# Patient Record
Sex: Female | Born: 1985 | Race: White | Hispanic: No | Marital: Single | Smoking: Never smoker
Health system: Southern US, Community
[De-identification: ages and names within clinical notes are randomized; demographics above are authoritative.]

## PROBLEM LIST (undated history)

## (undated) ENCOUNTER — Inpatient Hospital Stay (HOSPITAL_COMMUNITY): Payer: Self-pay

## (undated) DIAGNOSIS — I10 Essential (primary) hypertension: Secondary | ICD-10-CM

## (undated) DIAGNOSIS — R413 Other amnesia: Secondary | ICD-10-CM

## (undated) DIAGNOSIS — F419 Anxiety disorder, unspecified: Secondary | ICD-10-CM

## (undated) DIAGNOSIS — Q0701 Arnold-Chiari syndrome with spina bifida: Secondary | ICD-10-CM

## (undated) DIAGNOSIS — G479 Sleep disorder, unspecified: Secondary | ICD-10-CM

## (undated) DIAGNOSIS — F99 Mental disorder, not otherwise specified: Secondary | ICD-10-CM

## (undated) HISTORY — DX: Other amnesia: R41.3

## (undated) HISTORY — DX: Mental disorder, not otherwise specified: F99

## (undated) HISTORY — PX: MOUTH SURGERY: SHX715

## (undated) HISTORY — DX: Sleep disorder, unspecified: G47.9

## (undated) HISTORY — PX: KIDNEY SURGERY: SHX687

---

## 2003-09-22 ENCOUNTER — Emergency Department (HOSPITAL_COMMUNITY): Admission: EM | Admit: 2003-09-22 | Discharge: 2003-09-22 | Payer: Self-pay | Admitting: Emergency Medicine

## 2005-12-10 ENCOUNTER — Inpatient Hospital Stay (HOSPITAL_COMMUNITY): Admission: AD | Admit: 2005-12-10 | Discharge: 2005-12-12 | Payer: Self-pay | Admitting: Obstetrics & Gynecology

## 2005-12-11 ENCOUNTER — Encounter (INDEPENDENT_AMBULATORY_CARE_PROVIDER_SITE_OTHER): Payer: Self-pay | Admitting: Specialist

## 2005-12-25 ENCOUNTER — Emergency Department (HOSPITAL_COMMUNITY): Admission: EM | Admit: 2005-12-25 | Discharge: 2005-12-25 | Payer: Self-pay | Admitting: Emergency Medicine

## 2006-09-21 ENCOUNTER — Other Ambulatory Visit: Admission: RE | Admit: 2006-09-21 | Discharge: 2006-09-21 | Payer: Self-pay | Admitting: Obstetrics and Gynecology

## 2006-12-06 ENCOUNTER — Ambulatory Visit (HOSPITAL_COMMUNITY): Admission: RE | Admit: 2006-12-06 | Discharge: 2006-12-06 | Payer: Self-pay | Admitting: Obstetrics and Gynecology

## 2007-02-15 ENCOUNTER — Ambulatory Visit (HOSPITAL_COMMUNITY): Admission: RE | Admit: 2007-02-15 | Discharge: 2007-02-15 | Payer: Self-pay | Admitting: Obstetrics and Gynecology

## 2007-03-29 ENCOUNTER — Inpatient Hospital Stay (HOSPITAL_COMMUNITY): Admission: AD | Admit: 2007-03-29 | Discharge: 2007-03-29 | Payer: Self-pay | Admitting: Obstetrics and Gynecology

## 2007-04-19 ENCOUNTER — Ambulatory Visit (HOSPITAL_COMMUNITY): Admission: RE | Admit: 2007-04-19 | Discharge: 2007-04-19 | Payer: Self-pay | Admitting: Obstetrics and Gynecology

## 2007-04-21 ENCOUNTER — Inpatient Hospital Stay (HOSPITAL_COMMUNITY): Admission: AD | Admit: 2007-04-21 | Discharge: 2007-04-24 | Payer: Self-pay | Admitting: Obstetrics and Gynecology

## 2008-05-25 ENCOUNTER — Emergency Department (HOSPITAL_COMMUNITY): Admission: EM | Admit: 2008-05-25 | Discharge: 2008-05-25 | Payer: Self-pay | Admitting: Emergency Medicine

## 2008-12-01 ENCOUNTER — Emergency Department (HOSPITAL_COMMUNITY): Admission: EM | Admit: 2008-12-01 | Discharge: 2008-12-02 | Payer: Self-pay | Admitting: Emergency Medicine

## 2010-06-20 ENCOUNTER — Emergency Department (HOSPITAL_COMMUNITY): Payer: No Typology Code available for payment source

## 2010-06-20 ENCOUNTER — Emergency Department (HOSPITAL_COMMUNITY)
Admission: EM | Admit: 2010-06-20 | Discharge: 2010-06-20 | Disposition: A | Discharge status: Home / Self Care | Payer: No Typology Code available for payment source | Attending: Emergency Medicine | Admitting: Emergency Medicine

## 2010-06-20 DIAGNOSIS — S1093XA Contusion of unspecified part of neck, initial encounter: Secondary | ICD-10-CM | POA: Insufficient documentation

## 2010-06-20 DIAGNOSIS — M79609 Pain in unspecified limb: Secondary | ICD-10-CM | POA: Insufficient documentation

## 2010-06-20 DIAGNOSIS — M542 Cervicalgia: Secondary | ICD-10-CM | POA: Insufficient documentation

## 2010-06-20 DIAGNOSIS — M25519 Pain in unspecified shoulder: Secondary | ICD-10-CM | POA: Insufficient documentation

## 2010-06-20 DIAGNOSIS — Y92410 Unspecified street and highway as the place of occurrence of the external cause: Secondary | ICD-10-CM | POA: Insufficient documentation

## 2010-06-20 DIAGNOSIS — S0003XA Contusion of scalp, initial encounter: Secondary | ICD-10-CM | POA: Insufficient documentation

## 2010-07-09 LAB — URINALYSIS, ROUTINE W REFLEX MICROSCOPIC
Glucose, UA: NEGATIVE mg/dL
Hgb urine dipstick: NEGATIVE
Leukocytes, UA: NEGATIVE
pH: 6 (ref 5.0–8.0)

## 2010-07-09 LAB — BASIC METABOLIC PANEL
BUN: 10 mg/dL (ref 6–23)
CO2: 24 mEq/L (ref 19–32)
Calcium: 9.4 mg/dL (ref 8.4–10.5)
Creatinine, Ser: 0.93 mg/dL (ref 0.4–1.2)
GFR calc Af Amer: >60 mL/min (ref >60)
GFR calc non Af Amer: >60 mL/min (ref >60)
Glucose, Bld: 117 mg/dL — ABNORMAL HIGH (ref 70–99)
Potassium: 3.7 mEq/L (ref 3.5–5.1)

## 2010-07-09 LAB — CBC
RBC: 4.38 MIL/uL (ref 3.87–5.11)
WBC: 17.1 10*3/uL — ABNORMAL HIGH (ref 4.0–10.5)

## 2010-07-09 LAB — URINE MICROSCOPIC-ADD ON

## 2010-07-09 LAB — RAPID URINE DRUG SCREEN, HOSP PERFORMED
Amphetamines: NOT DETECTED
Barbiturates: NOT DETECTED
Benzodiazepines: NOT DETECTED
Cocaine: POSITIVE — AB

## 2010-07-09 LAB — DIFFERENTIAL
Monocytes Relative: 4 % (ref 3–12)
Neutro Abs: 15.3 10*3/uL — ABNORMAL HIGH (ref 1.7–7.7)

## 2010-07-09 LAB — POCT PREGNANCY, URINE: Preg Test, Ur: NEGATIVE

## 2010-11-22 ENCOUNTER — Other Ambulatory Visit: Payer: Self-pay | Admitting: Geriatric Medicine

## 2010-11-22 DIAGNOSIS — R102 Pelvic and perineal pain: Secondary | ICD-10-CM

## 2010-11-23 ENCOUNTER — Ambulatory Visit
Admission: RE | Admit: 2010-11-23 | Discharge: 2010-11-23 | Disposition: A | Discharge status: Home / Self Care | Payer: No Typology Code available for payment source | Source: Ambulatory Visit | Attending: Geriatric Medicine | Admitting: Geriatric Medicine

## 2010-11-23 ENCOUNTER — Other Ambulatory Visit: Payer: No Typology Code available for payment source

## 2010-11-23 ENCOUNTER — Ambulatory Visit
Admission: RE | Admit: 2010-11-23 | Discharge: 2010-11-23 | Disposition: A | Discharge status: Home / Self Care | Payer: Medicaid Other | Source: Ambulatory Visit | Attending: Geriatric Medicine | Admitting: Geriatric Medicine

## 2010-11-23 DIAGNOSIS — R102 Pelvic and perineal pain: Secondary | ICD-10-CM

## 2010-11-24 ENCOUNTER — Other Ambulatory Visit: Payer: No Typology Code available for payment source

## 2010-11-25 ENCOUNTER — Other Ambulatory Visit: Payer: No Typology Code available for payment source

## 2010-12-22 LAB — CBC
HCT: 35.2 — ABNORMAL LOW
HCT: 37.6
MCHC: 34.2
MCV: 97.9
Platelets: 220
RDW: 12.9

## 2011-04-09 ENCOUNTER — Emergency Department (HOSPITAL_COMMUNITY)
Admission: EM | Admit: 2011-04-09 | Discharge: 2011-04-09 | Disposition: A | Discharge status: Home / Self Care | Payer: Medicaid Other | Attending: Emergency Medicine | Admitting: Emergency Medicine

## 2011-04-09 DIAGNOSIS — M65849 Other synovitis and tenosynovitis, unspecified hand: Secondary | ICD-10-CM | POA: Insufficient documentation

## 2011-04-09 DIAGNOSIS — R6883 Chills (without fever): Secondary | ICD-10-CM | POA: Insufficient documentation

## 2011-04-09 DIAGNOSIS — M778 Other enthesopathies, not elsewhere classified: Secondary | ICD-10-CM

## 2011-04-09 DIAGNOSIS — F17200 Nicotine dependence, unspecified, uncomplicated: Secondary | ICD-10-CM | POA: Insufficient documentation

## 2011-04-09 DIAGNOSIS — M25539 Pain in unspecified wrist: Secondary | ICD-10-CM | POA: Insufficient documentation

## 2011-04-09 DIAGNOSIS — M65839 Other synovitis and tenosynovitis, unspecified forearm: Secondary | ICD-10-CM | POA: Insufficient documentation

## 2011-04-09 MED ORDER — PROMETHAZINE HCL 25 MG/ML IJ SOLN: 25.0000 mg | Freq: Once | INTRAMUSCULAR | Status: DC

## 2011-04-09 MED ORDER — KETOROLAC TROMETHAMINE 60 MG/2ML IM SOLN: 60.0000 mg | Freq: Once | INTRAMUSCULAR | Status: DC

## 2011-04-09 MED ORDER — OXYMETAZOLINE HCL 0.05 % NA SOLN: 2.0000 | Freq: Once | NASAL | Status: DC

## 2011-04-09 MED ORDER — PSEUDOEPHEDRINE HCL 60 MG PO TABS: 60.0000 mg | ORAL_TABLET | ORAL | Status: DC

## 2011-04-09 MED ORDER — NAPROXEN 500 MG PO TABS: 500.0000 mg | ORAL_TABLET | Freq: Two times a day (BID) | ORAL | Status: AC

## 2011-04-09 NOTE — ED Notes (Signed)
Pt in from home with stated left wrist pain denies recent injury states ongoing for several months no swelling noted

## 2011-04-09 NOTE — ED Notes (Signed)
Pt reports left wrist pain with extension. Pain increasing over a month. Pt reports picking up heavy items at work and had to leave early yesterday.  No memory of hurting wrist. Pt denies numbness. Pt denies taking medication for pain. Pt reports pain to dorsal hand and toward thumb.  No swelling at this time, but pt reports swelling is present on other days.

## 2011-04-09 NOTE — ED Provider Notes (Signed)
History     CSN: [id number]  Arrival date & time 04/09/11  1322   First MD Initiated Contact with Patient 04/09/11 1508      Chief Complaint  Patient presents with  . Wrist Pain    (Consider location/radiation/quality/duration/timing/severity/associated sxs/prior treatment) Patient is a 26 y.o. female presenting with wrist pain. The history is provided by the patient.  Wrist Pain This is a new problem. The current episode started 1 to 4 weeks ago. The problem occurs constantly. The problem has been gradually worsening. Associated symptoms include chills. Pertinent negatives include no abdominal pain, fever, joint swelling, myalgias, numbness or weakness. The symptoms are aggravated by bending and twisting. She has tried nothing for the symptoms.  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 States noted pain in her wrist about 2-3 weeks ago. States pain is worsening since then. Denies any known injury. States pain worsened with flexing wrist, extending wrist. NO swelling, redness noted.   History reviewed. No pertinent past medical history.  History reviewed. No pertinent past surgical history.  No family history on file.  History  Substance Use Topics  . Smoking status: Current Everyday Smoker  . Smokeless tobacco: Not on file  . Alcohol Use: No    OB History    Grav Para Term Preterm Abortions TAB SAB Ect Mult Living                  Review of Systems  Constitutional: Positive for chills. Negative for fever.  HENT: Negative.   Eyes: Negative.   Respiratory: Negative.   Cardiovascular: Negative.   Gastrointestinal: Negative for abdominal pain.  Genitourinary: Negative.   Musculoskeletal: Negative for myalgias and joint swelling.       Wrist pain  Skin: Negative.   Neurological: Negative for weakness and numbness.  Psychiatric/Behavioral: Negative.     Allergies  Review of patient's allergies indicates no known  allergies.  Home Medications  No current outpatient prescriptions on file.  BP 105/56  Pulse 97  Temp(Src) 99 F (37.2 C) (Oral)  Resp 20  SpO2 100%  LMP 04/09/2011  Physical Exam  Nursing note and vitals reviewed. Constitutional: She is oriented to person, place, and time. She appears well-nourished.  HENT:  Head: Normocephalic.  Eyes: Conjunctivae are normal.  Neck: Neck supple.  Cardiovascular: Normal rate, regular rhythm and normal heart sounds.   Pulmonary/Chest: Effort normal and breath sounds normal.  Musculoskeletal: She exhibits no edema.       Normal appearing left wrist. Tenderness with palpation over the dorsal wrist. Pain with flexion, extension of the wrist. Full ROM of the wrist. Normal strength. No pain with supination or pronation.   Neurological: She is alert and oriented to person, place, and time.  Skin: Skin is warm and dry.  Psychiatric: She has a normal mood and affect.    ED Course  Procedures (including critical care time)  Left wrist normal appearing, one area of tenderness over the dorsal wrist. Full ROM, no injury, no signs of infection. Do not think x-ray is helpful at this point. Suspect tendonitis vs gaglion cyst, which at this point i cannot feel. Will splint velcro, limit activity at home, ice, nsaids, follow up with hand ortho.   MDM          Lottie Mussel, PA 04/09/11 1541

## 2011-04-09 NOTE — ED Notes (Signed)
Ortho called for wrist splint 

## 2011-04-10 NOTE — ED Provider Notes (Signed)
Medical screening examination/treatment/procedure(s) were performed by non-physician practitioner and as supervising physician I was immediately available for consultation/collaboration.  Ezeriah Luty, MD 04/10/11 0118 

## 2011-11-26 ENCOUNTER — Encounter (HOSPITAL_COMMUNITY): Payer: Self-pay | Admitting: *Deleted

## 2011-11-26 ENCOUNTER — Emergency Department (HOSPITAL_COMMUNITY)
Admission: EM | Admit: 2011-11-26 | Discharge: 2011-11-27 | Disposition: A | Discharge status: Home / Self Care | Payer: Self-pay | Attending: Emergency Medicine | Admitting: Emergency Medicine

## 2011-11-26 DIAGNOSIS — R0602 Shortness of breath: Secondary | ICD-10-CM | POA: Insufficient documentation

## 2011-11-26 DIAGNOSIS — F17200 Nicotine dependence, unspecified, uncomplicated: Secondary | ICD-10-CM | POA: Insufficient documentation

## 2011-11-26 DIAGNOSIS — F411 Generalized anxiety disorder: Secondary | ICD-10-CM | POA: Insufficient documentation

## 2011-11-26 DIAGNOSIS — R079 Chest pain, unspecified: Secondary | ICD-10-CM | POA: Insufficient documentation

## 2011-11-26 HISTORY — DX: Anxiety disorder, unspecified: F41.9

## 2011-11-26 LAB — BASIC METABOLIC PANEL
Calcium: 9.2 mg/dL (ref 8.4–10.5)
Creatinine, Ser: 0.89 mg/dL (ref 0.50–1.10)
GFR calc Af Amer: >90 mL/min (ref >90)

## 2011-11-26 LAB — CBC
MCH: 32.7 pg (ref 26.0–34.0)
MCHC: 34 g/dL (ref 30.0–36.0)
MCV: 96.3 fL (ref 78.0–100.0)
Platelets: 283 10*3/uL (ref 150–400)
RDW: 11.9 % (ref 11.5–15.5)
WBC: 8.5 10*3/uL (ref 4.0–10.5)

## 2011-11-26 LAB — POCT I-STAT TROPONIN I: Troponin i, poc: 0 ng/mL (ref 0.00–0.08)

## 2011-11-26 NOTE — ED Notes (Signed)
Pt c/o shortness of breath and intermittent CP that resolved w/o intervention. Pt denies hx of lung and cardiac disease. Pt reports recently stopping anxiolytic due to auditory hallucinations.

## 2011-11-27 MED ORDER — ALPRAZOLAM 0.5 MG PO TABS: 0.5000 mg | ORAL_TABLET | Freq: Every evening | ORAL | Status: AC | PRN

## 2011-11-27 NOTE — ED Provider Notes (Signed)
History     CSN: [id number]  Arrival date & time 11/26/11  1946   First MD Initiated Contact with Patient 11/26/11 2355      Chief Complaint  Patient presents with  . Shortness of Breath    (Consider location/radiation/quality/duration/timing/severity/associated sxs/prior treatment) HPI Comments: 50 female with a history of anxiety who presents with a complaint of shortness of breath and chest pain. She states that she has been moving over the last several months and during this time as he multiple therapists and has been on different medications for anxiety most recently Zoloft. She states that she has been on this for 4 days after which time she developed auditory hallucinations prompting her to stop this medication. She has been off medication for several days and has had decreased hallucinations, no suicidal thoughts and no depression. Her anxiety has worsened over the last week with a stress of work and being a single parent. She states that around 6:00 PM she developed shortness of breath, sharp chest pain which has since essentially resolved. She has no travel, no trauma, no oral contraceptives or other hormone therapy, no swelling of the lower extremities, no fevers, no coughing, no back pain. Her symptoms at this time it resolved, she still has anxiety and feels nervous but denies depression or suicidal thoughts. She has no history of pulmonary motion, no family history of venous embolism.  Patient is a 26 y.o. female presenting with shortness of breath. The history is provided by the patient.  Shortness of Breath  Associated symptoms include shortness of breath.    Past Medical History  Diagnosis Date  . Anxiety     History reviewed. No pertinent past surgical history.  History reviewed. No pertinent family history.  History  Substance Use Topics  . Smoking status: Current Everyday Smoker    Types: Cigarettes  . Smokeless tobacco: Not on file  . Alcohol Use: No    OB  History    Grav Para Term Preterm Abortions TAB SAB Ect Mult Living                  Review of Systems  Respiratory: Positive for shortness of breath.   All other systems reviewed and are negative.    Allergies  Review of patient's allergies indicates no known allergies.  Home Medications   Current Outpatient Rx  Name Route Sig Dispense Refill  . ALPRAZOLAM 0.5 MG PO TABS Oral Take 1 tablet (0.5 mg total) by mouth at bedtime as needed for sleep. 5 tablet 0  . NAPROXEN 500 MG PO TABS Oral Take 1 tablet (500 mg total) by mouth 2 (two) times daily. 30 tablet 0    BP 102/71  Pulse 80  Temp 98.3 F (36.8 C) (Oral)  Resp 20  SpO2 100%  LMP 11/12/2011  Physical Exam  Nursing note and vitals reviewed. Constitutional: She appears well-developed and well-nourished. No distress.  HENT:  Head: Normocephalic and atraumatic.  Mouth/Throat: Oropharynx is clear and moist. No oropharyngeal exudate.  Eyes: Conjunctivae and EOM are normal. Pupils are equal, round, and reactive to light. Right eye exhibits no discharge. Left eye exhibits no discharge. No scleral icterus.  Neck: Normal range of motion. Neck supple. No JVD present. No thyromegaly present.  Cardiovascular: Normal rate, regular rhythm, normal heart sounds and intact distal pulses.  Exam reveals no gallop and no friction rub.   No murmur heard. Pulmonary/Chest: Effort normal and breath sounds normal. No respiratory distress. She has no wheezes.  She has no rales.  Abdominal: Soft. Bowel sounds are normal. She exhibits no distension and no mass. There is no tenderness.  Musculoskeletal: Normal range of motion. She exhibits no edema and no tenderness.  Lymphadenopathy:    She has no cervical adenopathy.  Neurological: She is alert. Coordination normal.  Skin: Skin is warm and dry. No rash noted. No erythema.  Psychiatric: Her behavior is normal.       Patient appears anxious, mildly tearful, denies depression or suicidal  thoughts    ED Course  Procedures (including critical care time)  Labs Reviewed  BASIC METABOLIC PANEL - Abnormal; Notable for the following:    Glucose, Bld 61 (*)     GFR calc non Af Amer 87 (*)     All other components within normal limits  CBC  POCT I-STAT TROPONIN I   No results found.   1. Shortness of breath   2. Chest pain       MDM  Strong peripheral pulses, clear heart and lung sounds, soft abdomen and no peripheral edema. Her pulse is 75 on my exam, she has a saturation of 100% an EKG without any abnormal significant findings. There is no old EKG with which to compare. She is extremely low risk for thromboembolism, her symptoms are likely related to her underlying behavioral health problems, she is amenable to referral both to community physicians and therapists and will give small amount of Xanax for home. The patient has verbally contracted with me take is a safe manner. She has no history of substance abuse and is not currently drink alcohol or use any drugs of abuse.   ED ECG REPORT  I personally interpreted this EKG   Date: 11/27/2011   Rate: 81  Rhythm: normal sinus rhythm  QRS Axis: right  Intervals: normal  ST/T Wave abnormalities: normal  Conduction Disutrbances:none  Narrative Interpretation:   Old EKG Reviewed: none available         Vida Roller, MD 11/27/11 0025

## 2011-11-27 NOTE — ED Notes (Signed)
Pt on cardiac monitor with continuous vital signs.

## 2011-11-27 NOTE — ED Notes (Signed)
MD EDP Hyacinth Meeker at bedside.

## 2011-11-27 NOTE — ED Notes (Signed)
Walked pt per Dr Hyacinth Meeker and then rechecked BP. WNL at this time.

## 2013-02-17 ENCOUNTER — Ambulatory Visit: Payer: Self-pay

## 2013-02-17 ENCOUNTER — Emergency Department (HOSPITAL_COMMUNITY)
Admission: EM | Admit: 2013-02-17 | Discharge: 2013-02-17 | Discharge status: Against Medical Advice | Payer: Medicaid Other

## 2013-02-17 NOTE — ED Notes (Signed)
Pt at wrong place, pt going to Dr. Annalee Genta

## 2013-06-03 ENCOUNTER — Encounter (HOSPITAL_COMMUNITY): Payer: Self-pay | Admitting: *Deleted

## 2013-06-03 ENCOUNTER — Inpatient Hospital Stay (HOSPITAL_COMMUNITY): Payer: Medicaid Other

## 2013-06-03 ENCOUNTER — Inpatient Hospital Stay (HOSPITAL_COMMUNITY)
Admission: AD | Admit: 2013-06-03 | Discharge: 2013-06-03 | Disposition: A | Discharge status: Home / Self Care | Payer: Medicaid Other | Source: Ambulatory Visit | Attending: Obstetrics and Gynecology | Admitting: Obstetrics and Gynecology

## 2013-06-03 DIAGNOSIS — B9689 Other specified bacterial agents as the cause of diseases classified elsewhere: Secondary | ICD-10-CM | POA: Insufficient documentation

## 2013-06-03 DIAGNOSIS — N760 Acute vaginitis: Secondary | ICD-10-CM | POA: Insufficient documentation

## 2013-06-03 DIAGNOSIS — O210 Mild hyperemesis gravidarum: Secondary | ICD-10-CM | POA: Insufficient documentation

## 2013-06-03 DIAGNOSIS — O4590 Premature separation of placenta, unspecified, unspecified trimester: Secondary | ICD-10-CM

## 2013-06-03 DIAGNOSIS — A499 Bacterial infection, unspecified: Secondary | ICD-10-CM | POA: Insufficient documentation

## 2013-06-03 DIAGNOSIS — O208 Other hemorrhage in early pregnancy: Secondary | ICD-10-CM | POA: Insufficient documentation

## 2013-06-03 DIAGNOSIS — O99330 Smoking (tobacco) complicating pregnancy, unspecified trimester: Secondary | ICD-10-CM | POA: Insufficient documentation

## 2013-06-03 DIAGNOSIS — O418X10 Other specified disorders of amniotic fluid and membranes, first trimester, not applicable or unspecified: Secondary | ICD-10-CM

## 2013-06-03 DIAGNOSIS — O2390 Unspecified genitourinary tract infection in pregnancy, unspecified trimester: Secondary | ICD-10-CM | POA: Insufficient documentation

## 2013-06-03 DIAGNOSIS — R109 Unspecified abdominal pain: Secondary | ICD-10-CM | POA: Insufficient documentation

## 2013-06-03 DIAGNOSIS — O468X1 Other antepartum hemorrhage, first trimester: Secondary | ICD-10-CM

## 2013-06-03 LAB — CBC
HEMATOCRIT: 37.1 % (ref 36.0–46.0)
HEMOGLOBIN: 13.2 g/dL (ref 12.0–15.0)
MCH: 32.8 pg (ref 26.0–34.0)
MCHC: 35.6 g/dL (ref 30.0–36.0)
MCV: 92.1 fL (ref 78.0–100.0)
Platelets: 250 10*3/uL (ref 150–400)
RBC: 4.03 MIL/uL (ref 3.87–5.11)
RDW: 11.7 % (ref 11.5–15.5)
WBC: 13.6 10*3/uL — ABNORMAL HIGH (ref 4.0–10.5)

## 2013-06-03 LAB — WET PREP, GENITAL
TRICH WET PREP: NONE SEEN
Yeast Wet Prep HPF POC: NONE SEEN

## 2013-06-03 LAB — URINE MICROSCOPIC-ADD ON

## 2013-06-03 LAB — URINALYSIS, ROUTINE W REFLEX MICROSCOPIC
Bilirubin Urine: NEGATIVE
Glucose, UA: NEGATIVE mg/dL
KETONES UR: NEGATIVE mg/dL
LEUKOCYTES UA: NEGATIVE
NITRITE: NEGATIVE
PH: 5.5 (ref 5.0–8.0)
Protein, ur: NEGATIVE mg/dL
Urobilinogen, UA: 0.2 mg/dL (ref 0.0–1.0)

## 2013-06-03 LAB — POCT PREGNANCY, URINE: PREG TEST UR: POSITIVE — AB

## 2013-06-03 LAB — HCG, QUANTITATIVE, PREGNANCY: HCG, BETA CHAIN, QUANT, S: 55815 m[IU]/mL — AB (ref <5)

## 2013-06-03 MED ORDER — METRONIDAZOLE 500 MG PO TABS: 500.0000 mg | ORAL_TABLET | Freq: Two times a day (BID) | ORAL | Status: DC

## 2013-06-03 NOTE — MAU Note (Signed)
Bleeding for the last month, intermittent light & heavy.  +HPT over a month ago.  Lower abd cramping.

## 2013-06-03 NOTE — Discharge Instructions (Signed)
Bacterial Vaginosis Bacterial vaginosis is a vaginal infection that occurs when the normal balance of bacteria in the vagina is disrupted. It results from an overgrowth of certain bacteria. This is the most common vaginal infection in women of childbearing age. Treatment is important to prevent complications, especially in pregnant women, as it can cause a premature delivery. CAUSES  Bacterial vaginosis is caused by an increase in harmful bacteria that are normally present in smaller amounts in the vagina. Several different kinds of bacteria can cause bacterial vaginosis. However, the reason that the condition develops is not fully understood. RISK FACTORS Certain activities or behaviors can put you at an increased risk of developing bacterial vaginosis, including:  Having a new sex partner or multiple sex partners.  Douching.  Using an intrauterine device (IUD) for contraception. Women do not get bacterial vaginosis from toilet seats, bedding, swimming pools, or contact with objects around them. SIGNS AND SYMPTOMS  Some women with bacterial vaginosis have no signs or symptoms. Common symptoms include:  Grey vaginal discharge.  A fishlike odor with discharge, especially after sexual intercourse.  Itching or burning of the vagina and vulva.  Burning or pain with urination. DIAGNOSIS  Your health care provider will take a medical history and examine the vagina for signs of bacterial vaginosis. A sample of vaginal fluid may be taken. Your health care provider will look at this sample under a microscope to check for bacteria and abnormal cells. A vaginal pH test may also be done.  TREATMENT  Bacterial vaginosis may be treated with antibiotic medicines. These may be given in the form of a pill or a vaginal cream. A second round of antibiotics may be prescribed if the condition comes back after treatment.  HOME CARE INSTRUCTIONS   Only take over-the-counter or prescription medicines as  directed by your health care provider.  If antibiotic medicine was prescribed, take it as directed. Make sure you finish it even if you start to feel better.  Do not have sex until treatment is completed.  Tell all sexual partners that you have a vaginal infection. They should see their health care provider and be treated if they have problems, such as a mild rash or itching.  Practice safe sex by using condoms and only having one sex partner. SEEK MEDICAL CARE IF:   Your symptoms are not improving after 3 days of treatment.  You have increased discharge or pain.  You have a fever. MAKE SURE YOU:   Understand these instructions.  Will watch your condition.  Will get help right away if you are not doing well or get worse. FOR MORE INFORMATION  Centers for Disease Control and Prevention, Division of STD Prevention: [employer]Apps.co.za American Sexual Health Association (ASHA): www.ashastd.org  Document Released: 03/20/2005 Document Revised: 01/08/2013 Document Reviewed: 10/30/2012 [facility] Patient Information 2014 [place], [place]. Subchorionic Hematoma A subchorionic hematoma is a gathering of blood between the outer wall of the placenta and the inner wall of the womb (uterus). The placenta is the organ that connects the fetus to the wall of the uterus. The placenta performs the feeding, breathing (oxygen to the fetus), and waste removal (excretory work) of the fetus.  Subchorionic hematoma is the most common abnormality found on a result from ultrasonography done during the first trimester or early second trimester of pregnancy. If there has been little or no vaginal bleeding, early small hematomas usually shrink on their own and do not affect your baby or pregnancy. The blood is gradually absorbed over  1 2 weeks. When bleeding starts later in pregnancy or the hematoma is larger or occurs in an older pregnant woman, the outcome may not be as good. Larger hematomas may get bigger, which  increases the chances for miscarriage. Subchorionic hematoma also increases the risk of premature detachment of the placenta from the uterus, preterm (premature) labor, and stillbirth. HOME CARE INSTRUCTIONS   Stay on bed rest if your health care provider recommends this. Although bed rest will not prevent more bleeding or prevent a miscarriage, your health care provider may recommend bed rest until you are advised otherwise.  Avoid heavy lifting (more than 10 lb [4.5 kg]), exercise, sexual intercourse, or douching as directed by your health care provider.  Keep track of the number of pads you use each day and how soaked (saturated) they are. Write down this information.  Do not use tampons.  Keep all follow-up appointments as directed by your health care provider. Your health care provider may ask you to have follow-up blood tests or ultrasound tests or both. SEEK IMMEDIATE MEDICAL CARE IF:   You have severe cramps in your stomach, back, abdomen, or pelvis.  You have a fever.  You pass large clots or tissue. Save any tissue for your health care provider to look at.  Your bleeding increases or you become lightheaded, feel weak, or have fainting episodes. Document Released: 07/05/2006 Document Revised: 01/08/2013 Document Reviewed: 10/17/2012 [facility] Patient Information 2014 [place].

## 2013-06-03 NOTE — MAU Provider Note (Signed)
History     CSN: [id number]  Arrival date and time: 06/03/13 1806   First Provider Initiated Contact with Patient 06/03/13 1850      Chief Complaint  Patient presents with  . Vaginal Bleeding  . Abdominal Pain   HPI [name] is a 28 y.o. G3P1011 at [redacted]w[redacted]d who presents to MAU today with complaint of +HPT and vaginal bleeding x 1 month. The patient states that her last normal period was in December. She states that she has had bleeding off and on since late January. She states that bleeding is scant now. She has had N/V occasionally. She denies abdominal pain, fever, diarrhea, constipation or UTI symptoms. She has not started care for this pregnancy yet.   OB History   Grav Para Term Preterm Abortions TAB SAB Ect Mult Living   3 1 1  1  1   1       Past Medical History  Diagnosis Date  . Anxiety     History reviewed. No pertinent past surgical history.  History reviewed. No pertinent family history.  History  Substance Use Topics  . Smoking status: Current Every Day Smoker    Types: Cigarettes  . Smokeless tobacco: Not on file  . Alcohol Use: No    Allergies: No Known Allergies  No prescriptions prior to admission    Review of Systems  Constitutional: Negative for fever and malaise/fatigue.  Gastrointestinal: Positive for nausea and vomiting. Negative for abdominal pain, diarrhea and constipation.  Genitourinary: Negative for dysuria and urgency.       + vaginal bleeding  Neurological: Negative for dizziness, loss of consciousness and weakness.   Physical Exam   Blood pressure 127/75, pulse 106, temperature 98.1 F (36.7 C), resp. rate 16.  Physical Exam  Constitutional: She is oriented to person, place, and time. She appears well-developed and well-nourished. No distress.  HENT:  Head: Normocephalic and atraumatic.  Cardiovascular: Normal rate, regular rhythm and normal heart sounds.   Respiratory: Effort normal and breath sounds normal. No  respiratory distress.  GI: Soft. Bowel sounds are normal. She exhibits no distension and no mass. There is no tenderness. There is no rebound and no guarding.  Genitourinary: Uterus is not enlarged and not tender. Cervix exhibits no motion tenderness, no discharge and no friability. Right adnexum displays no mass and no tenderness. Left adnexum displays no mass and no tenderness. There is bleeding (scant) around the vagina. Vaginal discharge (scant thin, light brown discharge noted) found.  Neurological: She is alert and oriented to person, place, and time.  Skin: Skin is warm and dry. No erythema.  Psychiatric: She has a normal mood and affect.   Results for orders placed during the hospital encounter of 06/03/13 (from the past 24 hour(s))  URINALYSIS, ROUTINE W REFLEX MICROSCOPIC     Status: Abnormal   Collection Time    06/03/13  6:25 PM      Result Value Ref Range   Color, Urine YELLOW  YELLOW   APPearance CLEAR  CLEAR   Specific Gravity, Urine >1.030 (*) 1.005 - 1.030   pH 5.5  5.0 - 8.0   Glucose, UA NEGATIVE  NEGATIVE mg/dL   Hgb urine dipstick SMALL (*) NEGATIVE   Bilirubin Urine NEGATIVE  NEGATIVE   Ketones, ur NEGATIVE  NEGATIVE mg/dL   Protein, ur NEGATIVE  NEGATIVE mg/dL   Urobilinogen, UA 0.2  0.0 - 1.0 mg/dL   Nitrite NEGATIVE  NEGATIVE   Leukocytes, UA NEGATIVE  NEGATIVE  URINE MICROSCOPIC-ADD ON     Status: Abnormal   Collection Time    06/03/13  6:25 PM      Result Value Ref Range   Squamous Epithelial / LPF FEW (*) RARE   WBC, UA 0-2  <3 WBC/hpf   RBC / HPF 0-2  <3 RBC/hpf   Bacteria, UA FEW (*) RARE   Urine-Other MUCOUS PRESENT    POCT PREGNANCY, URINE     Status: Abnormal   Collection Time    06/03/13  6:42 PM      Result Value Ref Range   Preg Test, Ur POSITIVE (*) NEGATIVE  WET PREP, GENITAL     Status: Abnormal   Collection Time    06/03/13  7:11 PM      Result Value Ref Range   Yeast Wet Prep HPF POC NONE SEEN  NONE SEEN   Trich, Wet Prep NONE SEEN   NONE SEEN   Clue Cells Wet Prep HPF POC MODERATE (*) NONE SEEN   WBC, Wet Prep HPF POC MODERATE (*) NONE SEEN  ABO/RH     Status: None   Collection Time    06/03/13  7:31 PM      Result Value Ref Range   ABO/RH(D) O POS    CBC     Status: Abnormal   Collection Time    06/03/13  7:32 PM      Result Value Ref Range   WBC 13.6 (*) 4.0 - 10.5 K/uL   RBC 4.03  3.87 - 5.11 MIL/uL   Hemoglobin 13.2  12.0 - 15.0 g/dL   HCT 46.937.1  62.936.0 - 52.846.0 %   MCV 92.1  78.0 - 100.0 fL   MCH 32.8  26.0 - 34.0 pg   MCHC 35.6  30.0 - 36.0 g/dL   RDW 41.311.7  24.411.5 - 01.015.5 %   Platelets 250  150 - 400 K/uL  HCG, QUANTITATIVE, PREGNANCY     Status: Abnormal   Collection Time    06/03/13  7:32 PM      Result Value Ref Range   hCG, Beta Chain, Quant, S [id number] (*) <5 mIU/mL    [place] Ob Comp Less 14 Wks  06/03/2013   CLINICAL DATA:  Vaginal bleeding.  EXAM: OBSTETRIC <14 WK ULTRASOUND  TECHNIQUE: Transabdominal ultrasound was performed for evaluation of the gestation as well as the maternal uterus and adnexal regions.  COMPARISON:  None.  FINDINGS: Intrauterine gestational sac: Visualized/normal in shape.  Yolk sac:  Present  Embryo:  Present  Cardiac Activity: Present  Heart Rate: 169 bpm  MSD:   mm    w     d  CRL:   37.1  mm   10 w 4 d                  US EDC: 12/26/2013  Maternal uterus/adnexae:  Small subcortical hemorrhage measuring 1.6 x 0.4 x 1.3 cm  Normal ovaries.  Right-sided corpus luteum cyst noted.  No free pelvic fluid collections.  IMPRESSION: Single living intrauterine fetus estimated at 10 weeks and 4 days gestation.  Small subcortical hemorrhage.   Electronically Signed   By: Loralie ChampagneMark  Gallerani M.D.   On: 06/03/2013 19:53    MAU Course  Procedures None  MDM + UPT UA, wet prep, GC/Chlamydia, CBC, ABO/Rh, quant hCG and US today  Assessment and Plan  A: Small subchorionic hemorrhage  Bacterial vaginosis  P: Discharge home Rx for Flagyl sent to patient's pharmacy  Bleeding precautions Pelvic rest  advised Patient advised to make an appointment to start prenatal care with [facility] ASAP Patient may return to MAU as needed or if her condition were to change or worsen  Freddi Starr, PA-C  06/03/2013, 8:36 PM

## 2013-06-04 LAB — GC/CHLAMYDIA PROBE AMP
CT Probe RNA: NEGATIVE
GC PROBE AMP APTIMA: NEGATIVE

## 2013-06-04 LAB — ABO/RH: ABO/RH(D): O POS

## 2013-06-09 NOTE — MAU Provider Note (Signed)
Attestation of Attending Supervision of Advanced Practitioner (CNM/NP): Evaluation and management procedures were performed by the Advanced Practitioner under my supervision and collaboration.  I have reviewed the Advanced Practitioner's note and chart, and I agree with the management and plan.  Messiyah Waterson 06/09/2013 8:46 AM

## 2013-08-12 ENCOUNTER — Ambulatory Visit (INDEPENDENT_AMBULATORY_CARE_PROVIDER_SITE_OTHER): Payer: Medicaid Other | Admitting: Diagnostic Neuroimaging

## 2013-08-12 ENCOUNTER — Encounter: Payer: Self-pay | Admitting: Diagnostic Neuroimaging

## 2013-08-12 VITALS — BP 110/77 | HR 74 | Ht 62.0 in | Wt 112.5 lb

## 2013-08-12 DIAGNOSIS — R413 Other amnesia: Secondary | ICD-10-CM

## 2013-08-12 DIAGNOSIS — F3289 Other specified depressive episodes: Secondary | ICD-10-CM

## 2013-08-12 DIAGNOSIS — F32A Depression, unspecified: Secondary | ICD-10-CM

## 2013-08-12 DIAGNOSIS — G4700 Insomnia, unspecified: Secondary | ICD-10-CM

## 2013-08-12 DIAGNOSIS — F329 Major depressive disorder, single episode, unspecified: Secondary | ICD-10-CM

## 2013-08-12 DIAGNOSIS — F411 Generalized anxiety disorder: Secondary | ICD-10-CM
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
## 2013-08-12 NOTE — Patient Instructions (Signed)
I will check MRI scan.

## 2013-08-20 ENCOUNTER — Other Ambulatory Visit: Payer: Medicaid Other

## 2013-08-21 ENCOUNTER — Other Ambulatory Visit: Payer: Medicaid Other

## 2013-08-26 ENCOUNTER — Other Ambulatory Visit: Payer: Medicaid Other

## 2013-08-28 ENCOUNTER — Ambulatory Visit
Admission: RE | Admit: 2013-08-28 | Discharge: 2013-08-28 | Disposition: A | Discharge status: Home / Self Care | Payer: Medicaid Other | Source: Ambulatory Visit | Attending: Diagnostic Neuroimaging | Admitting: Diagnostic Neuroimaging

## 2013-08-28 DIAGNOSIS — R413 Other amnesia: Secondary | ICD-10-CM

## 2013-08-28 DIAGNOSIS — F329 Major depressive disorder, single episode, unspecified: Secondary | ICD-10-CM

## 2013-08-28 DIAGNOSIS — F411 Generalized anxiety disorder: Secondary | ICD-10-CM

## 2013-08-28 DIAGNOSIS — F32A Depression, unspecified: Secondary | ICD-10-CM

## 2013-08-28 DIAGNOSIS — G4700 Insomnia, unspecified: Secondary | ICD-10-CM

## 2013-08-29 ENCOUNTER — Other Ambulatory Visit: Payer: Medicaid Other

## 2013-09-01 ENCOUNTER — Telehealth: Payer: Self-pay | Admitting: Diagnostic Neuroimaging

## 2013-09-01 NOTE — Telephone Encounter (Signed)
Called Deborah Park and left message informing her that her MRI results were not in yet and as soon as the Dr. Marjory Lies gets the results, someone would be giving her a call back. I advised the Deborah Park that if she has any other problems, questions or concerns to call the office.

## 2013-09-01 NOTE — Telephone Encounter (Signed)
Patient requesting results of recent imaging testing. Please call to advise.

## 2013-09-04 NOTE — Telephone Encounter (Signed)
Pt calling back for MRI results please call

## 2013-09-04 NOTE — Telephone Encounter (Signed)
Pt calling requesting MRI results. Dr. Marjory Lies is out of the office, sending to [facility], Dr. Pearlean Brownie. Please advise

## 2013-09-05 NOTE — Telephone Encounter (Signed)
Called her and left message on answering machine that brain MRI is normal but has mild incidental abnormality. She can discuss this with Dr Marjory Lies next week.

## 2013-09-08 ENCOUNTER — Telehealth: Payer: Self-pay | Admitting: Diagnostic Neuroimaging

## 2013-09-08 DIAGNOSIS — R413 Other amnesia: Secondary | ICD-10-CM

## 2013-09-08 DIAGNOSIS — R5383 Other fatigue: Secondary | ICD-10-CM

## 2013-09-12 ENCOUNTER — Telehealth: Payer: Self-pay | Admitting: Neurology

## 2013-09-12 NOTE — Telephone Encounter (Signed)
Please arrange for sleep consultation, as I need more information. thx Huston FoleySaima Kimberly Nieland, MD, PhD [facility])

## 2013-09-12 NOTE — Telephone Encounter (Signed)
I called patient. MRI unremarkable. Will proceed with sleep study. -VRP
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
## 2013-09-17 ENCOUNTER — Encounter: Payer: Self-pay | Admitting: Neurology

## 2013-09-17 ENCOUNTER — Ambulatory Visit (INDEPENDENT_AMBULATORY_CARE_PROVIDER_SITE_OTHER): Payer: Medicaid Other | Admitting: Neurology

## 2013-09-17 VITALS — BP 105/62 | HR 76 | Temp 99.6°F | Ht 62.0 in | Wt 114.0 lb

## 2013-09-17 DIAGNOSIS — F411 Generalized anxiety disorder: Secondary | ICD-10-CM

## 2013-09-17 DIAGNOSIS — F3289 Other specified depressive episodes: Secondary | ICD-10-CM

## 2013-09-17 DIAGNOSIS — G479 Sleep disorder, unspecified: Secondary | ICD-10-CM

## 2013-09-17 DIAGNOSIS — R413 Other amnesia: Secondary | ICD-10-CM | POA: Insufficient documentation

## 2013-09-17 DIAGNOSIS — G4750 Parasomnia, unspecified: Secondary | ICD-10-CM

## 2013-09-17 DIAGNOSIS — F32A Depression, unspecified: Secondary | ICD-10-CM

## 2013-09-17 DIAGNOSIS — IMO0002 Reserved for concepts with insufficient information to code with codable children: Secondary | ICD-10-CM

## 2013-09-17 DIAGNOSIS — G4710 Hypersomnia, unspecified: Secondary | ICD-10-CM

## 2013-09-17 DIAGNOSIS — F329 Major depressive disorder, single episode, unspecified: Secondary | ICD-10-CM

## 2013-09-17 HISTORY — DX: Sleep disorder, unspecified: G47.9

## 2013-09-17 HISTORY — DX: Other amnesia: R41.3

## 2013-09-17 NOTE — Patient Instructions (Addendum)
Please talk to Dr. Deatra RobinsonKaren Jones about tapering off the lexapro in preparation of your sleep studies. You should be off of lexapro for at least 2 weeks prior to testing. You should try to avoid trazodone as well.   I believe you have a condition that manifests with excessive sleep and excessive sleepiness at night mainly. We will do a night time sleep test and daytime sleep testing. After the sleep study, we may have to try different medications that may help you stay awake during the day. Not everything works with everybody the same way. Wake promoting agents include stimulants and non-stimulant type medications. The most common side effects with stimulants are weight loss, insomnia, nervousness, headaches, palpitations, rise in blood pressure, anxiety. Stimulants can be addictive and subject to abuse. Non-stimulant type wake promoting medications include Provigil and Nuvigil, most common side effects include headaches, nervousness, insomnia, hypertension.    I will see you back after the sleep studies are done and I have had a chance to read your studies.   Please stop smoking!

## 2013-09-17 NOTE — Progress Notes (Signed)
Subjective:    Patient ID: NAYLAH CORK is a 28 y.o. female.  HPI    Huston Foley, MD, PhD [facility] [address] [address] [place], [place] [address]  Dear Satira Sark,   I saw patient, Lyvia Mondesir, upon your kind request in my clinic today for initial consultation of her sleep problems. The patient is accompanied by her six-year-old son today. As you know, Ms. Ruffner is a 28 year old right-handed woman with a medical history of anxiety, and depression, who recently saw you for memory loss in the context of to prior remote episodes of head trauma. She reports significant daytime somnolence and prolonged sleep with inability to wake up easily. She may sleep up to 12 and even 24 hours at a time. She does not feel refreshed after waking up. She has had some trouble going to sleep and has tried Ambien in November of last year without benefit. She has been on trazodone and Lexapro which have helped her sleep initiation problems. Her sleep issues have been ongoing for 5-6 years, perhaps since she had her son, who is now 48. She works as a Garment/textile technologist. She has been late to work often, but does not fall asleep at work. She has had a hard time getting up in the morning to help get her son ready and he has been late for school and even absent. She has tried many alarms and has had family and friends call to help her wake up, but nothing has been successful. She has vivid dreams and has frequent recollection of her dreams; she does dream a lot, but not necessarily in her naps. She has been told that she talks often in her sleep, especially in her naps. Her sleepiness pattern is difficult to describe for her. She can sleep for more than 12 hours 4 days a week, but sometimes up to 24 hours. She has woken up very hungry and thirsty after sleeping for prolonged period of time. She has also been known to walk in her sleep. She has held full conversations during the day and  sometimes has no recollection of those. Thankfully her work environment is quite forgiving. She has been late to work many a time. She has not fallen asleep driving. She is in obvious distress about her sleep disturbance but has not pursued help before because she did not know how to go about it and she also attributed some of her symptoms 2 underlying depression. She feels she's not very difference however. She has been on low dose Lexapro. She has never been suicidal. She can come off of Lexapro she feels. She has only started the trazodone after she noted that she could not fall asleep after she started the Lexapro. She is taking 25 mg of trazodone. Vistaril has been very helpful for her anxiety. She smokes and is not currently actively trying to quit smoking. She does not drink any alcohol. She had a miscarriage a couple of months ago. She is currently not pregnant and is not planning to get pregnant. She had a brain MRI without contrast on 08/28/2013 which I reviewed. It did not show any abnormality other than borderline low lying cerebellar tonsils at 3-4 mm not enough to be called Chiari malformation. Otherwise normal scan. She does not drink caffeine on a daily basis. She has a TV in her bedroom but does not rely on watching at night. She grew up in a bedroom that had TV on all night.  She reports that this was a hard habit to break. She denies any restless leg symptoms and is not known to kick in her sleep. She denies sleep paralysis, cataplexy, hypnagogic or hypnopompic hallucinations. There is no family history of sleepiness disorder such as narcolepsy or idiopathic hypersomnolence or significant sleep apnea. There is some family history of insomnia. Her ESS is 8/24 today.   Her Past Medical History Is Significant For: Past Medical History  Diagnosis Date  . Anxiety   . Memory loss 09/17/2013  . Sleep disturbance 09/17/2013    Her Past Surgical History Is Significant For: History reviewed. No  pertinent past surgical history.  Her Family History Is Significant For: Family History  Problem Relation Age of Onset  . Healthy Mother   . Healthy Father   . Bipolar disorder Maternal Grandmother   . Seizures Neg Hx   . Parkinsonism Neg Hx   . Multiple sclerosis Neg Hx   . Dementia Neg Hx     Her Social History Is Significant For: History   Social History  . Marital Status: Single    Spouse Name: N/A    Number of Children: 1  . Years of Education: College   Occupational History  .      [employer]   Social History Main Topics  . Smoking status: Current Every Day Smoker -- 1.00 packs/day for 15 years    Types: Cigarettes  . Smokeless tobacco: Never Used  . Alcohol Use: No  . Drug Use: No  . Sexual Activity: Yes    Birth Control/ Protection: None   Other Topics Concern  . None   Social History Narrative   Patient lives at home with son.   Caffeine use; 1-3 cups daily    Her Allergies Are:  No Known Allergies:   Her Current Medications Are:  Outpatient Encounter Prescriptions as of 09/17/2013  Medication Sig  . escitalopram (LEXAPRO) 10 MG tablet Take 10 mg by mouth daily.  . hydrOXYzine (VISTARIL) 25 MG capsule Take 25 mg by mouth every 6 (six) hours as needed.  . traZODone (DESYREL) 50 MG tablet Take 25 mg by mouth at bedtime.  :  Review of Systems:  Out of a complete 14 point review of systems, all are reviewed and negative with the exception of these symptoms as listed below:  Review of Systems  Constitutional: Negative.   HENT: Negative.   Eyes: Negative.   Respiratory: Negative.   Cardiovascular: Negative.   Gastrointestinal: Negative.   Endocrine: Negative.   Genitourinary: Negative.   Musculoskeletal: Negative.   Skin: Negative.   Allergic/Immunologic: Negative.   Neurological:       Memory loss  Hematological: Negative.   Psychiatric/Behavioral: Positive for sleep disturbance (insomnia, e.d.s., sleep talking, sleep walking,  acting-out dreams).    Objective:  Neurologic Exam  Physical Exam Physical Examination:   Filed Vitals:   09/17/13 0958  BP: 105/62  Pulse: 76  Temp: 99.6 F (37.6 C)    General Examination: The patient is a very pleasant 28 y.o. female in no acute distress. She appears well-developed and well-nourished and very well groomed. She is mildly anxious appearing and very slender.  HEENT: Normocephalic, atraumatic, pupils are equal, round and reactive to light and accommodation. Funduscopic exam is normal with sharp disc margins noted. Extraocular tracking is good without limitation to gaze excursion or nystagmus noted. Normal smooth pursuit is noted. Hearing is grossly intact. Tympanic membranes are clear bilaterally. Face is symmetric with normal facial  animation and normal facial sensation. Speech is clear with no dysarthria noted. There is no hypophonia. There is no lip, neck/head, jaw or voice tremor. Neck is supple with full range of passive and active motion. There are no carotid bruits on auscultation. Oropharynx exam reveals: mild mouth dryness, good dental hygiene and no significant airway crowding. Mallampati is class I. Tongue protrudes centrally and palate elevates symmetrically. Tonsils are small. Neck size is slender.   Chest: Clear to auscultation without wheezing, rhonchi or crackles noted.  Heart: S1+S2+0, regular and normal without murmurs, rubs or gallops noted.   Abdomen: Soft, non-tender and non-distended with normal bowel sounds appreciated on auscultation.  Extremities: There is no pitting edema in the distal lower extremities bilaterally. Pedal pulses are intact.  Skin: Warm and dry without trophic changes noted. There are no varicose veins.  Musculoskeletal: exam reveals no obvious joint deformities, tenderness or joint swelling or erythema.   Neurologically:  Mental status: The patient is awake, alert and oriented in all 4 spheres. Her immediate and remote  memory, attention, language skills and fund of knowledge are appropriate. There is no evidence of aphasia, agnosia, apraxia or anomia. Speech is clear with normal prosody and enunciation. Thought process is linear. Mood is normal and affect is normal.  Cranial nerves II - XII are as described above under HEENT exam. In addition: shoulder shrug is normal with equal shoulder height noted. Motor exam: Normal bulk, strength and tone is noted. There is no drift, tremor or rebound. Romberg is negative. Reflexes are 2+ throughout. Babinski: Toes are flexor bilaterally. Fine motor skills and coordination: intact with normal finger taps, normal hand movements, normal rapid alternating patting, normal foot taps and normal foot agility.  Cerebellar testing: No dysmetria or intention tremor on finger to nose testing. Heel to shin is unremarkable bilaterally. There is no truncal or gait ataxia.  Sensory exam: intact to light touch, pinprick, vibration, temperature sense in the upper and lower extremities.  Gait, station and balance: She stands easily. No veering to one side is noted. No leaning to one side is noted. Posture is age-appropriate and stance is narrow based. Gait shows normal stride length and normal pace. No problems turning are noted. She turns en bloc. Tandem walk is unremarkable. Intact toe and heel stance is noted.               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 While she does not have a telltale history for narcolepsy, there is concern for a sleep disorder with significant hypersomnolence. Differential diagnosis includes idiopathic hypersomnolence, and periodic hypersomnolence. She does not have a physical exam are history concerning for sleep disordered breathing. She denies RLS symptoms. She has noted insomnia primarily in the context of starting  Lexapro. Not being able to go to sleep as not her biggest problem. Her biggest issue is not being able to wake up and sleeping too long and too much. I had a long chat with the patient about her symptoms and potential differential diagnosis. We talked about medical treatments and non-pharmacological approaches for hypersomnolence disorders. I would like to proceed with an overnight sleep test as well as a daytime nap test. She is on Lexapro 10 mg strength and is willing to taper off of it. She is going to see her psychiatrist later this week. She denies any severe  depression and has never had any suicidal ideations. She is only on low-dose trazodone while she has been on Lexapro and should be able to come off of it as well. I don't think that narcolepsy can be completely excluded at this moment, even though she does not endorse cataplexy, sleep paralysis or hallucinations. At this juncture, I will order sleep testing and see her back afterwards. We talked about symptomatic treatment for hypersomnolence disorders in general. Options include stimulant and non-stimulant type medications. Because of her low weight I would be reluctant to consider a stimulant type medication. She may be a candidate for Provigil or Nuvigil. We will pick up our discussion after we have completed her sleep tests. Her physical exam is nonfocal. Her brain MRI was reassuring. We talked about the test result.  Thank you very much for allowing me to participate in the care of this nice patient. If I can be of any further assistance to you please do not hesitate to contact me.  Sincerely,   Huston Foley, MD, PhD

## 2013-10-07 ENCOUNTER — Ambulatory Visit (INDEPENDENT_AMBULATORY_CARE_PROVIDER_SITE_OTHER): Payer: Medicaid Other | Admitting: Neurology

## 2013-10-07 DIAGNOSIS — R0609 Other forms of dyspnea: Secondary | ICD-10-CM

## 2013-10-07 DIAGNOSIS — G4761 Periodic limb movement disorder: Secondary | ICD-10-CM

## 2013-10-07 DIAGNOSIS — G479 Sleep disorder, unspecified: Secondary | ICD-10-CM

## 2013-10-07 DIAGNOSIS — R0989 Other specified symptoms and signs involving the circulatory and respiratory systems: Secondary | ICD-10-CM

## 2013-10-07 DIAGNOSIS — R413 Other amnesia: Secondary | ICD-10-CM

## 2013-10-07 DIAGNOSIS — G47419 Narcolepsy without cataplexy: Secondary | ICD-10-CM

## 2013-10-07 DIAGNOSIS — G4710 Hypersomnia, unspecified: Secondary | ICD-10-CM

## 2013-10-08 ENCOUNTER — Encounter: Payer: Self-pay | Admitting: Neurology

## 2013-10-08 DIAGNOSIS — R0989 Other specified symptoms and signs involving the circulatory and respiratory systems: Secondary | ICD-10-CM

## 2013-10-08 DIAGNOSIS — G47419 Narcolepsy without cataplexy: Secondary | ICD-10-CM

## 2013-10-08 DIAGNOSIS — R0609 Other forms of dyspnea: Secondary | ICD-10-CM

## 2013-10-08 DIAGNOSIS — G4761 Periodic limb movement disorder: Secondary | ICD-10-CM

## 2013-10-08 NOTE — Sleep Study (Signed)
MS[facility] Nap Questionnaire  PRE NAP SLEEPINESS SCALE 1-7  1 - Feeling Active and vital, alert, wide awake  2 - Functioning at a high level, but not at peak, able to concentrate  3 - Relaxed and awake, not at full alertness, responsive  4- A little foggy, let down  5 - Fogginess, beginning to lose interest in remaining awake  6 - Sleepiness, prefer to be lying down, fighting sleep, woozy  7 - Almost in reverie, sleep onset soon, losing struggle to remain awake  =============================================================== Nap #1 Pre nap level of sleepiness:  6/7 Lights out time:  8:16  Comments:  Post Nap Questions for nap #1:  Did you sleep?  I don't know  Did you dream? Maybe  ================================================================ Nap #2 Pre nap level of sleepiness:  5/7 Lights out time:  10:15  Comments:  Post Nap Questions for nap #2:  Did you sleep?  I don't know  Did you dream? No ================================================================ Nap #3 Pre nap level of sleepiness:  4/7 Lights out time: 12:15  Comments:  Post Nap Questions for Nap #3:  Did you sleep?  I think I did  Did you dream?  Yes  =============================================================== Nap #4 Pre nap level of sleepiness:  3/7 Lights out time:  Delayed 15 minutes due to smoking break taken because she was so sleepy.  Fell asleep between naps  Comments:  Post Nap Questions for Nap #4:  Did you sleep?  no  Did you dream? no  =============================================================== Nap #5 Pre nap level of sleepiness: 7/7 - patient is struggling   Lights out time: 4:25 PM  Comments:  Fell asleep between naps, started nap 5 minutes early because of sleepiness.  Patient's nap time was delayed due to smoking prior to nap #4, needed 30 minutes after cigarette for lights off.  Post Nap Questions for Nap #5:  Did you sleep?  Yes   Did you dream? Yes    ===============================================================

## 2013-10-22 ENCOUNTER — Telehealth: Payer: Self-pay | Admitting: Neurology

## 2013-10-22 NOTE — Telephone Encounter (Signed)
Please call patient and advise her that her sleep study and nap study did show significant degree of sleepiness but not enough to clearly fulfil criteria for narcolepsy. 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 I would like to talk to her about her test results in detail during a followup appointment. Please arrange appointment.

## 2013-10-25 NOTE — Telephone Encounter (Signed)
Called the patient and explained results from sleep study.  Pt understood borderline dx of narcolepsy and a follow up appointment has been scheduled with Dr. Frances FurbishAthar to discuss in detail.  A copy of this report was forwarded to Dr. Joycelyn SchmidVikram Penumalli and will be mailed to the pt's home address.

## 2013-11-12 ENCOUNTER — Ambulatory Visit: Payer: Medicaid Other | Admitting: Diagnostic Neuroimaging

## 2013-11-19 ENCOUNTER — Encounter: Payer: Self-pay | Admitting: Neurology

## 2013-11-19 ENCOUNTER — Ambulatory Visit (INDEPENDENT_AMBULATORY_CARE_PROVIDER_SITE_OTHER): Payer: Medicaid Other | Admitting: Neurology

## 2013-11-19 VITALS — BP 107/78 | HR 69 | Temp 98.0°F | Ht 63.0 in | Wt 111.0 lb

## 2013-11-19 DIAGNOSIS — G47419 Narcolepsy without cataplexy: Secondary | ICD-10-CM

## 2013-11-19 MED ORDER — ARMODAFINIL 150 MG PO TABS: 150.0000 mg | ORAL_TABLET | Freq: Every day | ORAL | Status: DC

## 2013-11-19 NOTE — Patient Instructions (Addendum)
You most likely have a condition called narcolepsy: This means, that you may have a sleep disorder that manifests with at times severe excessive sleepiness during the day and often with problems with sleep at night. We may have to try different medications that may help you stay awake during the day. Not everything works with everybody the same way. Wake promoting agents include stimulants and non-stimulant type medications. The most common side effects with stimulants are weight loss, insomnia, nervousness, headaches, palpitations, rise in blood pressure, anxiety. Stimulants can be addictive and subject to abuse. Non-stimulant type wake promoting medications include Provigil and Nuvigil, most common side effects include headaches, nervousness, insomnia, hypertension. In addition there is a medication called Xyrem which has been proven to be very effective in patients with narcolepsy with or without cataplexy. Some patients with narcolepsy report episodes of weakness, such as jaw or facial weakness, legs giving out, feeling wobbly or like "Jell-o", etc. in situations of anxiety, stress, laughter, sudden sadness, surprise, etc., which is called cataplexy.   We will start with Nuvigil 150 mg once daily. Use the 30 day copay card for now.   Call in 1 month for prescription refill.   Follow up in 2 months with Larita FifeLynn, NP and 4 months with me.

## 2013-11-19 NOTE — Progress Notes (Signed)
Subjective:    Patient ID: Deborah Park is a 28 y.o. female.  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 She is accompanied by her mother today. I prescribed her on 09/17/2013 at the request of Dr. Marjory LiesPenumalli for her history of daytime somnolence and episodes of prolonged sleep with inability to wake up easily. She reported sleeping up to 12 and even 24-hour at a time. I suggested she return for further sleep testing. She had a baseline sleep study followed by a nap study in July. I went over her test results with her in detail today. Her baseline sleep study from 10/07/2013 showed a sleep efficiency of 75.6% with a prolonged latency to sleep of 94.5 minutes and wake after sleep onset of 32.5 minutes with mild to moderate sleep fragmentation noted. She had an increased percentage of slow-wave sleep at 38.1% and a mildly decreased percentage of REM sleep at 16.6% with a mildly prolonged REM latency of 141.5 minutes. She had severe periodic leg movements at 52.1 per hour resulting in minimal arousals. She had mild intermittent snoring and no significant sleep disorder breathing with an AHI of 1.1 per hour. Oxyhemoglobin desaturation nadir was 89%, baseline was 94%. We proceeded with a nap study the next day. She had 5 naps and fell asleep in all 5 naps with a mean sleep latency of 8.5 minutes and 4 REM onset naps. I felt that there was concern for narcolepsy even though her polysomnogram an MS LT results were somewhat difficult or challenging to interpret. The patient was notably anxious and had a prolonged sleep latency during the nighttime sleep study and her sleep efficiency was certainly reduced. She had a mild delay in REM onset and achieved only 3 shorter REM cycles during the later part of the night. However  the fact that she achieved sleep in all 5 naps and had REM sleep and 4/5 naps is quite abnormal.  Today, she reports no change in her sleepiness. Her mom adds that she has been sleepy since the 11th grade. She has lost some jobs because she fell asleep at work. Unfortunately, she lost her Medicaid insurance recently but is hoping to get it back. The patient admits that she was quite anxious at the time of her sleep studies. She had come off of her medications gradually but feels that because she was off of her anxiety medication she had a flareup in her anxiety. She also had flare up and her insomnia because of being off of her medications but has restarted everything. She sees a psychiatrist.  She has had some trouble going to sleep and has tried Ambien in November of last year without benefit. She has been on trazodone and Lexapro which have helped her sleep initiation problems. Her sleep issues have been ongoing for 5-6 years, perhaps since she had her son, who is now 516. She works as a Garment/textile technologistmedia specialist. She has been late to work often, but does not fall asleep at work. She has had a hard time getting up in the morning to help get her son ready and he has been late for school and even absent. She has tried many alarms and has had family and friends call to help her wake up, but nothing has been successful. She has vivid dreams and  has frequent recollection of her dreams; she does dream a lot, but not necessarily in her naps. She has been told that she talks often in her sleep, especially in her naps. Her sleepiness pattern is difficult to describe for her. She can sleep for more than 12 hours 4 days a week, but sometimes up to 24 hours. She has woken up very hungry and thirsty after sleeping for prolonged period of time. She has also been known to walk in her sleep. She has held full conversations during the day and sometimes has no recollection of those. Thankfully her work environment is quite forgiving. She  has been late to work many a time. She has not fallen asleep driving. She is in obvious distress about her sleep disturbance but has not pursued help before because she did not know how to go about it and she also attributed some of her symptoms 2 underlying depression. She feels she's not very difference however. She has been on low dose Lexapro. She has never been suicidal. She can come off of Lexapro she feels. She has only started the trazodone after she noted that she could not fall asleep after she started the Lexapro. She is taking 25 mg of trazodone. Vistaril has been very helpful for her anxiety. She smokes and is not currently actively trying to quit smoking. She does not drink any alcohol. She had a miscarriage a couple of months ago. She is currently not pregnant and is not planning to get pregnant.  She had a brain MRI without contrast on 08/28/2013 which I reviewed. It did not show any abnormality other than borderline low lying cerebellar tonsils at 3-4 mm not enough to be called Chiari malformation. Otherwise normal scan.  She does not drink caffeine on a daily basis. She has a TV in her bedroom but does not rely on watching at night. She grew up in a bedroom that had TV on all night. She reports that this was a hard habit to break. She denies any restless leg symptoms and is not known to kick in her sleep. She denies sleep paralysis, cataplexy, hypnagogic or hypnopompic hallucinations. There is no family history of sleepiness disorder such as narcolepsy or idiopathic hypersomnolence or significant sleep apnea. There is some family history of insomnia.   Her Past Medical History Is Significant For: Past Medical History  Diagnosis Date  . Anxiety   . Memory loss 09/17/2013  . Sleep disturbance 09/17/2013    Her Past Surgical History Is Significant For: No past surgical history on file.  Her Family History Is Significant For: Family History  Problem Relation Age of Onset  . Healthy  Mother   . Healthy Father   . Bipolar disorder Maternal Grandmother   . Seizures Neg Hx   . Parkinsonism Neg Hx   . Multiple sclerosis Neg Hx   . Dementia Neg Hx     Her Social History Is Significant For: History   Social History  . Marital Status: Single    Spouse Name: N/A    Number of Children: 1  . Years of Education: College   Occupational History  .      not employed   Social History Main Topics  . Smoking status: Current Every Day Smoker -- 1.00 packs/day for 15 years    Types: Cigarettes  . Smokeless tobacco: Never Used  . Alcohol Use: No  . Drug Use: No  . Sexual Activity: Yes    Birth Control/ Protection: None  Other Topics Concern  . None   Social History Narrative   Patient lives at home with son.   Caffeine use; 1-3 cups daily,is right handed    Her Allergies Are:  No Known Allergies:   Her Current Medications Are:  Outpatient Encounter Prescriptions as of 11/19/2013  Medication Sig  . Calcium Carbonate-Vitamin D 600-400 MG-UNIT per tablet Take 1 tablet by mouth.  . escitalopram (LEXAPRO) 10 MG tablet Take 10 mg by mouth daily.  . hydrOXYzine (VISTARIL) 25 MG capsule Take 25 mg by mouth every 6 (six) hours as needed.  . norelgestromin-ethinyl estradiol (ORTHO EVRA) 150-35 MCG/24HR transdermal patch Apply one patch weekly x 3 weeks, off one week  . traZODone (DESYREL) 50 MG tablet Take 25 mg by mouth at bedtime.  :  Review of Systems:  Out of a complete 14 point review of systems, all are reviewed and negative with the exception of these symptoms as listed below:   Review of Systems  HENT: Positive for ear pain and hearing loss.        Hearing loss  Neurological:       Insomnia,daytime sleepiness    Objective:  Neurologic Exam  Physical Exam Physical Examination:   Filed Vitals:   11/19/13 1132  BP: 107/78  Pulse: 69  Temp: 98 F (36.7 C)    General Examination: The patient is a very pleasant 28 y.o. female in no acute distress.  She appears well-developed and well-nourished and very well groomed. She is mildly anxious appearing and very slender.  HEENT: Normocephalic, atraumatic, pupils are equal, round and reactive to light and accommodation. Funduscopic exam is normal with sharp disc margins noted. Extraocular tracking is good without limitation to gaze excursion or nystagmus noted. Normal smooth pursuit is noted. Hearing is grossly intact. Face is symmetric with normal facial animation and normal facial sensation. Speech is clear with no dysarthria noted. There is no hypophonia. There is no lip, neck/head, jaw or voice tremor. Neck is supple with full range of passive and active motion. There are no carotid bruits on auscultation. Oropharynx exam reveals: mild mouth dryness, good dental hygiene and no significant airway crowding. Mallampati is class I. Tongue protrudes centrally and palate elevates symmetrically. Tonsils are small. Neck size is slender.   Chest: Clear to auscultation without wheezing, rhonchi or crackles noted.  Heart: S1+S2+0, regular and normal without murmurs, rubs or gallops noted.   Abdomen: Soft, non-tender and non-distended with normal bowel sounds appreciated on auscultation.  Extremities: There is no pitting edema in the distal lower extremities bilaterally. Pedal pulses are intact.  Skin: Warm and dry without trophic changes noted. There are no varicose veins.  Musculoskeletal: exam reveals no obvious joint deformities, tenderness or joint swelling or erythema.   Neurologically:  Mental status: The patient is awake, alert and oriented in all 4 spheres. Her immediate and remote memory, attention, language skills and fund of knowledge are appropriate. There is no evidence of aphasia, agnosia, apraxia or anomia. Speech is clear with normal prosody and enunciation. Thought process is linear. Mood is normal and affect is normal.  Cranial nerves II - XII are as described above under HEENT exam. In  addition: shoulder shrug is normal with equal shoulder height noted. Motor exam: Normal bulk, strength and tone is noted. There is no drift, tremor or rebound. Romberg is negative. Reflexes are 2+ throughout. Babinski: Toes are flexor bilaterally. Fine motor skills and coordination are intact.  Cerebellar testing: No dysmetria or intention tremor  on finger to nose testing. There is no truncal or gait ataxia.  Sensory exam: intact to light touch.  Gait, station and balance: She stands easily. No veering to one side is noted. No leaning to one side is noted. Posture is age-appropriate and stance is narrow based. Gait shows normal stride length and normal pace. No problems turning are noted. She turns en bloc. Tandem walk is unremarkable. Intact toe and heel stance is noted.               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 She recently had a set of sleep studies and I went over her test results with her and her mother in detail today. 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 While she did not have a very quick mean sleep latency with a mean sleep latency of 8.5, she achieved REM sleep in 4 naps and fell asleep in all 5 naps. She had a mild reduction in REM sleep with a mild prolongation of REM latency during the nighttime sleep study but also had difficulty achieving and maintaining sleep in the context of being off of her antidepressant antianxiety medication. 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 She is advised to continue followup with her primary care physician as well as her psychiatrist and she will see Dr. Marjory Lies routinely  for followup as well. From a sleep standpoint, for symptomatic treatment I suggested individual. I talked to her and her mother at length about possible treatment options symptomatically including stimulants and non-stimulant wake promoting medications and I also provided her with information on Xyrem for potential consideration down the Road. Unfortunately she currently does not have insurance but should be able to use a co-pay card for a free one-month trial with Nuvigil. I talked her about potential side effects with this medication and we will start at 150 mg strength once daily. My biggest concern with using a stimulant medication would be weight loss with her. I told her that Nuvigil may potentially interact with her birth control patch. She is advised to consider using a second agent for birth control such as barrier protection. I suggested a two-month checkup with Heide Guile, nurse practitioner and I will see her back in about 4 months. I answered all her questions and she and her mother were in agreement.

## 2013-12-18 ENCOUNTER — Ambulatory Visit: Payer: Medicaid Other | Admitting: Diagnostic Neuroimaging

## 2013-12-26 ENCOUNTER — Other Ambulatory Visit: Payer: Self-pay | Admitting: Neurology

## 2013-12-30 ENCOUNTER — Other Ambulatory Visit: Payer: Self-pay

## 2013-12-30 DIAGNOSIS — G47419 Narcolepsy without cataplexy: Secondary | ICD-10-CM

## 2013-12-30 MED ORDER — ARMODAFINIL 150 MG PO TABS: 150.0000 mg | ORAL_TABLET | Freq: Every day | ORAL | Status: DC

## 2013-12-30 NOTE — Telephone Encounter (Signed)
Rx signed and faxed.

## 2013-12-31 ENCOUNTER — Ambulatory Visit: Payer: Medicaid Other | Admitting: Diagnostic Neuroimaging

## 2014-01-13 ENCOUNTER — Telehealth: Payer: Self-pay

## 2014-01-13 MED ORDER — MODAFINIL 100 MG PO TABS: 100.0000 mg | ORAL_TABLET | Freq: Two times a day (BID) | ORAL | Status: DC

## 2014-01-13 NOTE — Telephone Encounter (Signed)
Rx has been updated.  I called the patient, got no answer.  Left message.

## 2014-01-13 NOTE — Telephone Encounter (Signed)
Please change Nuvigil to Provigil 100 mg strength one pill twice daily, first dose in the morning and second dose no later than 3 PM to avoid insomnia. #60 with 5 refills. pls also call pt to advise. Thanks!

## 2014-01-13 NOTE — Telephone Encounter (Signed)
Patient now has ins coverage through Medicaid.  They will not cover Nuvigil, however, they will cover Provigil.  Okay to change Rx to formulary alternative?  Please advise.  Thank you.

## 2014-01-16 ENCOUNTER — Other Ambulatory Visit: Payer: Self-pay

## 2014-01-19 ENCOUNTER — Encounter: Payer: Self-pay | Admitting: Nurse Practitioner

## 2014-01-19 ENCOUNTER — Ambulatory Visit: Payer: Self-pay | Admitting: Nurse Practitioner

## 2014-01-19 ENCOUNTER — Ambulatory Visit (INDEPENDENT_AMBULATORY_CARE_PROVIDER_SITE_OTHER): Payer: Medicaid Other | Admitting: Nurse Practitioner

## 2014-01-19 VITALS — BP 97/60 | HR 82 | Temp 97.9°F | Ht 62.0 in | Wt 115.0 lb

## 2014-01-19 DIAGNOSIS — G47419 Narcolepsy without cataplexy: Secondary | ICD-10-CM

## 2014-01-19 NOTE — Patient Instructions (Addendum)
We recommend that you try the Provigil, start with 50 mg (1/2 tablet) in the morning and at lunch.  You may increase the dose to a whole tablet once or twice daily as tolerated.    Please use a backup method of birth control.  Follow up with Dr. Frances FurbishAthar at next scheduled visit. On 05/04/13.

## 2014-01-19 NOTE — Progress Notes (Addendum)
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   Since last visit, she tried Nuvigil for 1 month with only fair results. She reports that she felt more awake, but had more trouble getting to sleep at night. She had an increase in headaches with it as well. Her Medicaid would not pay for Nuvigil and recently the prior authorization went through for Provigil but she has not picked it up yet. She is frustrated at not having much change when she took the medication. She does not want to have an accidental pregnancy and would like an option that does not interact with her birth control patch.   Prior visit,  November 19, 2013 with Dr. Frances Furbish: She is accompanied by her mother today. I prescribed her on 09/17/2013 at the request of Dr. Marjory Lies for her history of daytime somnolence and episodes of prolonged sleep with inability to wake up easily. She reported sleeping up to 12 and even 24-hour at a time. I suggested she return for further sleep testing. She had a baseline sleep study followed by a nap study in July. I went over her test results with her in detail today. Her baseline sleep study from 10/07/2013 showed a sleep efficiency of 75.6% with a prolonged latency to sleep of 94.5 minutes and wake after sleep onset of 32.5 minutes with mild to moderate sleep fragmentation noted. She had an increased percentage of slow-wave sleep at 38.1% and a mildly decreased percentage of REM sleep at 16.6% with a mildly prolonged REM latency of 141.5 minutes. She had severe periodic leg movements at 52.1 per hour resulting in minimal arousals. She had mild intermittent snoring and no significant sleep disorder  breathing with an AHI of 1.1 per hour. Oxyhemoglobin desaturation nadir was 89%, baseline was 94%. We proceeded with a nap study the next day. She had 5 naps and fell asleep in all 5 naps with a mean sleep latency of 8.5 minutes and 4 REM onset naps. I felt that there was concern for narcolepsy even though her polysomnogram an MS LT results were somewhat difficult or challenging to interpret. The patient was notably anxious and had a prolonged sleep latency during the nighttime sleep study and her sleep efficiency was certainly reduced. She had a mild delay in REM onset and achieved only 3 shorter REM cycles during the later part of the night. However the fact that she achieved sleep in all 5 naps and had REM sleep and 4/5 naps is quite abnormal.  Today, she reports no change in her sleepiness. Her mom adds that she has been sleepy since the 11th grade. She has lost some jobs because she fell asleep at work. Unfortunately, she lost her Medicaid insurance recently but is hoping to get it back. The patient admits that she was quite anxious at the time of her sleep studies. She had come off of her medications gradually but feels that because she was off of her anxiety medication she had a flareup in her anxiety. She also had flare up and her insomnia because of being off of her medications but has restarted everything. She sees a psychiatrist.  She has had some trouble going to sleep and has tried Ambien in  November of last year without benefit. She has been on trazodone and Lexapro which have helped her sleep initiation problems. Her sleep issues have been ongoing for 5-6 years, perhaps since she had her son, who is now 226. She works as a Garment/textile technologistmedia specialist. She has been late to work often, but does not fall asleep at work. She has had a hard time getting up in the morning to help get her son ready and he has been late for school and even absent. She has tried many alarms and has had family and friends call to help her  wake up, but nothing has been successful. She has vivid dreams and has frequent recollection of her dreams; she does dream a lot, but not necessarily in her naps. She has been told that she talks often in her sleep, especially in her naps. Her sleepiness pattern is difficult to describe for her. She can sleep for more than 12 hours 4 days a week, but sometimes up to 24 hours. She has woken up very hungry and thirsty after sleeping for prolonged period of time. She has also been known to walk in her sleep. She has held full conversations during the day and sometimes has no recollection of those. Thankfully her work environment is quite forgiving. She has been late to work many a time. She has not fallen asleep driving. She is in obvious distress about her sleep disturbance but has not pursued help before because she did not know how to go about it and she also attributed some of her symptoms 2 underlying depression. She feels she's not very difference however. She has been on low dose Lexapro. She has never been suicidal. She can come off of Lexapro she feels. She has only started the trazodone after she noted that she could not fall asleep after she started the Lexapro. She is taking 25 mg of trazodone. Vistaril has been very helpful for her anxiety. She smokes and is not currently actively trying to quit smoking. She does not drink any alcohol. She had a miscarriage a couple of months ago. She is currently not pregnant and is not planning to get pregnant.  She had a brain MRI without contrast on 08/28/2013 which I reviewed. It did not show any abnormality other than borderline low lying cerebellar tonsils at 3-4 mm not enough to be called Chiari malformation. Otherwise normal scan.  She does not drink caffeine on a daily basis. She has a TV in her bedroom but does not rely on watching at night. She grew up in a bedroom that had TV on all night. She reports that this was a hard habit to break. She denies any  restless leg symptoms and is not known to kick in her sleep. She denies sleep paralysis, cataplexy, hypnagogic or hypnopompic hallucinations. There is no family history of sleepiness disorder such as narcolepsy or idiopathic hypersomnolence or significant sleep apnea. There is some family history of insomnia.   REVIEW OF SYSTEMS: Full 14 system review of systems performed and notable only for: Insomnia, frequent waking   ALLERGIES: No Known Allergies  HOME MEDICATIONS: Outpatient Prescriptions Prior to Visit  Medication Sig Dispense Refill  . Calcium Carbonate-Vitamin D 600-400 MG-UNIT per tablet Take 1 tablet by mouth.      . escitalopram (LEXAPRO) 10 MG tablet Take 10 mg by mouth daily.      . hydrOXYzine (VISTARIL) 25 MG capsule Take 25 mg by mouth every 6 (six) hours as needed.      .Marland Kitchen  modafinil (PROVIGIL) 100 MG tablet Take 1 tablet (100 mg total) by mouth 2 (two) times daily. First dose in AM and second dose no later than 3PM  60 tablet  5  . norelgestromin-ethinyl estradiol (ORTHO EVRA) 150-35 MCG/24HR transdermal patch Apply one patch weekly x 3 weeks, off one week      . traZODone (DESYREL) 50 MG tablet Take 25 mg by mouth at bedtime.       No facility-administered medications prior to visit.    PHYSICAL EXAM Filed Vitals:   01/19/14 1522  BP: 97/60  Pulse: 82  Temp: 97.9 F (36.6 C)  TempSrc: Oral  Height: 5\' 2"  (1.575 m)  Weight: 115 lb (52.164 kg)   Body mass index is 21.03 kg/(m^2).  General Examination: The patient is a very pleasant 28 y.o. female in no acute distress. She appears well-developed and well-nourished and very well groomed. She is mildly anxious appearing and very slender.  HEENT: Normocephalic, atraumatic, pupils are equal, round and reactive to light and accommodation. Funduscopic exam is normal with sharp disc margins noted. Extraocular tracking is good without limitation to gaze excursion or nystagmus noted. Normal smooth pursuit is noted. Hearing is  grossly intact. Face is symmetric with normal facial animation and normal facial sensation. Speech is clear with no dysarthria noted. There is no hypophonia. There is no lip, neck/head, jaw or voice tremor. Neck is supple with full range of passive and active motion. There are no carotid bruits on auscultation. Oropharynx exam reveals: mild mouth dryness, good dental hygiene and no significant airway crowding. Mallampati is class I. Tongue protrudes centrally and palate elevates symmetrically. Tonsils are small. Neck size is slender.  Chest: Clear to auscultation without wheezing, rhonchi or crackles noted.  Heart: S1+S2+0, regular and normal without murmurs, rubs or gallops noted.  Abdomen: Soft, non-tender and non-distended with normal bowel sounds appreciated on auscultation.  Extremities: There is no pitting edema in the distal lower extremities bilaterally. Pedal pulses are intact.  Skin: Warm and dry without trophic changes noted. There are no varicose veins.  Musculoskeletal: exam reveals no obvious joint deformities, tenderness or joint swelling or erythema.  Neurologically:  Mental status: The patient is awake, alert and oriented in all 4 spheres. Her immediate and remote memory, attention, language skills and fund of knowledge are appropriate. There is no evidence of aphasia, agnosia, apraxia or anomia. Speech is clear with normal prosody and enunciation. Thought process is linear. Mood is normal and affect is normal.  Cranial nerves II - XII are as described above under HEENT exam. In addition: shoulder shrug is normal with equal shoulder height noted.  Motor exam: Normal bulk, strength and tone is noted. There is no drift, tremor or rebound. Romberg is negative. Reflexes are 2+ throughout. Babinski: Toes are flexor bilaterally. Fine motor skills and coordination are intact.  Cerebellar testing: No dysmetria or intention tremor on finger to nose testing. There is no truncal or gait ataxia.    Sensory exam: intact to light touch.  Gait, station and balance: She stands easily. No veering to one side is noted. No leaning to one side is noted. Posture is age-appropriate and stance is narrow based. Gait shows normal stride length and normal pace. No problems turning are noted. She turns en bloc. Tandem walk is unremarkable. Intact toe and heel stance is noted.    ASSESSMENT: In summary, Lelon MastSamantha A Sherrye PayorLawing is a very pleasant [age] year old female with a medical history of anxiety, memory loss  in the context of head trauma, and depression, who has had a long-standing history of excessive daytime somnolence and difficulty with awakening. She recently had a set of sleep studies and I went over her test results with her and her mother in detail today. 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 While she did not have a very quick mean sleep latency with a mean sleep latency of 8.5, she achieved REM sleep in 4 naps and fell asleep in all 5 naps. She had a mild reduction in REM sleep with a mild prolongation of REM latency during the nighttime sleep study but also had difficulty achieving and maintaining sleep in the context of being off of her antidepressant antianxiety medication. 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   PLAN: From a sleep standpoint, for symptomatic narcolepsy Dr. Frances Furbish suggested Nuvigil. She tried this for a month but had more difficulty sleeping at night and had headaches.  Nevertheless, her insurance would not pay for Nuvigil but did approve Provigil. We talked again about Xyrem, but this is not a good option for her as she is a single parent and must be available for her son if need be at night. We talked some about stimulants like Adderall or Ritalin, but she is fearful of taking those as well.  She has not yet picked up her Rx for Provigil. I would like  her to give it a trial. I recommended that she start 50 mg in the morning and work up to 50 mg in the morning and at lunch.  If this is not enough, she may titrate up to the 100 mg dose in the am and noon. Provigil may potentially interact with her birth control patch. She is advised to consider using a second agent for birth control such as barrier protection.  She is very fearful of an accidental pregnancy and does not like the idea of being on a medication that may cause birth defects.   Dr Frances Furbish will see her back in about 4 months.  This appointment is already scheduled. She may call for sooner appointment if need be.  I answered all her questions and she was in agreement.  Tawny Asal Jomes Giraldo, MSN, FNP-BC, A/GNP-C 01/19/2014, 3:55 PM [facility] [address], [address] [place], [place] [address] [phone]  Note: This document was prepared with digital dictation and possible smart phrase technology. Any transcriptional errors that result from this process are unintentional.  I reviewed the above note and documentation by the Nurse Practitioner and agree with the history, physical exam, assessment and plan as outlined above. I was immediately available for face-to-face consultation. Huston Foley, MD, PhD [facility])

## 2014-01-27 ENCOUNTER — Ambulatory Visit: Payer: Medicaid Other | Admitting: Diagnostic Neuroimaging

## 2014-02-02 ENCOUNTER — Encounter: Payer: Self-pay | Admitting: Nurse Practitioner

## 2014-03-23 ENCOUNTER — Encounter: Payer: Self-pay | Admitting: Neurology

## 2014-03-23 NOTE — Telephone Encounter (Signed)
Sleep walking is not mentioned anywhere in the PI from manufacturer.  I called the patient.  Got no answer.  Left message.

## 2014-04-02 ENCOUNTER — Telehealth: Payer: Self-pay | Admitting: Neurology

## 2014-04-02 ENCOUNTER — Encounter: Payer: Self-pay | Admitting: *Deleted

## 2014-04-02 NOTE — Telephone Encounter (Signed)
Called patient to inform of template change thru VM message , appointment has been changed to 05/04/14 at 1:30pm,letter mailed.

## 2014-04-17 ENCOUNTER — Encounter: Payer: Self-pay | Admitting: *Deleted

## 2014-05-04 ENCOUNTER — Telehealth: Payer: Self-pay | Admitting: Neurology

## 2014-05-04 ENCOUNTER — Ambulatory Visit: Payer: Self-pay | Admitting: Neurology

## 2014-05-04 NOTE — Telephone Encounter (Signed)
Patient is a no show for today's appointment at 1:30(05/04/14)

## 2014-05-07 ENCOUNTER — Encounter: Payer: Self-pay | Admitting: Neurology

## 2014-05-25 ENCOUNTER — Encounter: Payer: Medicaid Other | Admitting: Obstetrics & Gynecology

## 2015-11-07 ENCOUNTER — Emergency Department (HOSPITAL_COMMUNITY)
Admission: EM | Admit: 2015-11-07 | Discharge: 2015-11-07 | Disposition: A | Discharge status: Home / Self Care | Payer: Medicaid Other | Attending: Physician Assistant | Admitting: Physician Assistant

## 2015-11-07 ENCOUNTER — Encounter (HOSPITAL_COMMUNITY): Payer: Self-pay | Admitting: Emergency Medicine

## 2015-11-07 ENCOUNTER — Emergency Department (HOSPITAL_COMMUNITY): Payer: Medicaid Other

## 2015-11-07 DIAGNOSIS — O4690 Antepartum hemorrhage, unspecified, unspecified trimester: Secondary | ICD-10-CM

## 2015-11-07 DIAGNOSIS — O9A211 Injury, poisoning and certain other consequences of external causes complicating pregnancy, first trimester: Secondary | ICD-10-CM | POA: Diagnosis present

## 2015-11-07 DIAGNOSIS — Y999 Unspecified external cause status: Secondary | ICD-10-CM | POA: Insufficient documentation

## 2015-11-07 DIAGNOSIS — O99331 Smoking (tobacco) complicating pregnancy, first trimester: Secondary | ICD-10-CM | POA: Insufficient documentation

## 2015-11-07 DIAGNOSIS — S0990XA Unspecified injury of head, initial encounter: Secondary | ICD-10-CM

## 2015-11-07 DIAGNOSIS — O209 Hemorrhage in early pregnancy, unspecified: Secondary | ICD-10-CM | POA: Insufficient documentation

## 2015-11-07 DIAGNOSIS — F17210 Nicotine dependence, cigarettes, uncomplicated: Secondary | ICD-10-CM | POA: Diagnosis not present

## 2015-11-07 DIAGNOSIS — S5001XA Contusion of right elbow, initial encounter: Secondary | ICD-10-CM | POA: Diagnosis not present

## 2015-11-07 DIAGNOSIS — Z3A01 Less than 8 weeks gestation of pregnancy: Secondary | ICD-10-CM | POA: Diagnosis not present

## 2015-11-07 DIAGNOSIS — Z79899 Other long term (current) drug therapy: Secondary | ICD-10-CM | POA: Diagnosis not present

## 2015-11-07 DIAGNOSIS — Y92009 Unspecified place in unspecified non-institutional (private) residence as the place of occurrence of the external cause: Secondary | ICD-10-CM | POA: Diagnosis not present

## 2015-11-07 DIAGNOSIS — Y939 Activity, unspecified: Secondary | ICD-10-CM | POA: Diagnosis not present

## 2015-11-07 DIAGNOSIS — S0101XA Laceration without foreign body of scalp, initial encounter: Secondary | ICD-10-CM | POA: Insufficient documentation

## 2015-11-07 DIAGNOSIS — M7918 Myalgia, other site: Secondary | ICD-10-CM

## 2015-11-07 DIAGNOSIS — S0191XA Laceration without foreign body of unspecified part of head, initial encounter: Secondary | ICD-10-CM

## 2015-11-07 LAB — I-STAT CHEM 8, ED
BUN: 15 mg/dL (ref 6–20)
CHLORIDE: 100 mmol/L — AB (ref 101–111)
CREATININE: 0.5 mg/dL (ref 0.44–1.00)
Calcium, Ion: 1.13 mmol/L (ref 1.13–1.30)
GLUCOSE: 88 mg/dL (ref 65–99)
HEMATOCRIT: 43 % (ref 36.0–46.0)
HEMOGLOBIN: 14.6 g/dL (ref 12.0–15.0)
POTASSIUM: 4.6 mmol/L (ref 3.5–5.1)
Sodium: 136 mmol/L (ref 135–145)
TCO2: 28 mmol/L (ref 0–100)

## 2015-11-07 LAB — I-STAT BETA HCG BLOOD, ED (MC, WL, AP ONLY)

## 2015-11-07 LAB — HCG, QUANTITATIVE, PREGNANCY: HCG, BETA CHAIN, QUANT, S: 79827 m[IU]/mL — AB (ref <5)

## 2015-11-07 LAB — ABO/RH: ABO/RH(D): O POS

## 2015-11-07 MED ORDER — CYCLOBENZAPRINE HCL 10 MG PO TABS: 10.0000 mg | ORAL_TABLET | Freq: Three times a day (TID) | ORAL | 0 refills | Status: DC | PRN

## 2015-11-07 MED ORDER — IOPAMIDOL (ISOVUE-300) INJECTION 61%
80.0000 mL | Freq: Once | INTRAVENOUS | Status: AC | PRN
  Administered 2015-11-07: 80 mL via INTRAVENOUS

## 2015-11-07 MED ORDER — DIAZEPAM 5 MG/ML IJ SOLN
5.0000 mg | Freq: Once | INTRAMUSCULAR | Status: AC
  Administered 2015-11-07: 5 mg via INTRAVENOUS
  Filled 2015-11-07: qty 2

## 2015-11-07 MED ORDER — MORPHINE SULFATE (PF) 4 MG/ML IV SOLN
4.0000 mg | Freq: Once | INTRAVENOUS | Status: DC
Start: 2015-11-07 — End: 2015-11-07

## 2015-11-07 MED ORDER — LIDOCAINE HCL (PF) 1 % IJ SOLN
INTRAMUSCULAR | Status: AC
  Administered 2015-11-07: 15:00:00
  Filled 2015-11-07: qty 30

## 2015-11-07 MED ORDER — MORPHINE SULFATE (PF) 2 MG/ML IV SOLN
2.0000 mg | Freq: Once | INTRAVENOUS | Status: DC
Start: 2015-11-07 — End: 2015-11-07
  Filled 2015-11-07: qty 1

## 2015-11-07 MED ORDER — HYDROCODONE-ACETAMINOPHEN 5-325 MG PO TABS: 1.0000 | ORAL_TABLET | ORAL | 0 refills | Status: DC | PRN

## 2015-11-07 MED ORDER — LIDOCAINE HCL (PF) 1 % IJ SOLN
5.0000 mL | Freq: Once | INTRAMUSCULAR | Status: AC
  Administered 2015-11-07: 5 mL
  Filled 2015-11-07: qty 5

## 2015-11-07 MED ORDER — SODIUM CHLORIDE 0.9 % IV BOLUS (SEPSIS)
500.0000 mL | Freq: Once | INTRAVENOUS | Status: AC
  Administered 2015-11-07: 500 mL via INTRAVENOUS
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
## 2015-11-07 NOTE — ED Notes (Signed)
Bed: WA28 Expected date:  Expected time:  Means of arrival:  Comments: 

## 2015-11-07 NOTE — Discharge Instructions (Signed)
Read the information below.  Use the prescribed medication as directed.  Please discuss all new medications with your pharmacist.  Do not take additional tylenol while taking the prescribed pain medication to avoid overdose.  You may return to the Emergency Department at any time for worsening condition or any new symptoms that concern you.    If you develop redness, swelling, pus draining from the wound, or fevers greater than 100.4, return to the ER immediately for a recheck.   °

## 2015-11-07 NOTE — ED Notes (Signed)
Bed: WA29 Expected date:  Expected time:  Means of arrival:  Comments: 
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
## 2017-01-05 ENCOUNTER — Telehealth: Payer: Self-pay | Admitting: Diagnostic Neuroimaging

## 2017-01-05 NOTE — Telephone Encounter (Signed)
I called patient to duscuss upcoming appt. No answer. -VRP

## 2017-01-07 DIAGNOSIS — Z79899 Other long term (current) drug therapy: Secondary | ICD-10-CM | POA: Diagnosis not present

## 2017-01-07 DIAGNOSIS — X58XXXA Exposure to other specified factors, initial encounter: Secondary | ICD-10-CM | POA: Diagnosis not present

## 2017-01-07 DIAGNOSIS — S0083XA Contusion of other part of head, initial encounter: Secondary | ICD-10-CM | POA: Diagnosis not present

## 2017-01-07 DIAGNOSIS — S2020XA Contusion of thorax, unspecified, initial encounter: Secondary | ICD-10-CM | POA: Insufficient documentation

## 2017-01-07 DIAGNOSIS — F17210 Nicotine dependence, cigarettes, uncomplicated: Secondary | ICD-10-CM | POA: Insufficient documentation

## 2017-01-07 DIAGNOSIS — Y929 Unspecified place or not applicable: Secondary | ICD-10-CM | POA: Diagnosis not present

## 2017-01-07 DIAGNOSIS — Y939 Activity, unspecified: Secondary | ICD-10-CM | POA: Diagnosis not present

## 2017-01-07 DIAGNOSIS — S0990XA Unspecified injury of head, initial encounter: Secondary | ICD-10-CM | POA: Diagnosis present

## 2017-01-07 DIAGNOSIS — Y999 Unspecified external cause status: Secondary | ICD-10-CM | POA: Insufficient documentation

## 2017-01-08 ENCOUNTER — Emergency Department (HOSPITAL_COMMUNITY): Payer: Medicaid Other

## 2017-01-08 ENCOUNTER — Encounter (HOSPITAL_COMMUNITY): Payer: Self-pay | Admitting: Emergency Medicine

## 2017-01-08 ENCOUNTER — Emergency Department (HOSPITAL_COMMUNITY)
Admission: EM | Admit: 2017-01-08 | Discharge: 2017-01-08 | Disposition: A | Discharge status: Home / Self Care | Payer: Medicaid Other | Attending: Emergency Medicine | Admitting: Emergency Medicine

## 2017-01-08 DIAGNOSIS — S2020XA Contusion of thorax, unspecified, initial encounter: Secondary | ICD-10-CM

## 2017-01-08 DIAGNOSIS — S0083XA Contusion of other part of head, initial encounter: Secondary | ICD-10-CM

## 2017-01-08 LAB — RAPID URINE DRUG SCREEN, HOSP PERFORMED
AMPHETAMINES: NOT DETECTED
BARBITURATES: NOT DETECTED
BENZODIAZEPINES: NOT DETECTED
COCAINE: POSITIVE — AB
Opiates: NOT DETECTED
TETRAHYDROCANNABINOL: NOT DETECTED

## 2017-01-08 LAB — POC URINE PREG, ED: PREG TEST UR: NEGATIVE

## 2017-01-08 MED ORDER — CEFTRIAXONE SODIUM 250 MG IJ SOLR
250.0000 mg | Freq: Once | INTRAMUSCULAR | Status: DC
  Filled 2017-01-08: qty 250

## 2017-01-08 MED ORDER — ULIPRISTAL ACETATE 30 MG PO TABS
30.0000 mg | ORAL_TABLET | Freq: Once | ORAL | Status: DC
  Filled 2017-01-08: qty 1

## 2017-01-08 MED ORDER — LIDOCAINE HCL 1 % IJ SOLN
INTRAMUSCULAR | Status: AC
  Filled 2017-01-08: qty 20

## 2017-01-08 MED ORDER — TETANUS-DIPHTH-ACELL PERTUSSIS 5-2.5-18.5 LF-MCG/0.5 IM SUSP
0.5000 mL | Freq: Once | INTRAMUSCULAR | Status: DC
  Filled 2017-01-08: qty 0.5

## 2017-01-08 MED ORDER — AZITHROMYCIN 250 MG PO TABS
1000.0000 mg | ORAL_TABLET | Freq: Once | ORAL | Status: DC
  Filled 2017-01-08: qty 4

## 2017-01-08 NOTE — ED Notes (Signed)
Bed: WA21 Expected date:  Expected time:  Means of arrival:  Comments: [place]
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
## 2017-01-08 NOTE — ED Notes (Signed)
Pt left without receiving medications or discharge papers.
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
## 2017-01-16 NOTE — Telephone Encounter (Signed)
Spoke with patient and informed her that Dr Marjory Lies had tried to reach her a week and a half ago. Advised her that Dr Marjory Lies stated he does not think he has anything to offer her regarding the chiari malformation. Dr Marjory Lies stated that she does not have this based on her recent scans, imaging. Patient verbalized understanding, appreciation of call.

## 2017-02-28 ENCOUNTER — Institutional Professional Consult (permissible substitution): Payer: Medicaid Other | Admitting: Diagnostic Neuroimaging

## 2017-03-18 ENCOUNTER — Encounter (HOSPITAL_COMMUNITY): Payer: Self-pay | Admitting: Emergency Medicine

## 2017-03-18 ENCOUNTER — Other Ambulatory Visit (HOSPITAL_COMMUNITY): Payer: Self-pay | Admitting: Radiology

## 2017-03-18 ENCOUNTER — Emergency Department (HOSPITAL_COMMUNITY): Payer: Medicaid Other

## 2017-03-18 ENCOUNTER — Emergency Department (HOSPITAL_COMMUNITY)
Admission: EM | Admit: 2017-03-18 | Discharge: 2017-03-18 | Disposition: A | Discharge status: Home / Self Care | Payer: Medicaid Other | Attending: Emergency Medicine | Admitting: Emergency Medicine

## 2017-03-18 DIAGNOSIS — R109 Unspecified abdominal pain: Secondary | ICD-10-CM | POA: Insufficient documentation

## 2017-03-18 DIAGNOSIS — R110 Nausea: Secondary | ICD-10-CM | POA: Insufficient documentation

## 2017-03-18 DIAGNOSIS — F17210 Nicotine dependence, cigarettes, uncomplicated: Secondary | ICD-10-CM | POA: Insufficient documentation

## 2017-03-18 DIAGNOSIS — K625 Hemorrhage of anus and rectum: Secondary | ICD-10-CM | POA: Insufficient documentation

## 2017-03-18 DIAGNOSIS — R1032 Left lower quadrant pain: Secondary | ICD-10-CM | POA: Insufficient documentation

## 2017-03-18 LAB — I-STAT CHEM 8, ED
BUN: 18 mg/dL (ref 6–20)
CALCIUM ION: 1.18 mmol/L (ref 1.15–1.40)
Chloride: 100 mmol/L — ABNORMAL LOW (ref 101–111)
Creatinine, Ser: 0.8 mg/dL (ref 0.44–1.00)
GLUCOSE: 97 mg/dL (ref 65–99)
HCT: 51 % — ABNORMAL HIGH (ref 36.0–46.0)
HEMOGLOBIN: 17.3 g/dL — AB (ref 12.0–15.0)
POTASSIUM: 3.3 mmol/L — AB (ref 3.5–5.1)
SODIUM: 140 mmol/L (ref 135–145)
TCO2: 31 mmol/L (ref 22–32)

## 2017-03-18 LAB — URINALYSIS, ROUTINE W REFLEX MICROSCOPIC
BILIRUBIN URINE: NEGATIVE
Bacteria, UA: NONE SEEN
GLUCOSE, UA: NEGATIVE mg/dL
HGB URINE DIPSTICK: NEGATIVE
KETONES UR: NEGATIVE mg/dL
Nitrite: NEGATIVE
PH: 6 (ref 5.0–8.0)
Protein, ur: NEGATIVE mg/dL
Specific Gravity, Urine: 1.011 (ref 1.005–1.030)

## 2017-03-18 LAB — COMPREHENSIVE METABOLIC PANEL
ALBUMIN: 4.4 g/dL (ref 3.5–5.0)
ALK PHOS: 79 U/L (ref 38–126)
ALT: 13 U/L — AB (ref 14–54)
AST: 22 U/L (ref 15–41)
Anion gap: 8 (ref 5–15)
BUN: 17 mg/dL (ref 6–20)
CALCIUM: 9.6 mg/dL (ref 8.9–10.3)
CHLORIDE: 102 mmol/L (ref 101–111)
CO2: 29 mmol/L (ref 22–32)
CREATININE: 0.76 mg/dL (ref 0.44–1.00)
GFR calc non Af Amer: >60 mL/min (ref >60)
GLUCOSE: 98 mg/dL (ref 65–99)
Potassium: 3.2 mmol/L — ABNORMAL LOW (ref 3.5–5.1)
SODIUM: 139 mmol/L (ref 135–145)
Total Bilirubin: 1.1 mg/dL (ref 0.3–1.2)
Total Protein: 8.2 g/dL — ABNORMAL HIGH (ref 6.5–8.1)

## 2017-03-18 LAB — CBC WITH DIFFERENTIAL/PLATELET
BASOS ABS: 0.1 10*3/uL (ref 0.0–0.1)
Basophils Relative: 0 %
EOS ABS: 0.3 10*3/uL (ref 0.0–0.7)
EOS PCT: 2 %
HCT: 47.2 % — ABNORMAL HIGH (ref 36.0–46.0)
HEMOGLOBIN: 15.8 g/dL — AB (ref 12.0–15.0)
LYMPHS PCT: 26 %
Lymphs Abs: 3.3 10*3/uL (ref 0.7–4.0)
MCH: 33.5 pg (ref 26.0–34.0)
MCHC: 33.5 g/dL (ref 30.0–36.0)
MCV: 100 fL (ref 78.0–100.0)
Monocytes Absolute: 0.9 10*3/uL (ref 0.1–1.0)
Monocytes Relative: 7 %
NEUTROS PCT: 65 %
Neutro Abs: 8.5 10*3/uL — ABNORMAL HIGH (ref 1.7–7.7)
PLATELETS: 413 10*3/uL — AB (ref 150–400)
RBC: 4.72 MIL/uL (ref 3.87–5.11)
RDW: 12.6 % (ref 11.5–15.5)
WBC: 13 10*3/uL — AB (ref 4.0–10.5)

## 2017-03-18 LAB — TYPE AND SCREEN
ABO/RH(D): O POS
ANTIBODY SCREEN: NEGATIVE

## 2017-03-18 LAB — I-STAT BETA HCG BLOOD, ED (MC, WL, AP ONLY)

## 2017-03-18 LAB — POC OCCULT BLOOD, ED: Fecal Occult Bld: NEGATIVE

## 2017-03-18 MED ORDER — IOPAMIDOL (ISOVUE-300) INJECTION 61%
INTRAVENOUS | Status: AC
  Filled 2017-03-18: qty 100

## 2017-03-18 MED ORDER — DICYCLOMINE HCL 20 MG PO TABS: 20.0000 mg | ORAL_TABLET | Freq: Two times a day (BID) | ORAL | 0 refills | Status: DC

## 2017-03-18 MED ORDER — DICYCLOMINE HCL 10 MG PO CAPS
10.0000 mg | ORAL_CAPSULE | Freq: Once | ORAL | Status: AC
  Administered 2017-03-18: 10 mg via ORAL
  Filled 2017-03-18: qty 1

## 2017-03-18 MED ORDER — FENTANYL CITRATE (PF) 100 MCG/2ML IJ SOLN
100.0000 ug | Freq: Once | INTRAMUSCULAR | Status: AC
  Administered 2017-03-18: 100 ug via INTRAVENOUS
  Filled 2017-03-18: qty 2

## 2017-03-18 MED ORDER — IOPAMIDOL (ISOVUE-300) INJECTION 61%: 15.0000 mL | Freq: Once | INTRAVENOUS | Status: AC | PRN

## 2017-03-18 MED ORDER — SODIUM CHLORIDE 0.9 % IV BOLUS (SEPSIS)
2000.0000 mL | Freq: Once | INTRAVENOUS | Status: AC
  Administered 2017-03-18: 2000 mL via INTRAVENOUS

## 2017-03-18 MED ORDER — IOPAMIDOL (ISOVUE-300) INJECTION 61%
100.0000 mL | Freq: Once | INTRAVENOUS | Status: AC | PRN
  Administered 2017-03-18: 30 mL via ORAL
  Administered 2017-03-18: 100 mL via INTRAVENOUS

## 2017-03-18 MED ORDER — IOPAMIDOL (ISOVUE-300) INJECTION 61%
INTRAVENOUS | Status: AC
  Administered 2017-03-18: 30 mL via ORAL
  Filled 2017-03-18: qty 30

## 2017-03-18 MED ORDER — HYDROCODONE-ACETAMINOPHEN 5-325 MG PO TABS
1.0000 | ORAL_TABLET | Freq: Once | ORAL | Status: AC
  Administered 2017-03-18: 1 via ORAL
  Filled 2017-03-18: qty 1

## 2017-03-18 NOTE — ED Triage Notes (Signed)
Patient c/o abd pain with rectal bleeding. Patient reports that she hasnt had BM in couple days which is unusual for her, but having just blood coming from rectum.

## 2017-03-18 NOTE — ED Provider Notes (Signed)
[facility]-EMERGENCY DEPT Provider Note   CSN: [id number] Arrival date & time: 03/18/17  1503     History   Chief Complaint Chief Complaint  Patient presents with  . Rectal Bleeding    HPI Deborah Park is a 31 y.o. female who presents for evaluation  rectal bleeding that began today.  Patient reports that over the last few days she has had some vague lower abdominal discomfort, most notably in the left lower quadrant.  Patient reports that she has not had a bowel movement last days.  Patient states that earlier today, she felt like she was going to have a bowel movement when she went, she noted bright red blood from her rectum.  Patient reports that since then, she has had 7 episodes of bright red blood per rectum.  Patient denies any gross bleeding in between.  Patient reports that she has had some intermittent nausea but no vomiting or.  Patient has not taken any medications for the symptoms.  Patient denies any history of colitis, ulcers.  Patient denies any chronic NSAID use.  Patient does smoke approximately  pack/day.  Patient denies any fever, chest pain, shortness of breath, dysuria, hematuria, vaginal bleeding.  Her LMP was 1 week ago.  The history is provided by the patient.    Past Medical History:  Diagnosis Date  . Anxiety   . Memory loss 09/17/2013  . Sleep disturbance 09/17/2013    Patient Active Problem List   Diagnosis Date Noted  . Memory loss 09/17/2013  . Sleep disturbance 09/17/2013    History reviewed. No pertinent surgical history.  OB History    Gravida Para Term Preterm AB Living   3 1 1   1 1    SAB TAB Ectopic Multiple Live Births   1       1       Home Medications    Prior to Admission medications   Medication Sig Start Date End Date Taking? Authorizing Provider  cyclobenzaprine (FLEXERIL) 10 MG tablet Take 1 tablet (10 mg total) by mouth 3 (three) times daily as needed for muscle spasms (and pain). Patient not  taking: Reported on 03/18/2017 11/07/15   Trixie Dredge, PA-C  dicyclomine (BENTYL) 20 MG tablet Take 1 tablet (20 mg total) by mouth 2 (two) times daily. 03/18/17   Maxwell Caul, PA-C  HYDROcodone-acetaminophen (NORCO/VICODIN) 5-325 MG tablet Take 1-2 tablets by mouth every 4 (four) hours as needed for moderate pain or severe pain. Patient not taking: Reported on 03/18/2017 11/07/15   Trixie Dredge, PA-C  modafinil (PROVIGIL) 100 MG tablet Take 1 tablet (100 mg total) by mouth 2 (two) times daily. First dose in AM and second dose no later than 3PM Patient not taking: Reported on 03/18/2017 01/13/14   Huston Foley, MD    Family History Family History  Problem Relation Age of Onset  . Healthy Mother   . Healthy Father   . Bipolar disorder Maternal Grandmother   . Seizures Neg Hx   . Parkinsonism Neg Hx   . Multiple sclerosis Neg Hx   . Dementia Neg Hx     Social History Social History   Tobacco Use  . Smoking status: Current Every Day Smoker    Packs/day: 1.00    Years: 15.00    Pack years: 15.00    Types: Cigarettes  . Smokeless tobacco: Never Used  Substance Use Topics  . Alcohol use: Yes  . Drug use: No  Allergies   Patient has no known allergies.   Review of Systems Review of Systems  Constitutional: Negative for chills and fever.  HENT: Negative for congestion and trouble swallowing.   Eyes: Negative for visual disturbance.  Respiratory: Negative for cough and shortness of breath.   Cardiovascular: Negative for chest pain.  Gastrointestinal: Positive for abdominal pain and blood in stool. Negative for diarrhea, nausea and vomiting.  Genitourinary: Negative for dysuria and hematuria.  Musculoskeletal: Negative for back pain and neck pain.  Skin: Negative for rash.  Neurological: Negative for dizziness, weakness, numbness and headaches.  Psychiatric/Behavioral: Negative for confusion.     Physical Exam Updated Vital Signs BP 115/80 (BP Location: Left Arm)    Pulse 88   Temp 98 F (36.7 C) (Oral)   Resp 16   Ht 5\' 4"  (1.626 m)   Wt 45.4 kg (100 lb)   SpO2 97%   BMI 17.16 kg/m   Physical Exam  Constitutional: She is oriented to person, place, and time. She appears well-developed and well-nourished.  Appears uncomfortable but no acute distress   HENT:  Head: Normocephalic and atraumatic.  Mouth/Throat: Oropharynx is clear and moist and mucous membranes are normal.  Eyes: Conjunctivae, EOM and lids are normal. Pupils are equal, round, and reactive to light.  Neck: Full passive range of motion without pain.  Cardiovascular: Normal rate, regular rhythm, normal heart sounds and normal pulses. Exam reveals no gallop and no friction rub.  No murmur heard. Pulmonary/Chest: Effort normal and breath sounds normal.  Abdominal: Soft. Normal appearance. There is tenderness in the left upper quadrant and left lower quadrant. There is no rigidity and no guarding.  Genitourinary: Rectal exam shows external hemorrhoid. Rectal exam shows guaiac negative stool.  Genitourinary Comments: The exam was performed with a chaperone present. No evidence of anal fissure. Small nonthrombosed external hemorrhoid.  No evidence of blood.  No gross blood on DRE.  No mass, fluctuance.  Musculoskeletal: Normal range of motion.  Neurological: She is alert and oriented to person, place, and time.  Skin: Skin is warm and dry. Capillary refill takes less than 2 seconds.  Psychiatric: She has a normal mood and affect. Her speech is normal.  Nursing note and vitals reviewed.    ED Treatments / Results  Labs (all labs ordered are listed, but only abnormal results are displayed) Labs Reviewed  COMPREHENSIVE METABOLIC PANEL - Abnormal; Notable for the following components:      Result Value   Potassium 3.2 (*)    Total Protein 8.2 (*)    ALT 13 (*)    All other components within normal limits  CBC WITH DIFFERENTIAL/PLATELET - Abnormal; Notable for the following  components:   WBC 13.0 (*)    Hemoglobin 15.8 (*)    HCT 47.2 (*)    Platelets 413 (*)    Neutro Abs 8.5 (*)    All other components within normal limits  I-STAT CHEM 8, ED - Abnormal; Notable for the following components:   Potassium 3.3 (*)    Chloride 100 (*)    Hemoglobin 17.3 (*)    HCT 51.0 (*)    All other components within normal limits  URINALYSIS, ROUTINE W REFLEX MICROSCOPIC  POC OCCULT BLOOD, ED  I-STAT BETA HCG BLOOD, ED ([facility], [facility], [facility] ONLY)  TYPE AND SCREEN    EKG  EKG Interpretation None       Radiology Ct Abdomen Pelvis W Contrast  Result Date: 03/18/2017 CLINICAL DATA:  Left  lower quadrant pain and rectal bleeding. No bowel movement for a couple of days. EXAM: CT ABDOMEN AND PELVIS WITH CONTRAST TECHNIQUE: Multidetector CT imaging of the abdomen and pelvis was performed using the standard protocol following bolus administration of intravenous contrast. CONTRAST:  ISOVUE-300 IOPAMIDOL (ISOVUE-300) INJECTION 61%, 30mL ISOVUE-300 IOPAMIDOL (ISOVUE-300) INJECTION 61% COMPARISON:  None. FINDINGS: Lower chest: No acute abnormality. Hepatobiliary: No focal liver abnormality is seen. No gallstones, gallbladder wall thickening, or biliary dilatation. Pancreas: Unremarkable. No pancreatic ductal dilatation or surrounding inflammatory changes. Spleen: Normal in size without focal abnormality. Adrenals/Urinary Tract: The adrenal glands, kidneys, and ureters are normal. A small amount of air seen anteriorly in the bladder with no wall thickening or filling defect. Stomach/Bowel: The stomach is mildly distended with debris, possibly from recent medial. The small bowel is normal. There is moderate fecal loading in the colon extending from the ascending colon through the descending colon with no sigmoid colon or rectal involvement identified. The colon is otherwise normal. The appendix is unremarkable. Vascular/Lymphatic: No significant vascular findings are present. No enlarged  abdominal or pelvic lymph nodes. Reproductive: Uterus and bilateral adnexa are unremarkable. Other: No abdominal wall hernia or abnormality. No abdominopelvic ascites. Musculoskeletal: No acute or significant osseous findings. IMPRESSION: 1. Moderate fecal loading in the colon. 2. A small amount of air anteriorly in the bladder could be from a recent catheterization. Recommend clinical correlation. No bladder wall thickening or filling defect. The kidneys are normal. 3. No other abnormalities identified. Electronically Signed   By: Gerome Sam III M.D   On: 03/18/2017 19:36    Procedures Procedures (including critical care time)  Medications Ordered in ED Medications  iopamidol (ISOVUE-300) 61 % injection 15 mL (not administered)  iopamidol (ISOVUE-300) 61 % injection (not administered)  sodium chloride 0.9 % bolus 2,000 mL (0 mLs Intravenous Stopped 03/18/17 1803)  fentaNYL (SUBLIMAZE) injection 100 mcg (100 mcg Intravenous Given 03/18/17 1811)  iopamidol (ISOVUE-300) 61 % injection 100 mL (30 mLs Oral Contrast Given 03/18/17 1910)  dicyclomine (BENTYL) capsule 10 mg (10 mg Oral Given 03/18/17 2104)  HYDROcodone-acetaminophen (NORCO/VICODIN) 5-325 MG per tablet 1 tablet (1 tablet Oral Given 03/18/17 2129)     Initial Impression / Assessment and Plan / ED Course  I have reviewed the triage vital signs and the nursing notes.  Pertinent labs & imaging results that were available during my care of the patient were reviewed by me and considered in my medical decision making (see chart for details).     32 year old female who presents for evaluation of rectal bleeding that began today.  Patient states that she felt like she was going to have a bowel movement and when she went to the bathroom, she noted bright red blood per rectum.  Patient reports probably 7 episodes before coming to the emergency department today.  Patient also endorses having some left lower quadrant abdominal pain.   Patient denies any fever, vomiting, decreased appetite.  Patient does not have any history of colitis, ulcers.  Patient denies any chronic use of NSAIDs.  Patient does smoke. Patient is afebrile, non-toxic appearing, sitting comfortably on examination table. Vital signs reviewed and stable.  On initial triage evaluation, patient was slightly hypotensive. IVF given for fluid resuscitation.  Consider GI bleed versus ulcer versus diverticulitis versus hemorrhoids.  Plan for basic labs. Given abdominal tenderness, will also likely need CT abd/pelvis.   I-STAT beta negative.  Fecal occult blood negative.  CMP unremarkable.  CBC shows leukocytosis of 13.0,.  Hemoglobin is 15.8 and hematocrit 47.2.  Platelets are 413.  Repeat vitals show improvement in blood pressure after fluids.  Reevaluation.  Patient still having some abdominal discomfort.  No episodes of rectal bleeding here in the department.  We will plan to get a CT abdomen pelvis for evaluation of diverticular disease.  Reevaluation after pain medication.  She reports that pain medication is significantly improved abdominal pain.  Improved abdominal exam.  CT pending.  CT abdomen pelvis reviewed.  No acute abnormalities to correlate with patient's symptoms.  There is a small amount of air anterior in the bladder but otherwise no acute findings.  Discussed results with patient.  She still having some slight abdominal discomfort though she has an improved abdominal exam.  She has been able to eat in the department without any difficulty.  She is ambulating in the department several times in the bathroom without any difficulty.  She states that she has not had any more episodes of rectal bleeding since being here in the department.  Vital signs are stable.  Given reassuring workup, negative CT, will need outpatient GI follow-up for further evaluation of what was causing her symptoms today.  Discussed plan with patient.  She is agreeable to plan.  RN was  attempting to discharge patient when she told me that she had requesting something for pain.  Patient wanted something stronger for pain.  I told her I could give her some pain medication in the department but not send her home with any prescriptions.  Additionally, patient told the RN that she had had some repeat episodes of rectal bleeding since being in the department..  Patient had previously told me that she had not had any more bleeding.  I personally discussed with patient.  She states that she went to go to the bathroom and had another episode of rectal bleeding.  She states that it was smaller than what it first happened.  She denies any vaginal bleeding and states that when she wipes, she is not having any evidence of vaginal bleeding.  UA sent.  Patient did pull out her own IV and there was some blood on the checks but patient reported that it came from her arm.  Discussed results with patient.  I told her that since she was having repeated bleeding I could call GI doctor and ask for their opinion regarding full plan.  Additionally I told patient that we could stick to the original plan of having patient follow-up with GI and return for any worsening or concerning symptoms.  Patient wished to leave at this time and follow-up with GI. Patient had ample opportunity for questions and discussion. All patient's questions were answered with full understanding. Strict return precautions discussed. Patient expresses understanding and agreement to plan.    Final Clinical Impressions(s) / ED Diagnoses   Final diagnoses:  Rectal bleeding  Abdominal pain, unspecified abdominal location    ED Discharge Orders        Ordered    dicyclomine (BENTYL) 20 MG tablet  2 times daily     03/18/17 2120       Rosana HoesLayden, Lindsey A, PA-C 03/19/17 2136    Bethann BerkshireZammit, Joseph, MD 03/20/17 1901

## 2017-03-18 NOTE — Discharge Instructions (Signed)
As we discussed, your workup in the emergency department did not show any abnormalities.  Your symptoms need to be followed up by gastroenterologist doctor.  I have provided you a referral to 1.  Call and arrange for an appointment.  Tell them that you were seen in the department.  Take the Bentyl as directed for pain.  Return to the emergency department for repeat episodes of blood in her stool, rectal bleeding, abdominal pain, fever, vomiting or any other worsening or concerning symptoms.

## 2017-03-19 NOTE — ED Notes (Addendum)
Staff noticed large amount of blood on pts bed. When questioned about the blood pt reports that she removed one of her IVs. Staff questioned why she felt this was an appropriate action. Pt states " I took it out because it was hurting, but I made a mess all over the bed." Pt informed by staff that it is never appropriate for a pt to remove an IV due to possibility of excessive bleeding. Pt also informed that if she was having pain or any other issue with the IV, she should have let a staff member know so that the issue could be addressed properly.

## 2017-04-03 NOTE — L&D Delivery Note (Signed)
Obstetrical Delivery Note   Date of Delivery:   02/14/2018 Primary OB:   None Gestational Age/EDD: 6995w0d Reason for Admission: Preterm labor in setting of known PPROM at 31/1 weeks. Patient left AMA on 02/13/18 Antepartum complications: PPROM at 31/1, insufficient prenatal care, polysubstance abuse, tobacco abuse  Delivered By:   Deborah Park, Montez HagemanJr MD  Delivery Type:   spontaneous vaginal delivery  Delivery Details:   CTSP for fetal tachycardia and decelerations with preterm contractions. Bedside ultrasound showed cephalic and very low and when checked was +3 to +4, direct OA. Given history and situation, [facility] NICU called and patient had uncomplicated vaginal birth. Cord clamped and cut immediately and handed to NICU team. Placenta out via active third stage with IM pitocin. Anesthesia:    none Intrapartum complications: category II tracing GBS:    Negative Laceration:    Right periuretheral. Patient declines repair. Area is hemostatic Episiotomy:    none Rectal exam:   deferred Placenta:    Delivered and expressed via active management. Intact: yes. To pathology: yes.  Delayed Cord Clamping: no Estimated Blood Loss:  150mL  Baby:    Liveborn female, APGARs 7/8, weight 1570gm Cord gases:  Results for Sharyl NimrodLAWING, BOY Aaliyha (MRN [id number]) as of 02/18/2018 09:23  Ref. Range 02/14/2018 21:25 02/14/2018 21:25  pH cord blood (arterial) Latest Ref Range: 7.210 - 7.380   7.318  pCO2 cord blood (arterial) Latest Ref Range: 42.0 - 56.0 mmHg  45.8  Bicarbonate Latest Ref Range: 13.0 - 22.0 mmol/L 23.8 (H) 22.8 (H)  Ph Cord Blood (Venous) Latest Ref Range: 7.240 - 7.380  7.332   pCO2 Cord Blood (Venous) Latest Ref Range: 42.0 - 56.0  46.3     Cornelia Copaharlie Taiylor Virden, Jr. MD Attending Center [facility](Faculty Practice)

## 2017-12-13 ENCOUNTER — Encounter (HOSPITAL_COMMUNITY): Payer: Self-pay | Admitting: *Deleted

## 2017-12-13 ENCOUNTER — Inpatient Hospital Stay (HOSPITAL_BASED_OUTPATIENT_CLINIC_OR_DEPARTMENT_OTHER): Payer: Medicaid Other

## 2017-12-13 ENCOUNTER — Other Ambulatory Visit: Payer: Self-pay

## 2017-12-13 ENCOUNTER — Inpatient Hospital Stay (HOSPITAL_COMMUNITY)
Admission: AD | Admit: 2017-12-13 | Discharge: 2017-12-15 | DRG: 833 | Disposition: A | Discharge status: Home / Self Care | Payer: Medicaid Other | Attending: Obstetrics and Gynecology | Admitting: Obstetrics and Gynecology

## 2017-12-13 DIAGNOSIS — Z3A23 23 weeks gestation of pregnancy: Secondary | ICD-10-CM | POA: Diagnosis not present

## 2017-12-13 DIAGNOSIS — O4692 Antepartum hemorrhage, unspecified, second trimester: Secondary | ICD-10-CM | POA: Diagnosis present

## 2017-12-13 DIAGNOSIS — O99332 Smoking (tobacco) complicating pregnancy, second trimester: Secondary | ICD-10-CM | POA: Diagnosis present

## 2017-12-13 DIAGNOSIS — F17210 Nicotine dependence, cigarettes, uncomplicated: Secondary | ICD-10-CM | POA: Diagnosis present

## 2017-12-13 DIAGNOSIS — O26872 Cervical shortening, second trimester: Secondary | ICD-10-CM | POA: Diagnosis present

## 2017-12-13 DIAGNOSIS — O09212 Supervision of pregnancy with history of pre-term labor, second trimester: Secondary | ICD-10-CM | POA: Diagnosis not present

## 2017-12-13 DIAGNOSIS — N883 Incompetence of cervix uteri: Secondary | ICD-10-CM | POA: Diagnosis present

## 2017-12-13 LAB — CBC WITH DIFFERENTIAL/PLATELET
Basophils Absolute: 0 10*3/uL (ref 0.0–0.1)
Basophils Relative: 0 %
EOS ABS: 0.2 10*3/uL (ref 0.0–0.7)
Eosinophils Relative: 2 %
HCT: 33.8 % — ABNORMAL LOW (ref 36.0–46.0)
HEMOGLOBIN: 11.4 g/dL — AB (ref 12.0–15.0)
Lymphocytes Relative: 28 %
Lymphs Abs: 3 10*3/uL (ref 0.7–4.0)
MCH: 34.3 pg — ABNORMAL HIGH (ref 26.0–34.0)
MCHC: 33.7 g/dL (ref 30.0–36.0)
MCV: 101.8 fL — ABNORMAL HIGH (ref 78.0–100.0)
Monocytes Absolute: 0.4 10*3/uL (ref 0.1–1.0)
Monocytes Relative: 3 %
NEUTROS PCT: 67 %
Neutro Abs: 7.1 10*3/uL (ref 1.7–7.7)
Platelets: 209 10*3/uL (ref 150–400)
RBC: 3.32 MIL/uL — ABNORMAL LOW (ref 3.87–5.11)
RDW: 13.2 % (ref 11.5–15.5)
WBC: 10.7 10*3/uL — ABNORMAL HIGH (ref 4.0–10.5)

## 2017-12-13 LAB — URINALYSIS, ROUTINE W REFLEX MICROSCOPIC
Bilirubin Urine: NEGATIVE
Glucose, UA: NEGATIVE mg/dL
Hgb urine dipstick: NEGATIVE
Ketones, ur: 5 mg/dL — AB
LEUKOCYTES UA: NEGATIVE
NITRITE: NEGATIVE
PROTEIN: NEGATIVE mg/dL
SPECIFIC GRAVITY, URINE: 1.024 (ref 1.005–1.030)
pH: 6 (ref 5.0–8.0)

## 2017-12-13 LAB — TYPE AND SCREEN
ABO/RH(D): O POS
Antibody Screen: NEGATIVE

## 2017-12-13 MED ORDER — CALCIUM CARBONATE ANTACID 500 MG PO CHEW: 2.0000 | CHEWABLE_TABLET | ORAL | Status: DC | PRN

## 2017-12-13 MED ORDER — PRENATAL MULTIVITAMIN CH
1.0000 | ORAL_TABLET | Freq: Every day | ORAL | Status: DC
  Administered 2017-12-14: 1 via ORAL
  Filled 2017-12-13: qty 1

## 2017-12-13 MED ORDER — NICOTINE 14 MG/24HR TD PT24
14.0000 mg | MEDICATED_PATCH | Freq: Every day | TRANSDERMAL | Status: DC
  Administered 2017-12-13 – 2017-12-14 (×2): 14 mg via TRANSDERMAL
  Filled 2017-12-13 (×4): qty 1

## 2017-12-13 MED ORDER — ACETAMINOPHEN 325 MG PO TABS: 650.0000 mg | ORAL_TABLET | ORAL | Status: DC | PRN

## 2017-12-13 MED ORDER — BETAMETHASONE SOD PHOS & ACET 6 (3-3) MG/ML IJ SUSP
12.0000 mg | INTRAMUSCULAR | Status: AC
  Administered 2017-12-14 (×2): 12 mg via INTRAMUSCULAR
  Filled 2017-12-13 (×2): qty 2

## 2017-12-13 MED ORDER — DOCUSATE SODIUM 100 MG PO CAPS
100.0000 mg | ORAL_CAPSULE | Freq: Every day | ORAL | Status: DC
  Administered 2017-12-14: 100 mg via ORAL
  Filled 2017-12-13: qty 1

## 2017-12-13 NOTE — MAU Note (Signed)
Started bleeding, spotting this morning about 1000.  Has had a slight sharp pain, like a needle on the right side. Is 23wks preg.  No hx of bleeding.

## 2017-12-13 NOTE — Progress Notes (Signed)
WBC return slightly over upper limit of normal (10.7) with no left shift.  Thre was minimal abdominal tenderness on my exam, normal wet prep without any wbcs, no CMT and lack of mucopurulent cervical discharge.  She is currently afebrile with normal pulse.  My suspicion for chorioamnionitis is low at this time.  I feel the benefit of bmz in this patient with a history of a prior delivery < 24 weeks who is 1 cm dilated outweighs the potential risks.  Will order.

## 2017-12-13 NOTE — H&P (Addendum)
32 y.o. [name] @ [redacted]w[redacted]d presents to MAU today with vaginal bleeding.  She reports that she has had intermittent spotting since yesterday evening.  She reports mild low back pain and pelvic pain.  Denies abnormal vaginal discharge/fevers/chills. She denies previous episodes of bleeding in this pregnancy.   She had an ultrasound in MAU that showed a shortened cervix at 1.59 cm.  The sonographer reported that the patient had significant abdominal / pelvic tenderness during exam.    Pregnancy is c/b:  1. History of PTB:  G1 was a PTB around 21-23 weeks due to labor.  G2 was a 36 week delivery.  She has been on 17-P this pregnancy, but has missed multiple injections    Past Medical History:  Diagnosis Date  . Anxiety   . Memory loss 09/17/2013  . Sleep disturbance 09/17/2013   History reviewed. No pertinent surgical history.  OB History  Gravida Para Term Preterm AB Living  4 2 1 1 1 1   SAB TAB Ectopic Multiple Live Births  1       1    # Outcome Date GA Lbr Len/2nd Weight Sex Delivery Anes PTL Lv  4 Current           3 Term 04/22/07    M Vag-Spont   LIV  2 Preterm  [redacted]w[redacted]d       FD  1 SAB             Social History   Socioeconomic History  . Marital status: Single    Spouse name: Not on file  . Number of children: 1  . Years of education: College  . Highest education level: Not on file  Occupational History    Employer: NOT EMPLOYED    Comment: not employed  Social Needs  . Financial resource strain: Not on file  . Food insecurity:    Worry: Not on file    Inability: Not on file  . Transportation needs:    Medical: Not on file    Non-medical: Not on file  Tobacco Use  . Smoking status: Current Every Day Smoker    Packs/day: 1.00    Years: 15.00    Pack years: 15.00    Types: Cigarettes  . Smokeless tobacco: Never Used  Substance and Sexual Activity  . Alcohol use: Not Currently  . Drug use: No  . Sexual activity: Yes    Birth control/protection: None  Lifestyle  .  Physical activity:    Days per week: Not on file    Minutes per session: Not on file  . Stress: Not on file  Social History Narrative   Patient lives at home with son.   Caffeine use; 1-3 cups daily,is right handed   Patient has no known allergies.    Prenatal Transfer Tool  Maternal Diabetes: Not yet completed Genetic Screening: Normal Maternal Ultrasounds/Referrals: Normal Fetal Ultrasounds or other Referrals:  None Maternal Substance Abuse:  Yes:  Type: Smoker Significant Maternal Medications:  None Significant Maternal Lab Results: None    Vitals:   12/13/17 1600 12/13/17 1941  BP: (!) 91/56 102/77  Pulse: 100 75  Resp: 18 15  Temp: 98.6 F (37 C) 98.1 F (36.7 C)  SpO2: 99% 99%     General:  NAD Abdomen:  soft, gravid, mild suprapubic tenderness.  No fundal tenderness SSE:  Vagina pink, no erythema.  Cervix pink without erythema or abnormal discharge.  No blood in vagina.  Cervix not friable SVE: 1/50/high Wet  prep:  Normal epithelial cells, neg for yeast/bv/trich/  No wbcs Ex:  no edema  FHTs:  150s, moderate variability, appropriate for gestational age Toco:  quiet   A/P   32 y.o. [phone] 1977w0d presents with short cervix and vaginal bleeding Per MFM, will admit for observation to rule out chorioamnionitis Will check cbc with diff, gc/ct, ucx.  Wet prep normal with no wbcs.  Of note, patient reports that her wbc is always elevated at approximately 15 for the last 5 years.  Denies work up.  Serial abdominal exams. There is mild suprapubic tenderness, but no fundal tenderness or cervical motion tenderness.  If cbc is reassuring, will start bmz series given sve 1cm.  Continuous toco.  TID NST or more frequent monitoring if bleeding returns.   Per MFM, if stable at 48  (with no further bleeding and if pain resolves), may be a candidate for a cerclage.   Smoker: nicotine patch  Of note, US also showed right sided pylectasis at 5 mm.  Will need f/u at 32 weeks.   [facility] Rehabilitation CenterDYANNA Park The Tim[facility] CompanyCLARK

## 2017-12-13 NOTE — MAU Provider Note (Addendum)
History     CSN: [id number]  Arrival date and time: 12/13/17 1548   First Provider Initiated Contact with Patient 12/13/17 1623      Chief Complaint  Patient presents with  . Vaginal Bleeding  . Abdominal Pain   HPI  [name] is a 32 y.o. G4P1011 at [redacted]w[redacted]d who presents to MAU today with complaint of spotting since this afternoon. She denies previous episodes of bleeding in this pregnancy. She states US in the office has been normal. She reports intermittent sharp pains with occasional tightening. She is unsure about contractions. She states pain was worse yesterday. She has not taken anything for pain. She denies recent intercourse, UTI symptoms, N/V/D or constipation. She reports normal fetal movement.   OB History    Gravida  4   Para  2   Term  1   Preterm  1   AB  1   Living  1     SAB  1   TAB      Ectopic      Multiple      Live Births  1           Past Medical History:  Diagnosis Date  . Anxiety   . Memory loss 09/17/2013  . Sleep disturbance 09/17/2013    History reviewed. No pertinent surgical history.  Family History  Problem Relation Age of Onset  . Healthy Mother   . Healthy Father   . Bipolar disorder Maternal Grandmother   . Seizures Neg Hx   . Parkinsonism Neg Hx   . Multiple sclerosis Neg Hx   . Dementia Neg Hx     Social History   Tobacco Use  . Smoking status: Current Every Day Smoker    Packs/day: 1.00    Years: 15.00    Pack years: 15.00    Types: Cigarettes  . Smokeless tobacco: Never Used  Substance Use Topics  . Alcohol use: Not Currently  . Drug use: No    Allergies: No Known Allergies  Medications Prior to Admission  Medication Sig Dispense Refill Last Dose  . cyclobenzaprine (FLEXERIL) 10 MG tablet Take 1 tablet (10 mg total) by mouth 3 (three) times daily as needed for muscle spasms (and pain). (Patient not taking: Reported on 03/18/2017) 20 tablet 0 Not Taking at Unknown time  . dicyclomine  (BENTYL) 20 MG tablet Take 1 tablet (20 mg total) by mouth 2 (two) times daily. 20 tablet 0   . HYDROcodone-acetaminophen (NORCO/VICODIN) 5-325 MG tablet Take 1-2 tablets by mouth every 4 (four) hours as needed for moderate pain or severe pain. (Patient not taking: Reported on 03/18/2017) 15 tablet 0 Not Taking at Unknown time  . modafinil (PROVIGIL) 100 MG tablet Take 1 tablet (100 mg total) by mouth 2 (two) times daily. First dose in AM and second dose no later than 3PM (Patient not taking: Reported on 03/18/2017) 60 tablet 5 Not Taking at Unknown time    Review of Systems  Constitutional: Negative for fever.  Gastrointestinal: Positive for abdominal pain. Negative for constipation, diarrhea, nausea and vomiting.  Genitourinary: Positive for vaginal bleeding. Negative for dysuria, frequency, urgency and vaginal discharge.   Physical Exam   Blood pressure (!) 91/56, pulse 100, temperature 98.6 F (37 C), temperature source Oral, resp. rate 18, weight 52.8 kg, last menstrual period 01/01/2017, SpO2 99 %.  Physical Exam  Nursing note and vitals reviewed. Constitutional: She is oriented to person, place, and time. She appears well-developed  and well-nourished. No distress.  HENT:  Head: Normocephalic and atraumatic.  Cardiovascular: Normal rate.  Respiratory: Effort normal.  GI: Soft. She exhibits no distension. There is no tenderness. There is no rebound and no guarding.  Neurological: She is alert and oriented to person, place, and time.  Skin: Skin is warm and dry. No erythema.  Psychiatric: She has a normal mood and affect.    No results found for this or any previous visit (from the past 24 hour(s)).  Fetal Monitoring: Baseline: 140 bpm Variability: moderate Accelerations: 10 x 10 Decelerations: none Contractions: none  MAU Course  Procedures  OBSTETRICS REPORT                       (Signed Final 12/13/2017 05:46  pm) ---------------------------------------------------------------------- Patient Info  ID #:       [id number]                          D.O.B.:  December 25, 1985 (32 yrs)  Name:       Deborah FreibergSAMANTHA A Park               Visit Date: 12/13/2017 04:52 pm ---------------------------------------------------------------------- Performed By  Performed By:     Fayne Norriearolyn Isley BS,      Referred By:      MAU Nursing-                    RDMS, RVT                                MAU/Triage  Attending:        Patsi Searsobert L Jacobson      [facility]                    MD ---------------------------------------------------------------------- Orders   #  Description                          Code         Ordered By   1  US MFM OB LIMITED                    [id number]     Vonzella NippleJULIE WENZEL  ----------------------------------------------------------------------   #  Order #                    Accession #                 Episode #   1  [id number]                  [id number]                  [id number]  ---------------------------------------------------------------------- Indications   [redacted] weeks gestation of pregnancy                Z3A.23   Vaginal bleeding in pregnancy, second          O46.92   trimester   Poor obstetric history: Previous preterm       O09.219   delivery, antepartum  ---------------------------------------------------------------------- Fetal Evaluation  Num Of Fetuses:         1  Fetal Heart Rate(bpm):  148  Cardiac Activity:       Observed  Presentation:           Cephalic  Placenta:               Anterior;  right lateral  P. Cord Insertion:      Not well visualized  Amniotic Fluid  AFI FV:      Within normal limits                              Largest Pocket(cm)                              6.35 ---------------------------------------------------------------------- Biometry  LV:          4  mm ---------------------------------------------------------------------- OB History   Gravidity:    4         Term:   1         SAB:   1  TOP:          1        Living:  1 ---------------------------------------------------------------------- Gestational Age  LMP:           23w 0d        Date:  07/05/17                 EDD:   04/11/18  Best:          23w 0d     Det. By:  LMP  (07/05/17)          EDD:   04/11/18 ---------------------------------------------------------------------- Anatomy  Ventricles:            Appears normal         Abdominal Wall:         Appears nml (cord                                                                        insert, abd wall)  Heart:                 Appears normal         Kidneys:                Right sided                         (4CH, axis, and                         situs)                                                                        pyelectasis, 5  mm  Stomach:               Appears normal, left  sided ---------------------------------------------------------------------- Cervix Uterus Adnexa  Cervix  Length:           1.59  cm.  Funneling of internal os. ---------------------------------------------------------------------- Comments  U/S images reviewed.   No evidence of abruption or placenta  previa is found on today's U/S.  Debris is not seen in the  lower uterine segment   Sonographer reports patient is very  tender abdominally and cervically.  Cervix is shortened  measuring 1.59 cm in length.  Findings discussed with Dr. Alla Feeling.  Recommendations: 1) Evaluate for chorioamnionitis.  If pain  and bleeding subside, consider cerclage placement in 48  hours. ---------------------------------------------------------------------- Recommendations  1) Evaluate for chorioamnionitis.  If pain and bleeding  subside, consider cerclage placement in 48 hours. ----------------------------------------------------------------------   MDM [place] ordered prior to exam  1650 - care turned over to Nolene Bernheim, NP [phone]  - Returned from ultrasound - in to see client.  Reviewed ultrasound with her.  Client aware that cervix was shortened.  Dr. Chestine Spore to come to see client. Currently no contractions.  Having some low back pain stating bed is uncomfortable - changed to side lying.  FHT monitor will be adjusted.   Vonzella Nipple, PA-C 12/13/2017, 4:50 PM  Assessment and Plan  Shortened Cervix  Plan care assumed by Dr. Chestine Spore at 1813  Nolene Bernheim, RN, MSN, NP-BC Nurse Practitioner, [facility] for [facility], [facility] 12/13/2017 6:13 PM

## 2017-12-14 DIAGNOSIS — N883 Incompetence of cervix uteri: Secondary | ICD-10-CM | POA: Diagnosis present

## 2017-12-14 LAB — GC/CHLAMYDIA PROBE AMP (~~LOC~~) NOT AT ARMC
Chlamydia: NEGATIVE
Neisseria Gonorrhea: NEGATIVE

## 2017-12-14 NOTE — Progress Notes (Addendum)
32 y.o. [id number] 8634w1d HD#1 admitted for short cervix, tender abdomen and irritability.  Pt currently stable with no c/o this am.  Fair FM today.  Not feeling any contractions.  Vitals:   12/13/17 1600 12/13/17 1941 12/14/17 0523  BP: (!) 91/56 102/77 (!) 96/59  Pulse: 100 75 72  Resp: 18 15 16   Temp: 98.6 F (37 C) 98.1 F (36.7 C) 98 F (36.7 C)  TempSrc: Oral Oral Oral  SpO2: 99% 99% 97%  Weight: 52.8 kg    Height: 5\' 2"  (1.575 m)      Lungs CTA Cor RRR Abd  Soft, gravid, nontender Ex SCDs FHTs present Toco  Sometimes contractions but irritability present most of the time.  Results for orders placed or performed during the hospital encounter of 12/13/17 (from the past 24 hour(s))  Urinalysis, Routine w reflex microscopic     Status: Abnormal   Collection Time: 12/13/17  4:05 PM  Result Value Ref Range   Color, Urine YELLOW YELLOW   APPearance HAZY (A) CLEAR   Specific Gravity, Urine 1.024 1.005 - 1.030   pH 6.0 5.0 - 8.0   Glucose, UA NEGATIVE NEGATIVE mg/dL   Hgb urine dipstick NEGATIVE NEGATIVE   Bilirubin Urine NEGATIVE NEGATIVE   Ketones, ur 5 (A) NEGATIVE mg/dL   Protein, ur NEGATIVE NEGATIVE mg/dL   Nitrite NEGATIVE NEGATIVE   Leukocytes, UA NEGATIVE NEGATIVE  Type and screen [facility]     Status: None   Collection Time: 12/13/17  8:19 PM  Result Value Ref Range   ABO/RH(D) O POS    Antibody Screen NEG    Sample Expiration      12/16/2017 Performed at [facility], [address] [place], [place] [address]   CBC with Differential     Status: Abnormal   Collection Time: 12/13/17  8:19 PM  Result Value Ref Range   WBC 10.7 (H) 4.0 - 10.5 K/uL   RBC 3.32 (L) 3.87 - 5.11 MIL/uL   Hemoglobin 11.4 (L) 12.0 - 15.0 g/dL   HCT 81.133.8 (L) 91.436.0 - 78.246.0 %   MCV 101.8 (H) 78.0 - 100.0 fL   MCH 34.3 (H) 26.0 - 34.0 pg   MCHC 33.7 30.0 - 36.0 g/dL   RDW 95.613.2 21.311.5 - 08.615.5 %   Platelets 209 150 - 400 K/uL   Neutrophils Relative % 67 %   Neutro Abs 7.1 1.7 - 7.7 K/uL   Lymphocytes Relative 28 %   Lymphs Abs 3.0 0.7 - 4.0 K/uL   Monocytes Relative 3 %   Monocytes Absolute 0.4 0.1 - 1.0 K/uL   Eosinophils Relative 2 %   Eosinophils Absolute 0.2 0.0 - 0.7 K/uL   Basophils Relative 0 %   Basophils Absolute 0.0 0.0 - 0.1 K/uL    A:  HD#1  6534w1d with short cervix.  Pt has no clear signs of infection.  She does have irritability which occasionally is contractions.  P: Observation for now.  BMZ course started.  At 23 weeks with irritability, I do not believe this pt is a candidate for cerclage.  Would consider instead magnesium sulfate if pt has signs of preterm labor closer to viability.  Could consider procardia now if pt feels contractions.    Perl Folmar A

## 2017-12-15 LAB — URINE CULTURE: Culture: NO GROWTH

## 2017-12-15 MED ORDER — NICOTINE 14 MG/24HR TD PT24: 14.0000 mg | MEDICATED_PATCH | Freq: Every day | TRANSDERMAL | 0 refills | Status: DC

## 2017-12-15 NOTE — Progress Notes (Signed)
32 y.o. [id number] 7015w2d HD#2 admitted for short cervix.  Pt currently stable with no c/o.  Good FM.  Vitals:   12/14/17 1520 12/14/17 2017 12/14/17 2339 12/15/17 0416  BP: 117/77 (!) 96/43 107/72 (!) 82/47  Pulse: 78 77 83 89  Resp: 18 16 18 16   Temp: 98 F (36.7 C) 98.2 F (36.8 C) 98.8 F (37.1 C) 98.2 F (36.8 C)  TempSrc: Oral Oral Oral Oral  SpO2:  98% 99% 99%  Weight:      Height:        Lungs CTA Cor RRR Abd  Soft, gravid, nontender Ex SCDs FHTs  140s, Toco  irritability  No results found for this or any previous visit (from the past 24 hour(s)).  A:  HD#2  6515w2d with short cervix and irritability.  P: I d/w MFM yesterday that a cerclage would be reasonable but that it was not necessary.  Pt has been offered cerclage but declines.  Pt stayed to get second betamethasone shot late last night.  Although irritability pattern is still present, she does not feel contractions.  Will d/c this am with instructions for [facility] as that will be the best help to prevent preterm labor.  F/w in office in one week.  Labor precautions given.    Blayke Pinera A

## 2017-12-15 NOTE — Progress Notes (Signed)
Pt not in room or on unit for assessment. Toco is laying on bed.  Pt has previously been instructed not to leave the unit.

## 2017-12-15 NOTE — Discharge Summary (Signed)
Physician Discharge Summary  Patient ID: Deborah Park MRN: [id number] DOB/AGE: Dec 21, 1985 32 y.o.  Admit date: 12/13/2017 Discharge date: 12/15/2017  Admission Diagnoses:shortened cervix, tender abdomen and cervix  Discharge Diagnoses:  Active Problems:   Short cervix during pregnancy in second trimester   Short cervix ruled out chorioamnionitis  Discharged Condition: good  Hospital Course: Pt admitted for cervix 1.59 cm and tender cervix and abdomen.  Chorio was ruled out.  GC/CT were negative.  Pt was given two doses of betamethasone.  She was offered a cerclage but declined.  Pt had irritability only on toco and did not feel any contractions.  Pt d/ced on HD 3.  Consults: MFM  Significant Diagnostic Studies: labs:   Results for orders placed or performed during the hospital encounter of 12/13/17 (from the past 72 hour(s))  GC/Chlamydia probe amp ()not at [facility]     Status: None   Collection Time: 12/13/17 12:00 AM  Result Value Ref Range   Chlamydia Negative     Comment: Normal Reference Range - Negative   Neisseria gonorrhea Negative     Comment: Normal Reference Range - Negative  Urinalysis, Routine w reflex microscopic     Status: Abnormal   Collection Time: 12/13/17  4:05 PM  Result Value Ref Range   Color, Urine YELLOW YELLOW   APPearance HAZY (A) CLEAR   Specific Gravity, Urine 1.024 1.005 - 1.030   pH 6.0 5.0 - 8.0   Glucose, UA NEGATIVE NEGATIVE mg/dL   Hgb urine dipstick NEGATIVE NEGATIVE   Bilirubin Urine NEGATIVE NEGATIVE   Ketones, ur 5 (A) NEGATIVE mg/dL   Protein, ur NEGATIVE NEGATIVE mg/dL   Nitrite NEGATIVE NEGATIVE   Leukocytes, UA NEGATIVE NEGATIVE    Comment: Performed at [facility], [address], [place], [place] [address]  Urine culture     Status: None   Collection Time: 12/13/17  6:48 PM  Result Value Ref Range   Specimen Description      URINE, CLEAN CATCH Performed at [facility], [address], [place],  [place] [address]    Special Requests      NONE Performed at [facility], [address], [place], [place] [address]    Culture      NO GROWTH Performed at [facility], [address], [place], [place] [address]    Report Status 12/15/2017 FINAL   Type and screen [facility]     Status: None   Collection Time: 12/13/17  8:19 PM  Result Value Ref Range   ABO/RH(D) O POS    Antibody Screen NEG    Sample Expiration      12/16/2017 Performed at [facility], [address], [place], [place] [address]   CBC with Differential     Status: Abnormal   Collection Time: 12/13/17  8:19 PM  Result Value Ref Range   WBC 10.7 (H) 4.0 - 10.5 K/uL   RBC 3.32 (L) 3.87 - 5.11 MIL/uL   Hemoglobin 11.4 (L) 12.0 - 15.0 g/dL   HCT 69.6 (L) 29.5 - 28.4 %   MCV 101.8 (H) 78.0 - 100.0 fL   MCH 34.3 (H) 26.0 - 34.0 pg   MCHC 33.7 30.0 - 36.0 g/dL   RDW 13.2 44.0 - 10.2 %   Platelets 209 150 - 400 K/uL   Neutrophils Relative % 67 %   Neutro Abs 7.1 1.7 - 7.7 K/uL   Lymphocytes Relative 28 %   Lymphs Abs 3.0 0.7 -  4.0 K/uL   Monocytes Relative 3 %   Monocytes Absolute 0.4 0.1 - 1.0 K/uL   Eosinophils Relative 2 %   Eosinophils Absolute 0.2 0.0 - 0.7 K/uL   Basophils Relative 0 %   Basophils Absolute 0.0 0.0 - 0.1 K/uL    Comment: Performed at [facility], [address], [place], [place] [address]    Treatments: betamethasone  Discharge Exam: Blood pressure (!) 93/57, pulse 74, temperature 98.4 F (36.9 C), temperature source Oral, resp. rate 16, height 5\' 2"  (1.575 m), weight 52.8 kg, last menstrual period 01/01/2017, SpO2 99 %.   Disposition: Discharge disposition: 01-Home or Self Care       Discharge Instructions    Discharge activity:  Up to eat   Complete by:  As directed    Discharge diet:  No restrictions   Complete by:  As directed    Discharge instructions   Complete by:  As directed    Modified bedrest.  Refrain from intercourse.  Count  baby's movements in 1 hour per day- if you don't get 6 in that hour, call.   Do not have sex or do anything that might make you have an orgasm   Complete by:  As directed    Fetal Kick Count:  Lie on our left side for one hour after a meal, and count the number of times your baby kicks.  If it is less than 5 times, get up, move around and drink some juice.  Repeat the test 30 minutes later.  If it is still less than 5 kicks in an hour, notify your doctor.   Complete by:  As directed    LABOR:  When conractions begin, you should start to time them from the beginning of one contraction to the beginning  of the next.  When contractions are 5 - 10 minutes apart or less and have been regular for at least an hour, you should call your health care provider.   Complete by:  As directed    Notify physician for bleeding from the vagina   Complete by:  As directed    Notify physician for blurring of vision or spots before the eyes   Complete by:  As directed    Notify physician for chills or fever   Complete by:  As directed    Notify physician for fainting spells, "black outs" or loss of consciousness   Complete by:  As directed    Notify physician for increase in vaginal discharge   Complete by:  As directed    Notify physician for leaking of fluid   Complete by:  As directed    Notify physician for pain or burning when urinating   Complete by:  As directed    Notify physician for pelvic pressure (sudden increase)   Complete by:  As directed    Notify physician for severe or continued nausea or vomiting   Complete by:  As directed    Notify physician for sudden gushing of fluid from the vagina (with or without continued leaking)   Complete by:  As directed    Notify physician for sudden, constant, or occasional abdominal pain   Complete by:  As directed    Notify physician if baby moving less than usual   Complete by:  As directed      Allergies as of 12/15/2017   No Known Allergies      Medication List    TAKE these medications   multivitamin-prenatal  27-0.8 MG Tabs tablet Take 1 tablet by mouth daily at 12 noon.   nicotine 14 mg/24hr patch Commonly known as:  NICODERM CQ - dosed in mg/24 hours Place 1 patch (14 mg total) onto the skin daily.      Follow-up Information    [facility](s).   Contact information: [address] [place] [address] [phone]           Signed: Loney LaurenceHORVATH,Aiden Helzer A 12/15/2017, 9:14 AM

## 2018-01-30 ENCOUNTER — Encounter: Payer: Medicaid Other | Admitting: Obstetrics & Gynecology

## 2018-02-08 ENCOUNTER — Encounter (HOSPITAL_COMMUNITY): Payer: Self-pay

## 2018-02-08 ENCOUNTER — Inpatient Hospital Stay (HOSPITAL_BASED_OUTPATIENT_CLINIC_OR_DEPARTMENT_OTHER): Payer: Medicaid Other

## 2018-02-08 ENCOUNTER — Inpatient Hospital Stay (HOSPITAL_COMMUNITY)
Admission: AD | Admit: 2018-02-08 | Discharge: 2018-02-13 | DRG: 832 | Discharge status: Against Medical Advice | Payer: Medicaid Other | Attending: Obstetrics and Gynecology | Admitting: Obstetrics and Gynecology

## 2018-02-08 ENCOUNTER — Other Ambulatory Visit: Payer: Self-pay

## 2018-02-08 DIAGNOSIS — O4290 Premature rupture of membranes, unspecified as to length of time between rupture and onset of labor, unspecified weeks of gestation: Secondary | ICD-10-CM | POA: Diagnosis present

## 2018-02-08 DIAGNOSIS — Z363 Encounter for antenatal screening for malformations: Secondary | ICD-10-CM

## 2018-02-08 DIAGNOSIS — O42919 Preterm premature rupture of membranes, unspecified as to length of time between rupture and onset of labor, unspecified trimester: Secondary | ICD-10-CM | POA: Diagnosis present

## 2018-02-08 DIAGNOSIS — O99333 Smoking (tobacco) complicating pregnancy, third trimester: Secondary | ICD-10-CM | POA: Diagnosis present

## 2018-02-08 DIAGNOSIS — O26873 Cervical shortening, third trimester: Secondary | ICD-10-CM | POA: Diagnosis present

## 2018-02-08 DIAGNOSIS — O42913 Preterm premature rupture of membranes, unspecified as to length of time between rupture and onset of labor, third trimester: Secondary | ICD-10-CM | POA: Diagnosis present

## 2018-02-08 DIAGNOSIS — O99343 Other mental disorders complicating pregnancy, third trimester: Secondary | ICD-10-CM | POA: Diagnosis present

## 2018-02-08 DIAGNOSIS — F419 Anxiety disorder, unspecified: Secondary | ICD-10-CM | POA: Diagnosis present

## 2018-02-08 DIAGNOSIS — Z3A32 32 weeks gestation of pregnancy: Secondary | ICD-10-CM | POA: Diagnosis not present

## 2018-02-08 DIAGNOSIS — Z3A31 31 weeks gestation of pregnancy: Secondary | ICD-10-CM | POA: Diagnosis not present

## 2018-02-08 DIAGNOSIS — F17210 Nicotine dependence, cigarettes, uncomplicated: Secondary | ICD-10-CM | POA: Diagnosis present

## 2018-02-08 LAB — HEMOGLOBIN A1C
Hgb A1c MFr Bld: 4.6 % — ABNORMAL LOW (ref 4.8–5.6)
Mean Plasma Glucose: 85.32 mg/dL

## 2018-02-08 LAB — URINALYSIS, ROUTINE W REFLEX MICROSCOPIC
BILIRUBIN URINE: NEGATIVE
GLUCOSE, UA: NEGATIVE mg/dL
HGB URINE DIPSTICK: NEGATIVE
Ketones, ur: 80 mg/dL — AB
Nitrite: NEGATIVE
PROTEIN: 100 mg/dL — AB
Specific Gravity, Urine: 1.02 (ref 1.005–1.030)
pH: 6 (ref 5.0–8.0)

## 2018-02-08 LAB — TYPE AND SCREEN
ABO/RH(D): O POS
Antibody Screen: NEGATIVE

## 2018-02-08 LAB — CBC
HEMATOCRIT: 38.5 % (ref 36.0–46.0)
Hemoglobin: 12.9 g/dL (ref 12.0–15.0)
MCH: 33.8 pg (ref 26.0–34.0)
MCHC: 33.5 g/dL (ref 30.0–36.0)
MCV: 100.8 fL — AB (ref 80.0–100.0)
Platelets: 269 10*3/uL (ref 150–400)
RBC: 3.82 MIL/uL — ABNORMAL LOW (ref 3.87–5.11)
RDW: 12.9 % (ref 11.5–15.5)
WBC: 12.7 10*3/uL — AB (ref 4.0–10.5)
nRBC: 0 % (ref 0.0–0.2)

## 2018-02-08 LAB — AMNISURE RUPTURE OF MEMBRANE (ROM) NOT AT ARMC: AMNISURE: POSITIVE

## 2018-02-08 MED ORDER — ACETAMINOPHEN 325 MG PO TABS
650.0000 mg | ORAL_TABLET | ORAL | Status: DC | PRN
  Administered 2018-02-11 – 2018-02-12 (×2): 650 mg via ORAL
  Filled 2018-02-08 (×2): qty 2

## 2018-02-08 MED ORDER — ZOLPIDEM TARTRATE 5 MG PO TABS: 5.0000 mg | ORAL_TABLET | Freq: Every evening | ORAL | Status: DC | PRN

## 2018-02-08 MED ORDER — MAGNESIUM SULFATE BOLUS VIA INFUSION
4.0000 g | Freq: Once | INTRAVENOUS | Status: AC
  Administered 2018-02-08 (×2): 4 g via INTRAVENOUS
  Filled 2018-02-08: qty 500

## 2018-02-08 MED ORDER — DOCUSATE SODIUM 100 MG PO CAPS
100.0000 mg | ORAL_CAPSULE | Freq: Every day | ORAL | Status: DC
  Administered 2018-02-09 – 2018-02-13 (×5): 100 mg via ORAL
  Filled 2018-02-08 (×5): qty 1

## 2018-02-08 MED ORDER — PRENATAL MULTIVITAMIN CH
1.0000 | ORAL_TABLET | Freq: Every day | ORAL | Status: DC
  Administered 2018-02-09 – 2018-02-13 (×6): 1 via ORAL
  Filled 2018-02-08 (×6): qty 1

## 2018-02-08 MED ORDER — AMOXICILLIN 500 MG PO CAPS
500.0000 mg | ORAL_CAPSULE | Freq: Three times a day (TID) | ORAL | Status: DC
  Administered 2018-02-10 – 2018-02-13 (×10): 500 mg via ORAL
  Filled 2018-02-08 (×10): qty 1

## 2018-02-08 MED ORDER — BETAMETHASONE SOD PHOS & ACET 6 (3-3) MG/ML IJ SUSP
12.0000 mg | INTRAMUSCULAR | Status: AC
  Administered 2018-02-08 – 2018-02-09 (×2): 12 mg via INTRAMUSCULAR
  Filled 2018-02-08 (×2): qty 2

## 2018-02-08 MED ORDER — CALCIUM CARBONATE ANTACID 500 MG PO CHEW
2.0000 | CHEWABLE_TABLET | ORAL | Status: DC | PRN
  Administered 2018-02-10: 400 mg via ORAL
  Filled 2018-02-08: qty 2

## 2018-02-08 MED ORDER — SODIUM CHLORIDE 0.9 % IV SOLN
250.0000 mg | Freq: Four times a day (QID) | INTRAVENOUS | Status: AC
  Administered 2018-02-08 – 2018-02-10 (×7): 250 mg via INTRAVENOUS
  Filled 2018-02-08 (×9): qty 5

## 2018-02-08 MED ORDER — CALCIUM CARBONATE ANTACID 500 MG PO CHEW: 2.0000 | CHEWABLE_TABLET | ORAL | Status: DC | PRN

## 2018-02-08 MED ORDER — ACETAMINOPHEN 325 MG PO TABS: 650.0000 mg | ORAL_TABLET | ORAL | Status: DC | PRN

## 2018-02-08 MED ORDER — DOCUSATE SODIUM 100 MG PO CAPS
100.0000 mg | ORAL_CAPSULE | Freq: Every day | ORAL | Status: DC
  Filled 2018-02-08: qty 1

## 2018-02-08 MED ORDER — HYDROXYZINE HCL 10 MG PO TABS
10.0000 mg | ORAL_TABLET | Freq: Four times a day (QID) | ORAL | Status: DC | PRN
  Filled 2018-02-08: qty 1

## 2018-02-08 MED ORDER — SODIUM CHLORIDE 0.9 % IV SOLN
2.0000 g | Freq: Four times a day (QID) | INTRAVENOUS | Status: DC
  Filled 2018-02-08: qty 2000

## 2018-02-08 MED ORDER — ERYTHROMYCIN BASE 250 MG PO TABS
250.0000 mg | ORAL_TABLET | Freq: Four times a day (QID) | ORAL | Status: DC
  Administered 2018-02-10 – 2018-02-13 (×14): 250 mg via ORAL
  Filled 2018-02-08 (×20): qty 1

## 2018-02-08 MED ORDER — MAGNESIUM SULFATE 40 G IN LACTATED RINGERS - SIMPLE
2.0000 g/h | INTRAVENOUS | Status: AC
  Filled 2018-02-08 (×2): qty 500

## 2018-02-08 MED ORDER — SODIUM CHLORIDE 0.9 % IV SOLN
2.0000 g | Freq: Four times a day (QID) | INTRAVENOUS | Status: AC
  Administered 2018-02-08 – 2018-02-10 (×7): 2 g via INTRAVENOUS
  Filled 2018-02-08: qty 2
  Filled 2018-02-08: qty 2000
  Filled 2018-02-08 (×6): qty 2

## 2018-02-08 MED ORDER — LACTATED RINGERS IV SOLN
INTRAVENOUS | Status: DC
  Administered 2018-02-08 – 2018-02-09 (×3): via INTRAVENOUS

## 2018-02-08 MED ORDER — PRENATAL MULTIVITAMIN CH: 1.0000 | ORAL_TABLET | Freq: Every day | ORAL | Status: DC

## 2018-02-08 NOTE — Progress Notes (Signed)
Pt told the importance of staying on monitor  Pt moving a lot in bed and displacing monitor   The volume is adjusted to allow pt  To hear heart beat

## 2018-02-08 NOTE — MAU Provider Note (Signed)
History     CSN: [id number]  Arrival date and time: 02/08/18 1038   First Provider Initiated Contact with Patient 02/08/18 1150      Chief Complaint  Patient presents with  . Rupture of Membranes   HPI Deborah Park is a 32 y.o. G3P1101 at [redacted]w[redacted]d who presents with possible rupture of membranes. She states she had a big gush of fluid at 0900 and has continued to leak clear fluid since then. Denies any bleeding. Denies any pain or contractions. Reports normal fetal movement. She was admitted to [facility] in September for vaginal bleeding and a short cervix at 24 weeks and was given BMZ. She denies any other problems in the pregnancy.   OB History    Gravida  3   Para  2   Term  1   Preterm  1   AB  0   Living  1     SAB  0   TAB      Ectopic      Multiple      Live Births  1           Past Medical History:  Diagnosis Date  . Anxiety   . Memory loss 09/17/2013  . Sleep disturbance 09/17/2013    History reviewed. No pertinent surgical history.  Family History  Problem Relation Age of Onset  . Healthy Mother   . Healthy Father   . Bipolar disorder Maternal Grandmother   . Seizures Neg Hx   . Parkinsonism Neg Hx   . Multiple sclerosis Neg Hx   . Dementia Neg Hx     Social History   Tobacco Use  . Smoking status: Current Every Day Smoker    Packs/day: 0.50    Years: 15.00    Pack years: 7.50    Types: Cigarettes  . Smokeless tobacco: Never Used  Substance Use Topics  . Alcohol use: Not Currently  . Drug use: No    Allergies: No Known Allergies  Medications Prior to Admission  Medication Sig Dispense Refill Last Dose  . nicotine (NICODERM CQ - DOSED IN MG/24 HOURS) 14 mg/24hr patch Place 1 patch (14 mg total) onto the skin daily. 28 patch 0   . Prenatal Vit-Fe Fumarate-FA (MULTIVITAMIN-PRENATAL) 27-0.8 MG TABS tablet Take 1 tablet by mouth daily at 12 noon.   12/13/2017    Review of Systems  Constitutional: Negative.  Negative for fatigue  and fever.  HENT: Negative.   Respiratory: Negative.  Negative for shortness of breath.   Cardiovascular: Negative.  Negative for chest pain.  Gastrointestinal: Negative.  Negative for abdominal pain, constipation, diarrhea, nausea and vomiting.  Genitourinary: Positive for vaginal discharge. Negative for dysuria and vaginal bleeding.  Neurological: Negative.  Negative for dizziness and headaches.   Physical Exam   Blood pressure 109/70, pulse 93, temperature 98 F (36.7 C), temperature source Oral, resp. rate 17, weight 53 kg, last menstrual period 01/01/2017, SpO2 100 %.  Physical Exam  Nursing note and vitals reviewed. Constitutional: She is oriented to person, place, and time. She appears well-developed and well-nourished. No distress.  HENT:  Head: Normocephalic.  Eyes: Pupils are equal, round, and reactive to light.  Cardiovascular: Normal rate, regular rhythm and normal heart sounds.  Respiratory: Effort normal and breath sounds normal. No respiratory distress.  GI: Soft. Bowel sounds are normal. She exhibits no distension. There is no tenderness.  Genitourinary:  Genitourinary Comments: Copious clear fluid in vaginal vault  Neurological: She is alert  and oriented to person, place, and time.  Skin: Skin is warm and dry.  Psychiatric: She has a normal mood and affect. Her behavior is normal. Judgment and thought content normal.   MAU Course  Procedures  MDM Prenatal records from private office not on file. Pregnancy complicated by previous 23w loss and shortened cervix at 24 weeks. Labs ordered and reviewed.  UA SSE- copious clear fluid pouring from speculum. VE deferred at this time Amnisure  Consulted with Dr. Earlene Plater regarding exam- recommends inpatient admission and recuse course of BMZ. Will call private MD  Dr. Claiborne Billings called- will admit patient to [facility], start IV antibiotics and rescue course of BMZ. Vertex presentation confirmed with bedside ultrasound.    Assessment and Plan   1. Preterm premature rupture of membranes, unspecified duration to onset of labor   2. [redacted] weeks gestation of pregnancy    -Admit to [facility] -Care turned over to MD  Rolm Bookbinder CNM 02/08/2018, 11:50 AM

## 2018-02-08 NOTE — Consult Note (Signed)
Neonatology Consult to Antenatal Patient:  I was asked by Dr. Earlene Plater to see this patient in order to provide antenatal counseling due to prematurity.  Ms. Haws was admitted 11/8 at 31 1/[redacted] weeks GA due to PROM. She is currently not having labor. She is getting BMZ and IV Ampicillin.  Good fetal movement. PMH complicated by tobacco use, anxiety.  MFM [place] today shows EFW 1893g; report pending.   I spoke with the patient and FOB. We discussed the worst case of delivery in the next 1-2 days, including usual DR management, possible respiratory complications and need for support, IV access, feedings (mother desires breast feeding, which was encouraged and use of donor breast milk discussed), LOS, Mortality and Morbidity, and long term outcomes.  In answered questions they had at this time.  They may tour NICU if desired and I would be glad to come back if they have more questions later.  Thank you for asking me to see this patient.  Dineen Kid Leary Roca, MD Neonatologist  The total length of face-to-face or floor/unit time for this encounter was 25 minutes. Counseling and/or coordination of care was 40 minutes of the above.

## 2018-02-08 NOTE — MAU Note (Signed)
Been here before for preterm  Issues.  Is losing fluid today (0900). Clear fluid still coming.  States this is what happened before, but  Was not ruptured. No pain or bleeding.

## 2018-02-08 NOTE — H&P (Signed)
Obstetric History and Physical  Deborah Park is a 32 y.o. G3P1101 with IUP at 102w1d presenting for leaking of fluid since 9 am. Denies bleeding, cramping, contractions. Reports normal fetal movement.  Prenatal Course Source of Care: [facility] however has not seen them since 12/2017, states she is trying to establish care with [facility] Pregnancy complications or risks: Patient Active Problem List   Diagnosis Date Noted  . Preterm premature rupture of membranes (PPROM) with unknown onset of labor 02/08/2018  . Premature rupture of membranes 02/08/2018  . Short cervix 12/14/2017  . Short cervix during pregnancy in second trimester 12/13/2017  . Memory loss 09/17/2013  . Sleep disturbance 09/17/2013    Prenatal labs and studies: ABO, Rh: --/--/O POS (11/08 1217) Antibody: NEG (11/08 1217) Rubella:   RPR:    HBsAg:    HIV:    GBS:  2 hr GTT:   Genetic screening unknown Anatomy US normal per patient  Medical History:  Past Medical History:  Diagnosis Date  . Anxiety   . Memory loss 09/17/2013  . Sleep disturbance 09/17/2013    History reviewed. No pertinent surgical history.  OB History  Gravida Para Term Preterm AB Living  3 2 1 1  0 1  SAB TAB Ectopic Multiple Live Births  0       1    # Outcome Date GA Lbr Len/2nd Weight Sex Delivery Anes PTL Lv  3 Current           2 Term 04/22/07    M Vag-Spont   LIV  1 Preterm  [redacted]w[redacted]d       FD    Social History   Socioeconomic History  . Marital status: Single    Spouse name: Not on file  . Number of children: 1  . Years of education: College  . Highest education level: Not on file  Occupational History    Employer: NOT EMPLOYED    Comment: not employed  Social Needs  . Financial resource strain: Not on file  . Food insecurity:    Worry: Not on file    Inability: Not on file  . Transportation needs:    Medical: Not on file    Non-medical: Not on file  Tobacco Use  . Smoking status: Current Every Day  Smoker    Packs/day: 0.50    Years: 15.00    Pack years: 7.50    Types: Cigarettes  . Smokeless tobacco: Never Used  Substance and Sexual Activity  . Alcohol use: Not Currently  . Drug use: No  . Sexual activity: Not Currently    Birth control/protection: None  Lifestyle  . Physical activity:    Days per week: Not on file    Minutes per session: Not on file  . Stress: Not on file  Relationships  . Social connections:    Talks on phone: Not on file    Gets together: Not on file    Attends religious service: Not on file    Active member of club or organization: Not on file    Attends meetings of clubs or organizations: Not on file    Relationship status: Not on file  Other Topics Concern  . Not on file  Social History Narrative   Patient lives at home with son.   Caffeine use; 1-3 cups daily,is right handed    Family History  Problem Relation Age of Onset  . Healthy Mother   . Healthy Father   .  Bipolar disorder Maternal Grandmother   . Seizures Neg Hx   . Parkinsonism Neg Hx   . Multiple sclerosis Neg Hx   . Dementia Neg Hx     Medications Prior to Admission  Medication Sig Dispense Refill Last Dose  . hydrOXYzine (ATARAX/VISTARIL) 25 MG tablet Take 25 mg by mouth as needed for anxiety.   Past Week at Unknown time  . nicotine (NICODERM CQ - DOSED IN MG/24 HOURS) 14 mg/24hr patch Place 1 patch (14 mg total) onto the skin daily. 28 patch 0 02/07/2018 at Unknown time  . Prenatal Vit-Fe Fumarate-FA (MULTIVITAMIN-PRENATAL) 27-0.8 MG TABS tablet Take 1 tablet by mouth daily at 12 noon.   02/07/2018 at Unknown time    No Known Allergies  Review of Systems: Negative except for what is mentioned in HPI.  Physical Exam: BP 97/70   Pulse 79   Temp (!) 97.5 F (36.4 C) (Oral)   Resp 16   Wt 53 kg   LMP 01/01/2017 (Approximate)   SpO2 100%   BMI 21.35 kg/m  CONSTITUTIONAL: Well-developed, well-nourished female in no acute distress.  HENT:  Normocephalic, atraumatic,  External right and left ear normal. Oropharynx is clear and moist EYES: Conjunctivae and EOM are normal. Pupils are equal, round, and reactive to light. No scleral icterus.  NECK: Normal range of motion, supple, no masses SKIN: Skin is warm and dry. No rash noted. Not diaphoretic. No erythema. No pallor. NEUROLOGIC: Alert and oriented to person, place, and time. Normal reflexes, muscle tone coordination. No cranial nerve deficit noted. PSYCHIATRIC: Normal mood and affect. Normal behavior. Normal judgment and thought content. CARDIOVASCULAR: Normal heart rate noted, regular rhythm RESPIRATORY: Effort and breath sounds normal, no problems with respiration noted ABDOMEN: Soft, nontender, nondistended, gravid. MUSCULOSKELETAL: Normal range of motion. No edema and no tenderness. 2+ distal pulses.  Cervical Exam: Dilatation 2cm   Effacement 60%   Station 0   Presentation: cephalic FHT:  Baseline rate 150 bpm   Variability moderate  Accelerations present   Decelerations none Contractions: irritable   Pertinent Labs/Studies:   Results for orders placed or performed during the hospital encounter of 02/08/18 (from the past 24 hour(s))  Urinalysis, Routine w reflex microscopic     Status: Abnormal   Collection Time: 02/08/18 11:25 AM  Result Value Ref Range   Color, Urine YELLOW YELLOW   APPearance HAZY (A) CLEAR   Specific Gravity, Urine 1.020 1.005 - 1.030   pH 6.0 5.0 - 8.0   Glucose, UA NEGATIVE NEGATIVE mg/dL   Hgb urine dipstick NEGATIVE NEGATIVE   Bilirubin Urine NEGATIVE NEGATIVE   Ketones, ur 80 (A) NEGATIVE mg/dL   Protein, ur 782 (A) NEGATIVE mg/dL   Nitrite NEGATIVE NEGATIVE   Leukocytes, UA TRACE (A) NEGATIVE   RBC / HPF 6-10 0 - 5 RBC/hpf   WBC, UA 11-20 0 - 5 WBC/hpf   Bacteria, UA RARE (A) NONE SEEN   Squamous Epithelial / LPF 0-5 0 - 5   Mucus PRESENT   Amnisure rupture of membrane (rom)not at [facility]     Status: None   Collection Time: 02/08/18 11:48 AM  Result Value Ref  Range   Amnisure ROM POSITIVE   Type and screen [facility] [place]     Status: None   Collection Time: 02/08/18 12:17 PM  Result Value Ref Range   ABO/RH(D) O POS    Antibody Screen NEG    Sample Expiration      02/11/2018 Performed at [employer] [facility], [address], [place], [place] [address]     Assessment : Deborah Park is a 32 y.o. G3P1101 at [redacted]w[redacted]d being admitted for PPROM today. She is 2-3 cm/60/0 but not feeling contractions. H/o shortened cervix, not on [name]. States she had similar situation with last pregnancy where her water broke and she was in labor but did not feel contractions. Reviewed recommendation for BTMZ, MgSO4, patient agreeable. Will keep on continuous monitoring for now, fetal tracing reactive. For NICU consult.   Patient had care until 12/2017 with [facility], states she did not want to continue care there and has been trying to establish with [facility], however has not had an appointment there yet. States she has not had any issues since her last appointment until today.  Plan:  PPROM - latency abx - monitor s/s infection  FWB - cont monitoring for now - NICU consult (NICU aware) - MgSO4 x 24 hrs - BTMZ rescue course 11/8-9 - MFM ultrasound ordered  Routine care - obtain records from [facility] - 2hr GTT - 3rd trim blood work  Anxiety - vistaril prn  Baldemar Lenis, M.D. [facility]  02/08/2018, 2:05 PM

## 2018-02-08 NOTE — MAU Note (Signed)
Urine sent to lab 

## 2018-02-09 DIAGNOSIS — Z3A31 31 weeks gestation of pregnancy: Secondary | ICD-10-CM

## 2018-02-09 DIAGNOSIS — O42913 Preterm premature rupture of membranes, unspecified as to length of time between rupture and onset of labor, third trimester: Principal | ICD-10-CM

## 2018-02-09 LAB — RPR: RPR Ser Ql: NONREACTIVE

## 2018-02-09 LAB — HIV ANTIBODY (ROUTINE TESTING W REFLEX): HIV Screen 4th Generation wRfx: NONREACTIVE

## 2018-02-09 LAB — RAPID URINE DRUG SCREEN, HOSP PERFORMED
Amphetamines: POSITIVE — AB
Barbiturates: NOT DETECTED
Benzodiazepines: NOT DETECTED
COCAINE: POSITIVE — AB
OPIATES: NOT DETECTED
TETRAHYDROCANNABINOL: NOT DETECTED

## 2018-02-09 MED ORDER — NICOTINE 14 MG/24HR TD PT24
14.0000 mg | MEDICATED_PATCH | Freq: Every day | TRANSDERMAL | Status: DC
  Administered 2018-02-09 – 2018-02-13 (×5): 14 mg via TRANSDERMAL
  Filled 2018-02-09 (×6): qty 1

## 2018-02-09 MED ORDER — DIBUCAINE 1 % RE OINT
TOPICAL_OINTMENT | RECTAL | Status: DC | PRN
  Administered 2018-02-09: 21:00:00 via RECTAL
  Filled 2018-02-09: qty 28

## 2018-02-09 NOTE — Progress Notes (Signed)
Patient arguing with visitor in room for the second time tonight after patient told visitor to leave the first time. It was explained to patient that security will be called if the disturbance occurred again. Visitor was asked to leave the room by the nurse to speak to the patient privately. Patient expressed no physical, mental, or verbal abuse at this time. Patient did not wish to have visitor's name given to security to be banned at this time.

## 2018-02-09 NOTE — Progress Notes (Signed)
Patient ID: Deborah Park, female   DOB: 04-03-1986, 32 y.o.   MRN: [id number] FACULTY PRACTICE ANTEPARTUM(COMPREHENSIVE) NOTE  Deborah Park is a 32 y.o. G3P1101 at [redacted]w[redacted]d  who is admitted for PROM.    Fetal presentation is cephalic. Length of Stay:  1  Days  Date of admission:02/08/2018  Subjective: Patient reports feeling well  Patient reports the fetal movement as active. Patient reports uterine contraction  activity as none. Patient reports  vaginal bleeding as none. Patient describes fluid per vagina as Clear.  Vitals:  Blood pressure 93/66, pulse 76, temperature 97.9 F (36.6 C), temperature source Oral, resp. rate 16, height 5' (1.524 m), weight 53.5 kg, last menstrual period 01/01/2017, SpO2 99 %. Vitals:   02/09/18 0300 02/09/18 0415 02/09/18 0430 02/09/18 0734  BP:   99/63 93/66  Pulse:   75 76  Resp: 16 16 16 16   Temp:   98.2 F (36.8 C) 97.9 F (36.6 C)  TempSrc:   Oral Oral  SpO2:   97% 99%  Weight:      Height:       Physical Examination: GENERAL: Well-developed, well-nourished female in no acute distress.  LUNGS: Clear to auscultation bilaterally.  HEART: Regular rate and rhythm. ABDOMEN: Soft, nontender, gravid PELVIC: Not indicated EXTREMITIES: No cyanosis, clubbing, or edema, 2+ distal pulses.   Fetal Monitoring:  Baseline: 130 bpm, Variability: Good {> 6 bpm), Accelerations: Reactive and Decelerations: Absent   reactive Toco: irregular  Labs:  Results for orders placed or performed during the hospital encounter of 02/08/18 (from the past 24 hour(s))  Urinalysis, Routine w reflex microscopic   Collection Time: 02/08/18 11:25 AM  Result Value Ref Range   Color, Urine YELLOW YELLOW   APPearance HAZY (A) CLEAR   Specific Gravity, Urine 1.020 1.005 - 1.030   pH 6.0 5.0 - 8.0   Glucose, UA NEGATIVE NEGATIVE mg/dL   Hgb urine dipstick NEGATIVE NEGATIVE   Bilirubin Urine NEGATIVE NEGATIVE   Ketones, ur 80 (A) NEGATIVE mg/dL   Protein, ur 213 (A)  NEGATIVE mg/dL   Nitrite NEGATIVE NEGATIVE   Leukocytes, UA TRACE (A) NEGATIVE   RBC / HPF 6-10 0 - 5 RBC/hpf   WBC, UA 11-20 0 - 5 WBC/hpf   Bacteria, UA RARE (A) NONE SEEN   Squamous Epithelial / LPF 0-5 0 - 5   Mucus PRESENT   Urine rapid drug screen (hosp performed)not at [facility]   Collection Time: 02/08/18 11:28 AM  Result Value Ref Range   Opiates NONE DETECTED NONE DETECTED   Cocaine POSITIVE (A) NONE DETECTED   Benzodiazepines NONE DETECTED NONE DETECTED   Amphetamines POSITIVE (A) NONE DETECTED   Tetrahydrocannabinol NONE DETECTED NONE DETECTED   Barbiturates NONE DETECTED NONE DETECTED  Amnisure rupture of membrane (rom)not at [facility]   Collection Time: 02/08/18 11:48 AM  Result Value Ref Range   Amnisure ROM POSITIVE   Type and screen [facility] [place]   Collection Time: 02/08/18 12:17 PM  Result Value Ref Range   ABO/RH(D) O POS    Antibody Screen NEG    Sample Expiration      02/11/2018 Performed at [facility], [address], [place], [place] [address]   CBC on admission   Collection Time: 02/08/18  2:13 PM  Result Value Ref Range   WBC 12.7 (H) 4.0 - 10.5 K/uL   RBC 3.82 (L) 3.87 - 5.11 MIL/uL   Hemoglobin 12.9 12.0 - 15.0 g/dL   HCT 84.6 96.2 -  46.0 %   MCV 100.8 (H) 80.0 - 100.0 fL   MCH 33.8 26.0 - 34.0 pg   MCHC 33.5 30.0 - 36.0 g/dL   RDW 16.1 09.6 - 04.5 %   Platelets 269 150 - 400 K/uL   nRBC 0.0 0.0 - 0.2 %  HIV Antibody (routine testing w rflx)   Collection Time: 02/08/18  2:13 PM  Result Value Ref Range   HIV Screen 4th Generation wRfx Non Reactive Non Reactive  RPR   Collection Time: 02/08/18  2:13 PM  Result Value Ref Range   RPR Ser Ql Non Reactive Non Reactive  Hemoglobin A1c   Collection Time: 02/08/18  2:18 PM  Result Value Ref Range   Hgb A1c MFr Bld 4.6 (L) 4.8 - 5.6 %   Mean Plasma Glucose 85.32 mg/dL    Imaging Studies:      Medications:  Scheduled . [START ON 02/10/2018] amoxicillin  500 mg Oral Q8H  .  betamethasone acetate-betamethasone sodium phosphate  12 mg Intramuscular Q24H  . docusate sodium  100 mg Oral Daily  . [START ON 02/10/2018] erythromycin  250 mg Oral Q6H  . nicotine  14 mg Transdermal Daily  . prenatal multivitamin  1 tablet Oral Q1200   I have reviewed the patient's current medications.  ASSESSMENT: [id number] [redacted]w[redacted]d Estimated Date of Delivery: 04/11/18  Patient Active Problem List   Diagnosis Date Noted  . Preterm premature rupture of membranes (PPROM) with unknown onset of labor 02/08/2018  . Premature rupture of membranes 02/08/2018  . Short cervix 12/14/2017  . Short cervix during pregnancy in second trimester 12/13/2017  . Memory loss 09/17/2013  . Sleep disturbance 09/17/2013    PLAN: - Patient to receive second dose of BMZ today - Patient to complete magnesium sulfate today - Continue latency antibiotics - Nicotine patch ordered  - Continue monitoring for si/sx of preeclampsia  Masako Overall 02/09/2018,7:39 AM

## 2018-02-09 NOTE — Progress Notes (Signed)
Pt doesn't want to go on monitor now will go on later per pt.

## 2018-02-10 LAB — CULTURE, BETA STREP (GROUP B ONLY)

## 2018-02-10 MED ORDER — FAMOTIDINE 20 MG PO TABS
20.0000 mg | ORAL_TABLET | Freq: Every day | ORAL | Status: DC
  Administered 2018-02-10 – 2018-02-13 (×4): 20 mg via ORAL
  Filled 2018-02-10 (×4): qty 1

## 2018-02-10 MED ORDER — PANTOPRAZOLE SODIUM 40 MG PO TBEC
40.0000 mg | DELAYED_RELEASE_TABLET | Freq: Every day | ORAL | Status: DC
  Administered 2018-02-10 – 2018-02-13 (×4): 40 mg via ORAL
  Filled 2018-02-10 (×4): qty 1

## 2018-02-10 NOTE — Progress Notes (Signed)
Patient ID: Deborah Park, female   DOB: 06/17/1985, 32 y.o.   MRN: [id number] FACULTY PRACTICE ANTEPARTUM(COMPREHENSIVE) NOTE  Deborah Park is a 32 y.o. G3P1101 at [redacted]w[redacted]d by best clinical estimate who is admitted for PROM.   Fetal presentation is cephalic. Length of Stay:  2  Days  Subjective: Feels well Patient reports the fetal movement as active. Patient reports uterine contraction  activity as none. Patient reports  vaginal bleeding as none. Patient describes fluid per vagina as Clear.  Vitals:  Blood pressure (!) 87/54, pulse 90, temperature 99.6 F (37.6 C), temperature source Oral, resp. rate 16, height 5' (1.524 m), weight 53.5 kg, last menstrual period 01/01/2017, SpO2 97 %. Physical Examination:  General appearance - alert, well appearing, and in no distress Chest - normal effort Abdomen - gravid, non-tender Fundal Height:  size equals dates and non-tender Extremities: Homans sign is negative, no sign of DVT  Membranes:ruptured, clear fluid  Fetal Monitoring:  Baseline: 135 bpm, Variability: Good {> 6 bpm), Accelerations: Reactive and Decelerations: Absent  Labs:  No results found for this or any previous visit (from the past 24 hour(s)).  Medications:  Scheduled . amoxicillin  500 mg Oral Q8H  . docusate sodium  100 mg Oral Daily  . erythromycin  250 mg Oral Q6H  . nicotine  14 mg Transdermal Daily  . prenatal multivitamin  1 tablet Oral Q1200   I have reviewed the patient's current medications.  ASSESSMENT: Active Problems:   Preterm premature rupture of membranes (PPROM) with unknown onset of labor   Premature rupture of membranes   PLAN: S/p BMZ Continue latency abx Delivery with s/sx's of infection  Reva Bores, MD 02/10/2018,10:08 AM

## 2018-02-10 NOTE — Progress Notes (Signed)
3rd attempt made for fetal monitoring this morning. Patient experiencing acid reflux and stated she didn't want to go on the monitor until she felt better. Patient medicated for acid reflux.

## 2018-02-11 DIAGNOSIS — O42913 Preterm premature rupture of membranes, unspecified as to length of time between rupture and onset of labor, third trimester: Secondary | ICD-10-CM

## 2018-02-11 DIAGNOSIS — Z3A31 31 weeks gestation of pregnancy: Secondary | ICD-10-CM

## 2018-02-11 LAB — TYPE AND SCREEN
ABO/RH(D): O POS
Antibody Screen: NEGATIVE

## 2018-02-11 NOTE — Plan of Care (Signed)
  Problem: Clinical Measurements: Goal: Diagnostic test results will improve Outcome: Progressing Note:   Problem: Clinical Measurements: Goal: Diagnostic test results will improve Outcome: Not Progressing Note:  POC established to continue monitoring BP and adjust medication as needed per MD order.  Pt encouraged to report any change in status.

## 2018-02-11 NOTE — Progress Notes (Signed)
Patient ID: Deborah Park, female   DOB: [birth year], 32 y.o.   MRN: [id number] FACULTY PRACTICE ANTEPARTUM(COMPREHENSIVE) NOTE  Deborah Park is a 32 y.o. G3P1101 at [redacted]w[redacted]d by best clinical estimate who is admitted for PROM.   Fetal presentation is cephalic. Length of Stay:  3  Days  Subjective:  Patient reports the fetal movement as active. Patient reports uterine contraction  activity as none. Patient reports  vaginal bleeding as none. Patient describes fluid per vagina as Clear.  Vitals:  Blood pressure 107/77, pulse 62, temperature 98.3 F (36.8 C), temperature source Oral, resp. rate 18, height 5' (1.524 m), weight 53.5 kg, last menstrual period 01/01/2017, SpO2 96 %. Physical Examination:  General appearance - alert, well appearing, and in no distress Heart - normal rate and regular rhythm Abdomen - soft, nontender, nondistended Fundal Height:  size equals dates Cervical Exam: Not evaluated .Extremities: extremities normal, atraumatic, no cyanosis or edema and Homans sign is negative, no sign of DVT  Membranes:ruptured  Fetal Monitoring:   Fetal Heart Rate A  Mode External filed at 02/11/2018 1101  Baseline Rate (A) 145 bpm filed at 02/10/2018 2255  Variability <5 BPM, 6-25 BPM filed at 02/11/2018 1101  Accelerations 15 x 15 filed at 02/11/2018 1101  Decelerations None filed at 02/11/2018 1101   Labs:  Results for orders placed or performed during the hospital encounter of 02/08/18 (from the past 24 hour(s))  Type and screen [facility] [place]   Collection Time: 02/11/18  8:28 AM  Result Value Ref Range   ABO/RH(D) O POS    Antibody Screen NEG    Sample Expiration      02/14/2018 Performed at [facility], [address] [place], [place] [address]     Imaging Studies:     Currently EPIC will not allow sonographic studies to automatically populate into notes.  In the meantime, copy and paste results into note or free text.  Medications:   Scheduled . amoxicillin  500 mg Oral Q8H  . docusate sodium  100 mg Oral Daily  . erythromycin  250 mg Oral Q6H  . famotidine  20 mg Oral Daily  . nicotine  14 mg Transdermal Daily  . pantoprazole  40 mg Oral Daily  . prenatal multivitamin  1 tablet Oral Q1200   I have reviewed the patient's current medications.  ASSESSMENT: Patient Active Problem List   Diagnosis Date Noted  . Preterm premature rupture of membranes (PPROM) with unknown onset of labor 02/08/2018  . Premature rupture of membranes 02/08/2018  . Short cervix 12/14/2017  . Short cervix during pregnancy in second trimester 12/13/2017  . Memory loss 09/17/2013  . Sleep disturbance 09/17/2013    PLAN: Hospitalization for PPROM up to 34 weeks if no PTL fetal indication or infection  Scheryl Darter 02/11/2018,1:00 PM

## 2018-02-12 MED ORDER — SALINE SPRAY 0.65 % NA SOLN
1.0000 | NASAL | Status: DC | PRN
  Administered 2018-02-12: 1 via NASAL
  Filled 2018-02-12: qty 44

## 2018-02-12 MED ORDER — MENTHOL 3 MG MT LOZG
1.0000 | LOZENGE | OROMUCOSAL | Status: DC | PRN
  Filled 2018-02-12: qty 9

## 2018-02-12 NOTE — Progress Notes (Signed)
Patient ID: Deborah Park, female   DOB: 1986-02-18, 32 y.o.   MRN: [id number] Patient ID: Deborah Park, female   DOB: 1986-02-18, 32 y.o.   MRN: [id number] FACULTY PRACTICE ANTEPARTUM(COMPREHENSIVE) NOTE  Deborah Park is a 32 y.o. G3P1101 at 2238w5d by best clinical estimate who is admitted for PROM.   Fetal presentation is cephalic. Length of Stay:  4  Days  Subjective:  Patient reports the fetal movement as active. Patient reports uterine contraction  activity as none. Patient reports  vaginal bleeding as none. Patient describes fluid per vagina as Clear.  Blood pressure 105/66, pulse 95, temperature 97.9 F (36.6 C), resp. rate 18, height 5' (1.524 m), weight 53.5 kg, last menstrual period 01/01/2017, SpO2 98 %.  Physical Examination:  General appearance - alert, well appearing, and in no distress Heart - normal rate and regular rhythm Abdomen - soft, nontender, nondistended Fundal Height:  size equals dates Cervical Exam: Not evaluated .Extremities: extremities normal, atraumatic, no cyanosis or edema and Homans sign is negative, no sign of DVT  Membranes:ruptured  Fetal Monitoring:   Fetal Heart Rate A  Mode External filed at 02/12/2018 0843  Baseline Rate (A) 145 bpm filed at 02/12/2018 0843  Variability <5 BPM, 6-25 BPM filed at 02/12/2018 0843  Accelerations 15 x 15 filed at 02/12/2018 0843  Decelerations Variable filed at 02/12/2018 [id number]  Multiple birth? N filed at 02/09/2018 2252     Labs:        Results for orders placed or performed during the hospital encounter of 02/08/18 (from the past 24 hour(s))  [facility] screen [facility] [place]   Collection Time: 02/11/18  8:28 AM  Result Value Ref Range   ABO/RH(D) O POS    Antibody Screen NEG    Sample Expiration      02/14/2018 Performed at [facility], [address] [place], [place] [id number]     Imaging Studies:     Currently EPIC will not allow sonographic  studies to automatically populate into notes.  In the meantime, copy and paste results into note or free text.  Medications:  Scheduled . amoxicillin  500 mg Oral Q8H  . docusate sodium  100 mg Oral Daily  . erythromycin  250 mg Oral Q6H  . famotidine  20 mg Oral Daily  . nicotine  14 mg Transdermal Daily  . pantoprazole  40 mg Oral Daily  . prenatal multivitamin  1 tablet Oral Q1200   I have reviewed the patient's current medications.  ASSESSMENT:     Patient Active Problem List   Diagnosis Date Noted  . Preterm premature rupture of membranes (PPROM) with unknown onset of labor 02/08/2018  . Premature rupture of membranes 02/08/2018  . Short cervix 12/14/2017  . Short cervix during pregnancy in second trimester 12/13/2017  . Memory loss 09/17/2013  . Sleep disturbance 09/17/2013    PLAN: Hospitalization for PPROM up to 34 weeks if no PTL fetal indication or infection  Adam PhenixArnold, James G, MD 02/12/2018 10:34 AM

## 2018-02-13 ENCOUNTER — Encounter: Payer: Medicaid Other | Admitting: Obstetrics & Gynecology

## 2018-02-13 DIAGNOSIS — Z3A32 32 weeks gestation of pregnancy: Secondary | ICD-10-CM

## 2018-02-13 NOTE — Progress Notes (Signed)
  Patient Deborah Park:Deborah Park:Deborah Park,femaleDOB:1985-05-16,32 y.o.GEX:[id number]:[id number] FACULTY PRACTICE ANTEPARTUM(COMPREHENSIVE) NOTE  Quaneisha A Lawingis a 32 y.o.G3P1101 at 4740w6d by best clinical estimatewho is admitted for PROM.  Fetal presentation iscephalic. Length of Stay:5Days  Subjective:  Patient reports the fetal movement asactive. Patient reports uterine contraction activity as none. Patient reports vaginal bleeding as none. Patient describes fluid per vagina asClear.  Today's Vitals   02/13/18 0454 02/13/18 0810 02/13/18 0815 02/13/18 0816  BP: 127/85   119/85  Pulse: 71   (!) 108  Resp: 16   18  Temp:    98.2 F (36.8 C)  TempSrc:    Oral  SpO2: 99%   99%  Weight:      Height:      PainSc:  0-No pain 0-No pain    Body mass index is 23.05 kg/m.   Physical Examination: General appearance -alert, well appearing, and in no distress Heart - normal rate and regular rhythm Abdomen - soft, nontender, nondistended Fundal Height:size equals dates Cervical Exam:Not evaluated .Extremities:extremities normal, atraumatic, no cyanosis or edema and Homans sign is negative, no sign of DVT  Membranes:ruptured  Fetal Monitoring:    Fetal Heart Rate A    Mode External filed at 02/13/2018 0840  Baseline Rate (A) 155 bpm filed at 02/13/2018 0840  Variability 6-25 BPM filed at 02/13/2018 0840  Accelerations 15 x 15 filed at 02/13/2018 0840  Decelerations None filed at 02/13/2018 0840  Multiple birth? N filed at 02/09/2018 2252  Fetal Heart Rate Fetus B  Fetal Heart Rate Fetus C     Labs:       Results for orders placed or performed during the hospital encounter of 02/08/18 (from the past 24 hour(s))  Type and screen [facility]   Collection Time: 02/11/18 8:28 AM  Result Value Ref Range   ABO/RH(D) O POS    Antibody Screen NEG    Sample Expiration      02/14/2018 Performed at [facility], [address] [place], [place] [id number]     Imaging Studies:  Currently EPIC will not allow sonographic studies to automatically populate into notes. In the meantime, copy and paste results into note or free text.  Medications: Scheduled . amoxicillin 500 mg Oral Q8H  . docusate sodium 100 mg Oral Daily  . erythromycin 250 mg Oral Q6H  . famotidine 20 mg Oral Daily  . nicotine 14 mg Transdermal Daily  . pantoprazole 40 mg Oral Daily  . prenatal multivitamin 1 tablet Oral Q1200   I have reviewed the patient's current medications.  ASSESSMENT:     Patient Active Problem List   Diagnosis Date Noted  . Preterm premature rupture of membranes (PPROM) with unknown onset of labor 02/08/2018  . Premature rupture of membranes 02/08/2018  . Short cervix 12/14/2017  . Short cervix during pregnancy in second trimester 12/13/2017  . Memory loss 09/17/2013  . Sleep disturbance 09/17/2013    PLAN: Hospitalization for PPROM up to 34 weeks if no PTL fetal indication or infection  Adam PhenixArnold, James G, MD 02/13/2018 3:19 PM

## 2018-02-13 NOTE — Progress Notes (Signed)
Pt has not been on the unit this shift. RN and NT have checked hourly and pt is still off unit. No personal belongings are found in the room. Called security and house coverage to inform them.  Arva ChafeMeghan E Jayton Popelka, RN

## 2018-02-14 ENCOUNTER — Inpatient Hospital Stay (HOSPITAL_COMMUNITY)
Admission: AD | Admit: 2018-02-14 | Discharge: 2018-02-15 | DRG: 807 | Disposition: A | Discharge status: Home / Self Care | Payer: Medicaid Other | Attending: Obstetrics and Gynecology | Admitting: Obstetrics and Gynecology

## 2018-02-14 ENCOUNTER — Encounter (HOSPITAL_COMMUNITY): Payer: Self-pay | Admitting: *Deleted

## 2018-02-14 DIAGNOSIS — O99334 Smoking (tobacco) complicating childbirth: Secondary | ICD-10-CM | POA: Diagnosis present

## 2018-02-14 DIAGNOSIS — O99324 Drug use complicating childbirth: Secondary | ICD-10-CM

## 2018-02-14 DIAGNOSIS — Z3A31 31 weeks gestation of pregnancy: Secondary | ICD-10-CM

## 2018-02-14 DIAGNOSIS — O42913 Preterm premature rupture of membranes, unspecified as to length of time between rupture and onset of labor, third trimester: Secondary | ICD-10-CM | POA: Diagnosis not present

## 2018-02-14 DIAGNOSIS — Z3A32 32 weeks gestation of pregnancy: Secondary | ICD-10-CM

## 2018-02-14 DIAGNOSIS — F17210 Nicotine dependence, cigarettes, uncomplicated: Secondary | ICD-10-CM | POA: Diagnosis present

## 2018-02-14 DIAGNOSIS — F1910 Other psychoactive substance abuse, uncomplicated: Secondary | ICD-10-CM

## 2018-02-14 DIAGNOSIS — O42013 Preterm premature rupture of membranes, onset of labor within 24 hours of rupture, third trimester: Secondary | ICD-10-CM

## 2018-02-14 DIAGNOSIS — O0930 Supervision of pregnancy with insufficient antenatal care, unspecified trimester: Secondary | ICD-10-CM

## 2018-02-14 DIAGNOSIS — O479 False labor, unspecified: Secondary | ICD-10-CM | POA: Diagnosis present

## 2018-02-14 DIAGNOSIS — O4700 False labor before 37 completed weeks of gestation, unspecified trimester: Secondary | ICD-10-CM | POA: Diagnosis present

## 2018-02-14 HISTORY — DX: Arnold-Chiari syndrome with spina bifida: Q07.01

## 2018-02-14 LAB — COMPREHENSIVE METABOLIC PANEL
ALBUMIN: 2.7 g/dL — AB (ref 3.5–5.0)
ALT: 66 U/L — ABNORMAL HIGH (ref 0–44)
ANION GAP: 15 (ref 5–15)
AST: 61 U/L — ABNORMAL HIGH (ref 15–41)
Alkaline Phosphatase: 131 U/L — ABNORMAL HIGH (ref 38–126)
BUN: 16 mg/dL (ref 6–20)
CHLORIDE: 96 mmol/L — AB (ref 98–111)
CO2: 20 mmol/L — AB (ref 22–32)
Calcium: 8.9 mg/dL (ref 8.9–10.3)
Creatinine, Ser: 0.72 mg/dL (ref 0.44–1.00)
GFR calc Af Amer: >60 mL/min (ref >60)
GFR calc non Af Amer: >60 mL/min (ref >60)
GLUCOSE: 99 mg/dL (ref 70–99)
POTASSIUM: 3.3 mmol/L — AB (ref 3.5–5.1)
SODIUM: 131 mmol/L — AB (ref 135–145)
Total Bilirubin: 0.8 mg/dL (ref 0.3–1.2)
Total Protein: 6.5 g/dL (ref 6.5–8.1)

## 2018-02-14 LAB — URINALYSIS, ROUTINE W REFLEX MICROSCOPIC
BILIRUBIN URINE: NEGATIVE
Bacteria, UA: NONE SEEN
GLUCOSE, UA: NEGATIVE mg/dL
KETONES UR: 80 mg/dL — AB
LEUKOCYTES UA: NEGATIVE
NITRITE: NEGATIVE
PROTEIN: 30 mg/dL — AB
Specific Gravity, Urine: 1.014 (ref 1.005–1.030)
pH: 7 (ref 5.0–8.0)

## 2018-02-14 LAB — TYPE AND SCREEN
ABO/RH(D): O POS
Antibody Screen: NEGATIVE

## 2018-02-14 LAB — RAPID URINE DRUG SCREEN, HOSP PERFORMED
Amphetamines: POSITIVE — AB
BARBITURATES: NOT DETECTED
Benzodiazepines: NOT DETECTED
Cocaine: NOT DETECTED
Opiates: NOT DETECTED
Tetrahydrocannabinol: NOT DETECTED

## 2018-02-14 LAB — CBC
HCT: 40 % (ref 36.0–46.0)
Hemoglobin: 13.3 g/dL (ref 12.0–15.0)
MCH: 32.9 pg (ref 26.0–34.0)
MCHC: 33.3 g/dL (ref 30.0–36.0)
MCV: 99 fL (ref 80.0–100.0)
PLATELETS: 225 10*3/uL (ref 150–400)
RBC: 4.04 MIL/uL (ref 3.87–5.11)
RDW: 12.9 % (ref 11.5–15.5)
WBC: 19.5 10*3/uL — AB (ref 4.0–10.5)
nRBC: 0 % (ref 0.0–0.2)

## 2018-02-14 MED ORDER — DIPHENHYDRAMINE HCL 25 MG PO CAPS: 25.0000 mg | ORAL_CAPSULE | Freq: Four times a day (QID) | ORAL | Status: DC | PRN

## 2018-02-14 MED ORDER — DIBUCAINE 1 % RE OINT
1.0000 "application " | TOPICAL_OINTMENT | RECTAL | Status: DC | PRN
  Administered 2018-02-15: 1 via RECTAL
  Filled 2018-02-14: qty 28

## 2018-02-14 MED ORDER — ONDANSETRON HCL 4 MG PO TABS: 4.0000 mg | ORAL_TABLET | ORAL | Status: DC | PRN

## 2018-02-14 MED ORDER — ACETAMINOPHEN 325 MG PO TABS: 650.0000 mg | ORAL_TABLET | ORAL | Status: DC | PRN

## 2018-02-14 MED ORDER — SIMETHICONE 80 MG PO CHEW: 80.0000 mg | CHEWABLE_TABLET | ORAL | Status: DC | PRN

## 2018-02-14 MED ORDER — LACTATED RINGERS IV SOLN
INTRAVENOUS | Status: DC
  Administered 2018-02-14: 21:00:00 via INTRAVENOUS

## 2018-02-14 MED ORDER — PRENATAL MULTIVITAMIN CH
1.0000 | ORAL_TABLET | Freq: Every day | ORAL | Status: DC
  Administered 2018-02-15: 1 via ORAL
  Filled 2018-02-14: qty 1

## 2018-02-14 MED ORDER — ALUM & MAG HYDROXIDE-SIMETH 200-200-20 MG/5ML PO SUSP: 30.0000 mL | ORAL | Status: DC | PRN

## 2018-02-14 MED ORDER — SENNOSIDES-DOCUSATE SODIUM 8.6-50 MG PO TABS: 2.0000 | ORAL_TABLET | Freq: Every evening | ORAL | Status: DC | PRN

## 2018-02-14 MED ORDER — OXYTOCIN 10 UNIT/ML IJ SOLN
INTRAMUSCULAR | Status: AC
  Administered 2018-02-14: 10 [IU]
  Filled 2018-02-14: qty 1

## 2018-02-14 MED ORDER — BENZOCAINE-MENTHOL 20-0.5 % EX AERO
1.0000 "application " | INHALATION_SPRAY | CUTANEOUS | Status: DC | PRN
  Administered 2018-02-15: 1 via TOPICAL
  Filled 2018-02-14: qty 56

## 2018-02-14 MED ORDER — OXYTOCIN 40 UNITS IN LACTATED RINGERS INFUSION - SIMPLE MED
1.0000 m[IU]/min | Freq: Once | INTRAVENOUS | Status: AC
  Administered 2018-02-14: 22:00:00 via INTRAVENOUS

## 2018-02-14 MED ORDER — COCONUT OIL OIL
1.0000 "application " | TOPICAL_OIL | Status: DC | PRN
  Filled 2018-02-14: qty 120

## 2018-02-14 MED ORDER — TETANUS-DIPHTH-ACELL PERTUSSIS 5-2.5-18.5 LF-MCG/0.5 IM SUSP
0.5000 mL | Freq: Once | INTRAMUSCULAR | Status: AC
  Administered 2018-02-15: 0.5 mL via INTRAMUSCULAR
  Filled 2018-02-14: qty 0.5

## 2018-02-14 MED ORDER — WITCH HAZEL-GLYCERIN EX PADS
1.0000 "application " | MEDICATED_PAD | CUTANEOUS | Status: DC | PRN
  Administered 2018-02-15: 1 via TOPICAL

## 2018-02-14 MED ORDER — ONDANSETRON HCL 4 MG/2ML IJ SOLN: 4.0000 mg | INTRAMUSCULAR | Status: DC | PRN

## 2018-02-14 MED ORDER — IBUPROFEN 600 MG PO TABS
600.0000 mg | ORAL_TABLET | Freq: Four times a day (QID) | ORAL | Status: DC
  Administered 2018-02-14 – 2018-02-15 (×3): 600 mg via ORAL
  Filled 2018-02-14 (×3): qty 1

## 2018-02-14 MED ORDER — OXYTOCIN 40 UNITS IN LACTATED RINGERS INFUSION - SIMPLE MED
INTRAVENOUS | Status: AC
  Filled 2018-02-14: qty 1000

## 2018-02-14 NOTE — MAU Note (Signed)
Pt presents to MAU c/o ctx "every few seconds" pt states she was admitted due to LOF that started last Friday but left AMA. +FM.

## 2018-02-14 NOTE — H&P (Addendum)
Obstetrics Admission History & Physical  02/14/2018 - 9:37 PM Primary OBGYN: None  Chief Complaint: preterm contractions in setting of known PPROM on 11/8 @ 31/1 weeks History of Present Illness  32 y.o. [id number] @ [redacted]w[redacted]d with above CC. Patient was inpatient from 11/8 untiil last night when she left AMA. Pt represents for worsening contractions. When I was called to see patient fetus was tachy to 180s-190s and having decels to 80s.   Review of Systems: as noted in the History of Present Illness.  PMHx:  Past Medical History:  Diagnosis Date  . Anxiety   . Memory loss 09/17/2013  . Sleep disturbance 09/17/2013   PSHx:  Past Surgical History:  Procedure Laterality Date  . KIDNEY SURGERY     Medications: None   Allergies: has No Known Allergies. OBHx:  OB History  Gravida Para Term Preterm AB Living  3 2 1 1  0 1  SAB TAB Ectopic Multiple Live Births  0       1    # Outcome Date GA Lbr Len/2nd Weight Sex Delivery Anes PTL Lv  3 Current           2 Term 04/22/07    M Vag-Spont   LIV  1 Preterm  [redacted]w[redacted]d       FD              FHx:  Family History  Problem Relation Age of Onset  . Healthy Mother   . Healthy Father   . Bipolar disorder Maternal Grandmother   . Seizures Neg Hx   . Parkinsonism Neg Hx   . Multiple sclerosis Neg Hx   . Dementia Neg Hx    Soc Hx:  Social History   Socioeconomic History  . Marital status: Single    Spouse name: Not on file  . Number of children: 1  . Years of education: College  . Highest education level: Not on file  Occupational History    Employer: NOT EMPLOYED    Comment: not employed  Social Needs  . Financial resource strain: Not on file  . Food insecurity:    Worry: Not on file    Inability: Not on file  . Transportation needs:    Medical: Not on file    Non-medical: Not on file  Tobacco Use  . Smoking status: Current Every Day Smoker    Packs/day: 0.50    Years: 15.00    Pack years: 7.50    Types: Cigarettes  .  Smokeless tobacco: Never Used  Substance and Sexual Activity  . Alcohol use: Not Currently  . Drug use: Yes    Types: Amphetamines, Cocaine    Comment: last report 08 Feb 2018. pt reports none in preg  . Sexual activity: Not Currently    Birth control/protection: None  Lifestyle  . Physical activity:    Days per week: Not on file    Minutes per session: Not on file  . Stress: Not on file  Relationships  . Social connections:    Talks on phone: Not on file    Gets together: Not on file    Attends religious service: Not on file    Active member of club or organization: Not on file    Attends meetings of clubs or organizations: Not on file    Relationship status: Not on file  . Intimate partner violence:    Fear of current or ex partner: Not on file    Emotionally abused: Not on  file    Physically abused: Not on file    Forced sexual activity: Not on file  Other Topics Concern  . Not on file  Social History Narrative   Patient lives at home with son.   Caffeine use; 1-3 cups daily,is right handed    Objective    Current Vital Signs 24h Vital Sign Ranges  T 97.6 F (36.4 C) Temp  Avg: 97.6 F (36.4 C)  Min: 97.6 F (36.4 C)  Max: 97.6 F (36.4 C)  BP 134/87 BP  Min: 134/87  Max: 134/87  HR (!) 102 Pulse  Avg: 102  Min: 102  Max: 102  RR 18 Resp  Avg: 18  Min: 18  Max: 18  SaO2     No data recorded       24 Hour I/O Current Shift I/O  Time Ins Outs No intake/output data recorded. No intake/output data recorded.   Category II due to tachycardia and decels, q8430m UCs  General: Well nourished, well developed female in no acute distress.  Skin:  Warm and dry.  Respiratory:  Normal respiratory effort Abdomen: gravid nttp Neuro/Psych:  Normal mood and affect.  Bedside u/s: cephalic, fetus very low-->+3 to +4, DOA on SVE  Labs  None  Radiology See above  Assessment & Plan  Preterm labor-->delivered patient in triage with NICU present. See delivery note.  Patient  denies any drug use since leaving hospital. Will get SW consult PP. Basic admit labs, UDS. No IV needed given minimal EBL.   Dr. Henderson CloudHorvath to let us know what her HepB status is.   Cornelia Copaharlie Vondra Aldredge, Jr. MD Attending Center for [facility](Faculty Practice)

## 2018-02-14 NOTE — MAU Note (Signed)
Pt arrived at MAU in labor. She had been on HROB for PPROM but left AMA. RN unable to initially get fetal heart tones, so called provider w/bedside U/S to room.  NP Jerrye BushyJ. Rausch found fetal tones and U/S was placed. FHR tachycardic in 170s NICU called/ Code Apgar.18 g. IV placed and fluid bolus started.Marland Kitchen. Pt checked and was complete. Dr Vergie LivingPickens called to room to assess for delivery. It was decided to deliver baby in MAU.  Dr Emeterio ReevePickens, J. Rausch NP and this RN present for delivery. Baby had spontaneous crying, cord clamped and baby handed off to NICU team.   Pt given IM pitocin, fluid bolus and bag of pitocin.  PT stable postpartum.Uterus was deviated to rt but, pt voided once in MAU and fundus now midline/firm/3 below U. Minimal bleeding . Pt voided and ate some crackers and juice. Had 600 mg Ibuprofen. Declined ice to perineum.   Pt transported to room 316 and report given to J. C. PenneyLisa RN.

## 2018-02-14 NOTE — MAU Provider Note (Signed)
History     CSN: [id number]  Arrival date and time: 02/14/18 2044   First Provider Initiated Contact with Patient 02/14/18 2107      Chief Complaint  Patient presents with  . Contractions   HPI   Ms.Deborah Park is a 32 y.o. female G48P1101 @ [redacted]w[redacted]d here with painful contractions. She was admitted to the hospital last Friday due to PPROM and left this morning because " I thought I would be ok". Says the contractions started shortly after she left, however she is now feeling them every few seconds. No bleeding. + fetal movement.   OB History    Gravida  3   Para  2   Term  1   Preterm  1   AB  0   Living  1     SAB  0   TAB      Ectopic      Multiple      Live Births  1           Past Medical History:  Diagnosis Date  . Anxiety   . Memory loss 09/17/2013  . Sleep disturbance 09/17/2013    Past Surgical History:  Procedure Laterality Date  . KIDNEY SURGERY      Family History  Problem Relation Age of Onset  . Healthy Mother   . Healthy Father   . Bipolar disorder Maternal Grandmother   . Seizures Neg Hx   . Parkinsonism Neg Hx   . Multiple sclerosis Neg Hx   . Dementia Neg Hx     Social History   Tobacco Use  . Smoking status: Current Every Day Smoker    Packs/day: 0.50    Years: 15.00    Pack years: 7.50    Types: Cigarettes  . Smokeless tobacco: Never Used  Substance Use Topics  . Alcohol use: Not Currently  . Drug use: Yes    Types: Amphetamines, Cocaine    Comment: last report 08 Feb 2018. pt reports none in preg    Allergies: No Known Allergies  Medications Prior to Admission  Medication Sig Dispense Refill Last Dose  . amoxicillin (AMOXIL) 500 MG tablet Take 500 mg by mouth 2 (two) times daily.   02/14/2018 at Unknown time  . hydrOXYzine (ATARAX/VISTARIL) 25 MG tablet Take 25 mg by mouth as needed for anxiety.   Past Week at Unknown time  . nicotine (NICODERM CQ - DOSED IN MG/24 HOURS) 14 mg/24hr patch Place 1 patch (14  mg total) onto the skin daily. 28 patch 0 02/07/2018 at Unknown time  . Prenatal Vit-Fe Fumarate-FA (MULTIVITAMIN-PRENATAL) 27-0.8 MG TABS tablet Take 1 tablet by mouth daily at 12 noon.   02/07/2018 at Unknown time   No results found for this or any previous visit (from the past 48 hour(s)).  Review of Systems  Constitutional: Negative for fever.  Gastrointestinal: Positive for abdominal pain.   Physical Exam   Blood pressure 134/87, pulse (!) 102, temperature 97.6 F (36.4 C), resp. rate 18, last menstrual period 01/01/2017.  Physical Exam  Constitutional: She is oriented to person, place, and time. She appears distressed.  Neurological: She is alert and oriented to person, place, and time.  Skin: Skin is warm.  Psychiatric: Her behavior is normal.   MAU Course  Procedures  None  MDM  Unable to doppler fetal heart tones per RN  Fetal heart rate not seen on bedside US IV started Stat Beside [place] called, Fetal heart rate noted after  30-60 seconds. Fetal monitor placed showing FHR 190's-200's, no variability with decelerations down to 70's. Dr. Vergie LivingPickens called STAT to MAU. Notified NICU.   Assessment and Plan    Deborah Park, Harolyn RutherfordJennifer I, NP. 02/14/2018 9:52 PM

## 2018-02-15 LAB — RPR: RPR: NONREACTIVE

## 2018-02-15 LAB — HEPATITIS B SURFACE ANTIGEN: HEP B S AG: NEGATIVE

## 2018-02-15 MED ORDER — NICOTINE 21 MG/24HR TD PT24
21.0000 mg | MEDICATED_PATCH | Freq: Every day | TRANSDERMAL | Status: DC
  Administered 2018-02-15: 21 mg via TRANSDERMAL
  Filled 2018-02-15: qty 1

## 2018-02-15 MED ORDER — IBUPROFEN 600 MG PO TABS: 600.0000 mg | ORAL_TABLET | Freq: Four times a day (QID) | ORAL | 0 refills | Status: DC

## 2018-02-15 MED ORDER — NICOTINE 14 MG/24HR TD PT24
14.0000 mg | MEDICATED_PATCH | Freq: Every day | TRANSDERMAL | Status: DC
  Administered 2018-02-15: 14 mg via TRANSDERMAL
  Filled 2018-02-15 (×2): qty 1

## 2018-02-15 NOTE — Progress Notes (Signed)
CSW attempted to meet with MOB various times however, MOB was not available.  CSW looked for MOB in her room as well at the NICU bedside. CSW will attempt to complete an assessment with MOB at a later time when MOB is available.    [facility] infant's positive UDS for amphetamines.   At this time there are barriers to infant's discharge to MOB.  Blaine HamperAngel Boyd-Gilyard, MSW, LCSW Clinical Social Work [phone]

## 2018-02-15 NOTE — Discharge Instructions (Signed)
Vaginal Delivery, Care After °Refer to this sheet in the next few weeks. These instructions provide you with information about caring for yourself after vaginal delivery. Your health care provider may also give you more specific instructions. Your treatment has been planned according to current medical practices, but problems sometimes occur. Call your health care provider if you have any problems or questions. °What can I expect after the procedure? °After vaginal delivery, it is common to have: °· Some bleeding from your vagina. °· Soreness in your abdomen, your vagina, and the area of skin between your vaginal opening and your anus (perineum). °· Pelvic cramps. °· Fatigue. ° °Follow these instructions at home: °Medicines °· Take over-the-counter and prescription medicines only as told by your health care provider. °· If you were prescribed an antibiotic medicine, take it as told by your health care provider. Do not stop taking the antibiotic until it is finished. °Driving ° °· Do not drive or operate heavy machinery while taking prescription pain medicine. °· Do not drive for 24 hours if you received a sedative. °Lifestyle °· Do not drink alcohol. This is especially important if you are breastfeeding or taking medicine to relieve pain. °· Do not use tobacco products, including cigarettes, chewing tobacco, or e-cigarettes. If you need help quitting, ask your health care provider. °Eating and drinking °· Drink at least 8 eight-ounce glasses of water every day unless you are told not to by your health care provider. If you choose to breastfeed your baby, you may need to drink more water than this. °· Eat high-fiber foods every day. These foods may help prevent or relieve constipation. High-fiber foods include: °? Whole grain cereals and breads. °? Brown rice. °? Beans. °? Fresh fruits and vegetables. °Activity °· Return to your normal activities as told by your health care provider. Ask your health care provider  what activities are safe for you. °· Rest as much as possible. Try to rest or take a nap when your baby is sleeping. °· Do not lift anything that is heavier than your baby or 10 lb (4.5 kg) until your health care provider says that it is safe. °· Talk with your health care provider about when you can engage in sexual activity. This may depend on your: °? Risk of infection. °? Rate of healing. °? Comfort and desire to engage in sexual activity. °Vaginal Care °· If you have an episiotomy or a vaginal tear, check the area every day for signs of infection. Check for: °? More redness, swelling, or pain. °? More fluid or blood. °? Warmth. °? Pus or a bad smell. °· Do not use tampons or douches until your health care provider says this is safe. °· Watch for any blood clots that may pass from your vagina. These may look like clumps of dark red, brown, or black discharge. °General instructions °· Keep your perineum clean and dry as told by your health care provider. °· Wear loose, comfortable clothing. °· Wipe from front to back when you use the toilet. °· Ask your health care provider if you can shower or take a bath. If you had an episiotomy or a perineal tear during labor and delivery, your health care provider may tell you not to take baths for a certain length of time. °· Wear a bra that supports your breasts and fits you well. °· If possible, have someone help you with household activities and help care for your baby for at least a few days after   you leave the hospital. °· Keep all follow-up visits for you and your baby as told by your health care provider. This is important. °Contact a health care provider if: °· You have: °? Vaginal discharge that has a bad smell. °? Difficulty urinating. °? Pain when urinating. °? A sudden increase or decrease in the frequency of your bowel movements. °? More redness, swelling, or pain around your episiotomy or vaginal tear. °? More fluid or blood coming from your episiotomy or  vaginal tear. °? Pus or a bad smell coming from your episiotomy or vaginal tear. °? A fever. °? A rash. °? Little or no interest in activities you used to enjoy. °? Questions about caring for yourself or your baby. °· Your episiotomy or vaginal tear feels warm to the touch. °· Your episiotomy or vaginal tear is separating or does not appear to be healing. °· Your breasts are painful, hard, or turn red. °· You feel unusually sad or worried. °· You feel nauseous or you vomit. °· You pass large blood clots from your vagina. If you pass a blood clot from your vagina, save it to show to your health care provider. Do not flush blood clots down the toilet without having your health care provider look at them. °· You urinate more than usual. °· You are dizzy or light-headed. °· You have not breastfed at all and you have not had a menstrual period for 12 weeks after delivery. °· You have stopped breastfeeding and you have not had a menstrual period for 12 weeks after you stopped breastfeeding. °Get help right away if: °· You have: °? Pain that does not go away or does not get better with medicine. °? Chest pain. °? Difficulty breathing. °? Blurred vision or spots in your vision. °? Thoughts about hurting yourself or your baby. °· You develop pain in your abdomen or in one of your legs. °· You develop a severe headache. °· You faint. °· You bleed from your vagina so much that you fill two sanitary pads in one hour. °This information is not intended to replace advice given to you by your health care provider. Make sure you discuss any questions you have with your health care provider. °Document Released: 03/17/2000 Document Revised: 09/01/2015 Document Reviewed: 04/04/2015 °Elsevier Interactive Patient Education © 2018 [employer] Inc. ° °

## 2018-02-15 NOTE — Progress Notes (Signed)
CSW to unit to consult pt.  Pt not in room & CSW states pt is not in NICU.  Pt called on phone number provided in chart & message left for pt to return to unit.

## 2018-02-15 NOTE — Plan of Care (Signed)
Pts. Condition will continue to improve 

## 2018-02-15 NOTE — Progress Notes (Signed)
Pt arrived to unit at 1150 accompanied by support person.  Assessment completed & WNL.  Asked pt if she had remained in hospital & she states she never left, that she was walking around the campus with support person.  Pt states she was under the impression that she "didn't have to be locked up in the room" & that the aide informed her that she didn't need to be here except for 08, 12, & 16 for vital signs.  I explained to her that it is not common for pt's to leave the room & not be in NICU for so long & that I would prefer her to notify primary RN if she was leaving the unit.  Pt apologized for the miscommunication & states that she will notify primary RN if she leaves the unit again.

## 2018-02-15 NOTE — Progress Notes (Signed)
Pt returned phone call to unit at 1005 & informed secretary that she "told the aide that I was going downstairs" & "she told me I didn't need to be back until 12p".  Pt informed that RN wants her back on the unit now; states she will come to unit soon.

## 2018-02-15 NOTE — Lactation Note (Signed)
This note was copied from a baby's chart. Lactation Consultation Note  Patient Name: Boy Berdine DanceSamantha Tavano ZOXWR'UToday's Date: 02/15/2018   18 hour old 32 week and 0 days preterm infant in NICU.  Infant with positive urine screen for amphetamines.  Mom with positive drug screen for cocaine and amphetamines.  Mom wanting to pump her breastmilk and give to infant.Encouraged mom to follow up with neonatologist regarding this.   Mom reports she tried to breastfeed and pump with her last child but she did not last very long.  Mom reports did it for about 1 week.  Mom with rubbed/scraped places on areolas and reports areolas are painful .  Nipples intact.  Observed moms breast/nipples/and areolas.  Mom with small breasts and areolas. Observed mom pumping with 24mm flange.  Areolas are drawn down into flange at exact place she has the abrasions.  Gave 21 mm flanges .  Mom pumped for just a few minutes with those. Mom reports more comfort and areola is not getting sucked down into flange tunnel.  Gave coconut oil and encouraged mom to use with pumping.  Reviewed providing Breastmilk for your baby in the NICU booklet.  [facility]  Mom has Medela pump n style DEBP personal use pump loaned to her from a Agricultural consultantmaternal coordinator.  Reviewed single use pumps and FDA guidelines. Encouraged patient to take all pump parts including her tubes and to use those with the pump n style.  Showed her how to modify so they would fit. Gave and reviewed with mom breastfeeding resources list and breastfeeding consultaton services handouts.  Mom to call as needed.  Being d/c today.  Maternal Data    Feeding Feeding Type: Formula  LATCH Score                   Interventions    Lactation Tools Discussed/Used     Consult Status      Merla Sawka Michaelle CopasS Quincy Prisco 02/15/2018, 3:57 PM

## 2018-02-15 NOTE — Discharge Summary (Signed)
Antenatal Physician Discharge Summary  Patient ID: Deborah Park MRN: [id number] DOB/AGE: 1986-01-22 32 y.o.  Admit date: 02/08/2018 Discharge date: 02/15/2018  Admission Diagnoses:Active Problems:   Preterm premature rupture of membranes (PPROM) with unknown onset of labor   Premature rupture of membranes    Discharge Diagnoses: Same  Prenatal Procedures: NST, ultrasound and BMZ and Magnesium Sulfate x 12 hours  Consults: None  Hospital Course:  This is a 32 y.o. G2P0101 with IUP at [redacted]w[redacted]d admitted for PPROM. She was admitted with contractions, noted to have a cervical exam of 2.5 cm and ROM. She was initially started on magnesium sulfate for neuroprotection and also received betamethasone x 2 doses and latency antibiotics. On hospital day # 5, she left the hospital without informing anyone. She could not be found on the unit and therefore left against medical advice.  Discharge Exam: Temp:  [97.6 F (36.4 C)-98.6 F (37 C)] 98.3 F (36.8 C) (11/15 0753) Pulse Rate:  [90-126] 103 (11/15 0753) Resp:  [18-19] 19 (11/15 0753) BP: (78-138)/(63-95) 78/63 (11/15 0753) SpO2:  [98 %-100 %] 100 % (11/15 0753) Physical Examination: Not done, due to no one knowing she left   Significant Diagnostic Studies:  Results for orders placed or performed during the hospital encounter of 02/14/18 (from the past 168 hour(s))  CBC   Collection Time: 02/14/18  9:38 PM  Result Value Ref Range   WBC 19.5 (H) 4.0 - 10.5 K/uL   RBC 4.04 3.87 - 5.11 MIL/uL   Hemoglobin 13.3 12.0 - 15.0 g/dL   HCT 40.9 81.1 - 91.4 %   MCV 99.0 80.0 - 100.0 fL   MCH 32.9 26.0 - 34.0 pg   MCHC 33.3 30.0 - 36.0 g/dL   RDW 78.2 95.6 - 21.3 %   Platelets 225 150 - 400 K/uL   nRBC 0.0 0.0 - 0.2 %  Comprehensive metabolic panel   Collection Time: 02/14/18  9:38 PM  Result Value Ref Range   Sodium 131 (L) 135 - 145 mmol/L   Potassium 3.3 (L) 3.5 - 5.1 mmol/L   Chloride 96 (L) 98 - 111 mmol/L   CO2 20 (L) 22 -  32 mmol/L   Glucose, Bld 99 70 - 99 mg/dL   BUN 16 6 - 20 mg/dL   Creatinine, Ser 0.86 0.44 - 1.00 mg/dL   Calcium 8.9 8.9 - 57.8 mg/dL   Total Protein 6.5 6.5 - 8.1 g/dL   Albumin 2.7 (L) 3.5 - 5.0 g/dL   AST 61 (H) 15 - 41 U/L   ALT 66 (H) 0 - 44 U/L   Alkaline Phosphatase 131 (H) 38 - 126 U/L   Total Bilirubin 0.8 0.3 - 1.2 mg/dL   GFR calc non Af Amer >60 >60 mL/min   GFR calc Af Amer >60 >60 mL/min   Anion gap 15 5 - 15  Type and screen [facility] [place] [facility]   Collection Time: 02/14/18  9:38 PM  Result Value Ref Range   ABO/RH(D) O POS    Antibody Screen NEG    Sample Expiration      02/17/2018 Performed at [facility], [address] [place], [place] [address]   Hepatitis B surface antigen   Collection Time: 02/14/18  9:50 PM  Result Value Ref Range   Hepatitis B Surface Ag Negative Negative  Urine rapid drug screen (hosp performed)   Collection Time: 02/14/18 10:25 PM  Result Value Ref Range   Opiates NONE DETECTED NONE DETECTED  Cocaine NONE DETECTED NONE DETECTED   Benzodiazepines NONE DETECTED NONE DETECTED   Amphetamines POSITIVE (A) NONE DETECTED   Tetrahydrocannabinol NONE DETECTED NONE DETECTED   Barbiturates NONE DETECTED NONE DETECTED  Urinalysis, Routine w reflex microscopic   Collection Time: 02/14/18 10:25 PM  Result Value Ref Range   Color, Urine YELLOW YELLOW   APPearance CLEAR CLEAR   Specific Gravity, Urine 1.014 1.005 - 1.030   pH 7.0 5.0 - 8.0   Glucose, UA NEGATIVE NEGATIVE mg/dL   Hgb urine dipstick LARGE (A) NEGATIVE   Bilirubin Urine NEGATIVE NEGATIVE   Ketones, ur 80 (A) NEGATIVE mg/dL   Protein, ur 30 (A) NEGATIVE mg/dL   Nitrite NEGATIVE NEGATIVE   Leukocytes, UA NEGATIVE NEGATIVE   RBC / HPF >50 (H) 0 - 5 RBC/hpf   WBC, UA 6-10 0 - 5 WBC/hpf   Bacteria, UA NONE SEEN NONE SEEN   Mucus PRESENT   Results for orders placed or performed during the hospital encounter of 02/08/18 (from the past 168 hour(s))  Amnisure  rupture of membrane (rom)not at [facility]   Collection Time: 02/08/18 11:48 AM  Result Value Ref Range   Amnisure ROM POSITIVE   Type and screen [facility] [place]   Collection Time: 02/08/18 12:17 PM  Result Value Ref Range   ABO/RH(D) O POS    Antibody Screen NEG    Sample Expiration      02/11/2018 Performed at [facility], [address], [place], [place] [address]   CBC on admission   Collection Time: 02/08/18  2:13 PM  Result Value Ref Range   WBC 12.7 (H) 4.0 - 10.5 K/uL   RBC 3.82 (L) 3.87 - 5.11 MIL/uL   Hemoglobin 12.9 12.0 - 15.0 g/dL   HCT 60.4 54.0 - 98.1 %   MCV 100.8 (H) 80.0 - 100.0 fL   MCH 33.8 26.0 - 34.0 pg   MCHC 33.5 30.0 - 36.0 g/dL   RDW 19.1 47.8 - 29.5 %   Platelets 269 150 - 400 K/uL   nRBC 0.0 0.0 - 0.2 %  HIV Antibody (routine testing w rflx)   Collection Time: 02/08/18  2:13 PM  Result Value Ref Range   HIV Screen 4th Generation wRfx Non Reactive Non Reactive  RPR   Collection Time: 02/08/18  2:13 PM  Result Value Ref Range   RPR Ser Ql Non Reactive Non Reactive  Hemoglobin A1c   Collection Time: 02/08/18  2:18 PM  Result Value Ref Range   Hgb A1c MFr Bld 4.6 (L) 4.8 - 5.6 %   Mean Plasma Glucose 85.32 mg/dL  Culture, beta strep (group b only)   Collection Time: 02/08/18  5:30 PM  Result Value Ref Range   Specimen Description      VAGINAL/RECTAL Performed at [facility], [address], [place], [place] [address]    Special Requests      NONE Performed at [facility], [address], [place], [place] [address]    Culture      NO GROUP B STREP (S.AGALACTIAE) ISOLATED Performed at [facility], [address], [place], [place] [address]    Report Status 02/10/2018 FINAL   Type and screen [facility]   Collection Time: 02/11/18  8:28 AM  Result Value Ref Range   ABO/RH(D) O POS    Antibody Screen NEG    Sample Expiration      02/14/2018 Performed at [facility], [address], [place],  [place] [address]    [place] Mfm Fetal Bpp Wo Non Stress  Result Date: 02/09/2018 ----------------------------------------------------------------------  OBSTETRICS REPORT                       (Signed Final 02/09/2018 09:26 am) ---------------------------------------------------------------------- Patient Info  ID #:       [id number]                          D.O.B.:  1985/11/19 (32 yrs)  Name:       [name]               Visit Date: 02/08/2018 05:22 pm ---------------------------------------------------------------------- Performed By  Performed By:     Marcellina Millin          Ref. Address:     [address]                    [place]                                                             Rd  Attending:        Noralee Space MD        Secondary Phy.:   3rd Nursing- HR                                                             OB                                                             3rd Floor  Referred By:      Conan Bowens          Location:         [facility]                    MD ---------------------------------------------------------------------- Orders   #  Description                          Code         Ordered By   1  [place] MFM OB COMP + 14 WK               76805.01     KELLY DAVIS   2  [place] MFM FETAL BPP WO NON              19147.82     KELLY DAVIS      STRESS  ----------------------------------------------------------------------   #  Order #                    Accession #                 Episode #   1  [id number]                  [id number]   2  [id number]                  [id number]                  [id number]  ---------------------------------------------------------------------- Indications   Premature rupture of membranes - leaking       O42.90   fluid   [redacted] weeks gestation of pregnancy                Z3A.31   Poor obstetric history: Previous preterm       O09.219   delivery, antepartum (23 weeks)   Encounter for antenatal screening for           Z36.3   malformations   Insufficient Prenatal Care (prenatal care @    O09.30   [place], left in September)  ---------------------------------------------------------------------- Fetal Evaluation  Num Of Fetuses:         1  Cardiac Activity:       Observed  Presentation:           Cephalic  Placenta:               Anterior  P. Cord Insertion:      Visualized  Amniotic Fluid  AFI FV:      Subjectively low-normal  AFI Sum(cm)     %Tile       Largest Pocket(cm)  8.63            5           4.09  RUQ(cm)                     LUQ(cm)        LLQ(cm)  4.09                        2.48           2.06 ---------------------------------------------------------------------- Biophysical Evaluation  Amniotic F.V:   Within normal limits       F. Tone:        Observed  F. Movement:    Observed                   Score:          8/8  F. Breathing:   Observed ---------------------------------------------------------------------- Biometry  BPD:      80.6  mm     G. Age:  32w 2d         76  %    CI:        73.33   %    70 - 86                                                          FL/HC:      20.0   %    19.3 - 21.3  HC:      299.1  mm     G. Age:  33w 1d         69  %    HC/AC:      1.06        0.96 - 1.17  AC:  283.1  mm     G. Age:  32w 2d         80  %    FL/BPD:     74.1   %    71 - 87  FL:       59.7  mm     G. Age:  31w 1d         35  %    FL/AC:      21.1   %    20 - 24  HUM:      52.2  mm     G. Age:  30w 3d         38  %  Est. FW:    1892  gm      4 lb 3 oz     73  % ---------------------------------------------------------------------- OB History  Gravidity:    4         Term:   1         SAB:   1  TOP:          1        Living:  1 ---------------------------------------------------------------------- Gestational Age  LMP:           31w 1d        Date:  07/05/17                 EDD:   04/11/18  U/S Today:     32w 2d                                        EDD:   04/03/18  Best:          31w 1d     Det. By:  LMP   (07/05/17)          EDD:   04/11/18 ---------------------------------------------------------------------- Anatomy  Cranium:               Appears normal         Aortic Arch:            Not well visualized  Cavum:                 Appears normal         Ductal Arch:            Not well visualized  Ventricles:            Appears normal         Diaphragm:              Appears normal  Choroid Plexus:        Appears normal         Stomach:                Appears normal, left                                                                        sided  Cerebellum:            Appears normal         Abdomen:  Appears normal  Posterior Fossa:       Not well visualized    Abdominal Wall:         Not well visualized  Nuchal Fold:           Not applicable (>20    Cord Vessels:           Appears normal ([redacted]                         wks GA)                                        vessel cord)  Face:                  Not well visualized    Kidneys:                Appear normal  Lips:                  Not well visualized    Bladder:                Appears normal  Thoracic:              Appears normal         Spine:                  Appears normal  Heart:                 Appears normal         Upper Extremities:      Visualized                         (4CH, axis, and situs  RVOT:                  Appears normal         Lower Extremities:      Visualized  LVOT:                  Appears normal  Other:  Technically difficult due to advanced gestational age. ---------------------------------------------------------------------- Cervix Uterus Adnexa  Cervix  Not visualized (advanced GA >24wks)  Adnexa  No abnormality visualized. ---------------------------------------------------------------------- Impression  Patient is admitted with the diagnosis PPROM.  On ultrasound, amniotic fluid is slightly decreased for this  gestational age (5th percentile), but no oligohydrmnios is  seen.  Fetal growth is appropriate for gestational  age. Fetal anatomy  appears normal, but limited by advanced gestational age.  Antenatal testing is reassuring. BPP 8/8. ---------------------------------------------------------------------- Recommendations  -Weekly antenatal testing till delivery. ----------------------------------------------------------------------                  Noralee Space, MD Electronically Signed Final Report   02/09/2018 09:26 am ----------------------------------------------------------------------  [place] Mfm Ob Comp + 14 Wk  Result Date: 02/09/2018 ----------------------------------------------------------------------  OBSTETRICS REPORT                       (Signed Final 02/09/2018 09:26 am) ---------------------------------------------------------------------- Patient Info  ID #:       [id number]                          D.O.B.:  [birth year] (32 yrs)  Name:  [name]               Visit Date: 02/08/2018 05:22 pm ---------------------------------------------------------------------- Performed By  Performed By:     Marcellina Millin          Ref. Address:     [address]                    RDMS                                                             Rd  Attending:        Noralee Space MD        Secondary Phy.:   3rd Nursing- HR                                                             OB                                                             3rd Floor  Referred By:      Conan Bowens          Location:         [facility]                    MD ---------------------------------------------------------------------- Orders   #  Description                          Code         Ordered By   1  [place] MFM OB COMP + 14 WK               76805.01     KELLY DAVIS   2  [place] MFM FETAL BPP WO NON              76819.01     KELLY DAVIS      STRESS  ----------------------------------------------------------------------   #  Order #                    Accession #                 Episode #   1  [id number]                  [id number]                   [id number]   2  [id number]                  [id number]                  [id number]  ---------------------------------------------------------------------- Indications   Premature rupture of membranes - leaking       O42.90   fluid   [redacted] weeks  gestation of pregnancy                Z3A.31   Poor obstetric history: Previous preterm       O09.219   delivery, antepartum (23 weeks)   Encounter for antenatal screening for          Z36.3   malformations   Insufficient Prenatal Care (prenatal care @    O09.30   [place], left in September)  ---------------------------------------------------------------------- Fetal Evaluation  Num Of Fetuses:         1  Cardiac Activity:       Observed  Presentation:           Cephalic  Placenta:               Anterior  P. Cord Insertion:      Visualized  Amniotic Fluid  AFI FV:      Subjectively low-normal  AFI Sum(cm)     %Tile       Largest Pocket(cm)  8.63            5           4.09  RUQ(cm)                     LUQ(cm)        LLQ(cm)  4.09                        2.48           2.06 ---------------------------------------------------------------------- Biophysical Evaluation  Amniotic F.V:   Within normal limits       F. Tone:        Observed  F. Movement:    Observed                   Score:          8/8  F. Breathing:   Observed ---------------------------------------------------------------------- Biometry  BPD:      80.6  mm     G. Age:  32w 2d         76  %    CI:        73.33   %    70 - 86                                                          FL/HC:      20.0   %    19.3 - 21.3  HC:      299.1  mm     G. Age:  33w 1d         69  %    HC/AC:      1.06        0.96 - 1.17  AC:      283.1  mm     G. Age:  32w 2d         80  %    FL/BPD:     74.1   %    71 - 87  FL:       59.7  mm     G. Age:  31w 1d         35  %    FL/AC:  21.1   %    20 - 24  HUM:      52.2  mm     G. Age:  30w 3d         38  %  Est. FW:    1892  gm      4 lb 3 oz     73  %  ---------------------------------------------------------------------- OB History  Gravidity:    4         Term:   1         SAB:   1  TOP:          1        Living:  1 ---------------------------------------------------------------------- Gestational Age  LMP:           31w 1d        Date:  07/05/17                 EDD:   04/11/18  U/S Today:     32w 2d                                        EDD:   04/03/18  Best:          31w 1d     Det. By:  LMP  (07/05/17)          EDD:   04/11/18 ---------------------------------------------------------------------- Anatomy  Cranium:               Appears normal         Aortic Arch:            Not well visualized  Cavum:                 Appears normal         Ductal Arch:            Not well visualized  Ventricles:            Appears normal         Diaphragm:              Appears normal  Choroid Plexus:        Appears normal         Stomach:                Appears normal, left                                                                        sided  Cerebellum:            Appears normal         Abdomen:                Appears normal  Posterior Fossa:       Not well visualized    Abdominal Wall:         Not well visualized  Nuchal Fold:           Not applicable (>20    Cord Vessels:           Appears normal ([redacted]  wks GA)                                        vessel cord)  Face:                  Not well visualized    Kidneys:                Appear normal  Lips:                  Not well visualized    Bladder:                Appears normal  Thoracic:              Appears normal         Spine:                  Appears normal  Heart:                 Appears normal         Upper Extremities:      Visualized                         (4CH, axis, and situs  RVOT:                  Appears normal         Lower Extremities:      Visualized  LVOT:                  Appears normal  Other:  Technically difficult due to advanced gestational age.  ---------------------------------------------------------------------- Cervix Uterus Adnexa  Cervix  Not visualized (advanced GA >24wks)  Adnexa  No abnormality visualized. ---------------------------------------------------------------------- Impression  Patient is admitted with the diagnosis PPROM.  On ultrasound, amniotic fluid is slightly decreased for this  gestational age (5th percentile), but no oligohydrmnios is  seen.  Fetal growth is appropriate for gestational age. Fetal anatomy  appears normal, but limited by advanced gestational age.  Antenatal testing is reassuring. BPP 8/8. ---------------------------------------------------------------------- Recommendations  -Weekly antenatal testing till delivery. ----------------------------------------------------------------------                  Noralee Spaceavi Shankar, MD Electronically Signed Final Report   02/09/2018 09:26 am ----------------------------------------------------------------------   Future Appointments  Date Time Provider Department Center  02[facility]19[facility]2018  [facility]  02/19/2018  3:30 PM [facility]  02/21/2018  9:55 AM Reva BoresPratt, Monta Police S, MD [facility]    Discharge Condition: Stable     Allergies as of 02/13/2018   No Known Allergies     Medication List    ASK your doctor about these medications   hydrOXYzine 25 MG tablet Commonly known as:  ATARAX/VISTARIL Take 25 mg by mouth as needed for anxiety.   multivitamin-prenatal 27-0.8 MG Tabs tablet Take 1 tablet by mouth daily at 12 noon.   nicotine 14 mg/24hr patch Commonly known as:  NICODERM CQ - dosed in mg/24 hours Place 1 patch (14 mg total) onto the skin daily.        Signed: Reva Boresanya S Tamecia Mcdougald M.D. 02/15/2018, 11:47 AM

## 2018-02-15 NOTE — Discharge Summary (Signed)
Postpartum Discharge Summary     Patient Name: Deborah Park DOB: [birth year] MRN: [id number]  Date of admission: 02/14/2018 Delivering Provider: Paoli Bing   Date of discharge: 02/15/2018  Admitting diagnosis: 33 WKS, CTX Intrauterine pregnancy: [redacted]w[redacted]d     Secondary diagnosis:  Active Problems:   Insufficient prenatal care   Preterm contractions   Polysubstance abuse (HCC)   Preterm delivery  Additional problems: Delivery in MAU     Discharge diagnosis: Preterm Pregnancy Delivered                                                                                                Post partum procedures:none  Augmentation: delivered outside hospital  Complications: None  Hospital course:  Onset of Labor With Vaginal Delivery     32 y.o. yo G2P0101 at [redacted]w[redacted]d was admitted in postpartum state after precipitous delivery in the MAU on 02/14/2018. Patient had an uncomplicated labor course as follows:  Membrane Rupture Time/Date:   ,    Intrapartum Procedures: Episiotomy: None [1]                                         Lacerations:  Periurethral [8]  Patient had a delivery of a Viable infant. 02/14/2018  Information for the patient's newborn:  Jerlisa, Diliberto [[id number]]  Delivery Method: Vaginal, Spontaneous(Filed from Delivery Summary)    Pateint had an uncomplicated postpartum course.  She is ambulating, tolerating a regular diet, passing flatus, and urinating well. Patient is discharged home in stable condition on 02/15/18.   Magnesium Sulfate recieved: No BMZ received: No  Physical exam  Vitals:   02/15/18 0030 02/15/18 0506 02/15/18 0753 02/15/18 1202  BP: 115/84 112/79 (!) 78/63 (!) 127/96  Pulse: (!) 109 (!) 113 (!) 103 97  Resp:  19 19 19   Temp: 98.2 F (36.8 C) 98.6 F (37 C) 98.3 F (36.8 C) 98.2 F (36.8 C)  TempSrc:  Oral Oral Oral  SpO2: 99% 98% 100% 99%   General: alert, cooperative and no distress Lochia: appropriate Uterine Fundus:  firm Incision: N/A DVT Evaluation: No evidence of DVT seen on physical exam. Labs: Lab Results  Component Value Date   WBC 19.5 (H) 02/14/2018   HGB 13.3 02/14/2018   HCT 40.0 02/14/2018   MCV 99.0 02/14/2018   PLT 225 02/14/2018   CMP Latest Ref Rng & Units 02/14/2018  Glucose 70 - 99 mg/dL 99  BUN 6 - 20 mg/dL 16  Creatinine 7.82 - 9.56 mg/dL 2.13  Sodium 086 - 578 mmol/L 131(L)  Potassium 3.5 - 5.1 mmol/L 3.3(L)  Chloride 98 - 111 mmol/L 96(L)  CO2 22 - 32 mmol/L 20(L)  Calcium 8.9 - 10.3 mg/dL 8.9  Total Protein 6.5 - 8.1 g/dL 6.5  Total Bilirubin 0.3 - 1.2 mg/dL 0.8  Alkaline Phos 38 - 126 U/L 131(H)  AST 15 - 41 U/L 61(H)  ALT 0 - 44 U/L 66(H)    Discharge instruction: per After Visit Summary and "Baby  and Me Booklet".  After visit meds:  Allergies as of 02/15/2018   No Known Allergies     Medication List    STOP taking these medications   amoxicillin 500 MG tablet Commonly known as:  AMOXIL   hydrOXYzine 25 MG tablet Commonly known as:  ATARAX/VISTARIL   nicotine 14 mg/24hr patch Commonly known as:  NICODERM CQ - dosed in mg/24 hours     TAKE these medications   ibuprofen 600 MG tablet Commonly known as:  ADVIL,MOTRIN Take 1 tablet (600 mg total) by mouth every 6 (six) hours.   multivitamin-prenatal 27-0.8 MG Tabs tablet Take 1 tablet by mouth daily at 12 noon.       Diet: routine diet  Activity: Advance as tolerated. Pelvic rest for 6 weeks.   Outpatient follow up:4 weeks Follow up Appt: Future Appointments  Date Time Provider Department Center  02/19/2018  [facility]  02/19/2018  3:30 PM [facility]-NEW OB INTAKE [facility]  02/21/2018  9:55 AM Reva BoresPratt, Tanya S, MD [facility]   Follow up Visit: Follow-up Information    [facility] week(s).   Specialty:  Obstetrics and Gynecology Contact information: [address] [place]  [id number] [phone]           Please schedule this patient for Postpartum visit in: 4 weeks with the following provider: Any provider For C/S patients schedule nurse incision check in weeks 2 weeks: no High risk pregnancy complicated by: preterm premature rupture of membranes who presented in active labor and delivered in MAU Delivery mode:  SVD Anticipated Birth Control:  other/unsure PP Procedures needed:    Schedule Integrated BH visit: yes      Newborn Data: Live born female  Birth Weight: 3 lb 7.4 oz (1570 g) APGAR: 7, 8  Newborn Delivery   Birth date/time:  02/14/2018 21:21:00 Delivery type:  Vaginal, Spontaneous     Baby Feeding: tube feeding, breast Disposition:NICU   02/15/2018 Scheryl DarterJames Cambridge Deleo, MD

## 2018-02-15 NOTE — Progress Notes (Signed)
CSW acknowledged consult and completed a clinical assessment.  There are no barriers to MOB's d/c.  Clinical assessment notes will be entered at a later time.  [place]  Blaine HamperAngel Boyd-Gilyard, MSW, LCSW Clinical Social Work [phone]

## 2018-02-19 ENCOUNTER — Telehealth: Payer: Self-pay | Admitting: Family Medicine

## 2018-02-19 NOTE — Progress Notes (Signed)
CSW spoke with CPS worker, Hillery AldoLatanya Cole [phone]) via telephone. CPS communicated that at this time there are barriers to infant discharging to MOB and FOB.  CPS has a scheduled CFT with MOB on Monday (11/25).  CPS agreed to contact CSW to provide an update and safety disposition plan.   There are barriers to infant's discharge.   Blaine HamperAngel Boyd-Gilyard, MSW, LCSW Clinical Social Work [phone]

## 2018-02-19 NOTE — Telephone Encounter (Signed)
Left a VM for patient that her missed appointment has been rescheduled.
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
## 2018-02-21 ENCOUNTER — Encounter: Payer: Medicaid Other | Admitting: Family Medicine

## 2018-02-22 ENCOUNTER — Telehealth: Payer: Self-pay | Admitting: Family Medicine

## 2018-02-22 NOTE — Telephone Encounter (Signed)
Called patient about her appointment. Had to leave a voicemail.

## 2018-02-27 ENCOUNTER — Encounter: Payer: Medicaid Other | Admitting: Internal Medicine

## 2018-02-27 NOTE — BH Specialist Note (Deleted)
Integrated Behavioral Health Initial Visit  MRN: [id number] Name: Deborah Park  Number of Integrated Behavioral Health Clinician visits:: {IBH Number of Visits:21014052} Session Start time: ***  Session End time: *** Total time: {IBH Total Time:21014050}  Type of Service: Integrated Behavioral Health- Individual/Family Interpretor:{yes WU:[id number]}no:314532} Interpretor Name and Language: ***   Warm Hand Off Completed.       SUBJECTIVE: Deborah FreibergSamantha A Levit is a 32 y.o. female accompanied by {CHL AMB ACCOMPANIED YN:[id number]}[phone]} Patient was referred by *** for ***. Patient reports the following symptoms/concerns: *** Duration of problem: ***; Severity of problem: {Mild/Moderate/Severe:20260}  OBJECTIVE: Mood: {BHH MOOD:22306} and Affect: {BHH AFFECT:22307} Risk of harm to self or others: {CHL AMB BH Suicide Current Mental Status:21022748}  LIFE CONTEXT: Family and Social: *** School/Work: *** Self-Care: *** Life Changes: ***  GOALS ADDRESSED: Patient will: 1. Reduce symptoms of: {IBH Symptoms:21014056} 2. Increase knowledge and/or ability of: {IBH Patient Tools:21014057}  3. Demonstrate ability to: {IBH Goals:21014053}  INTERVENTIONS: Interventions utilized: {IBH Interventions:21014054}  Standardized Assessments completed: {IBH Screening Tools:21014051}  ASSESSMENT: Patient currently experiencing ***.   Patient may benefit from ***.  PLAN: 1. Follow up with behavioral health clinician on : *** 2. Behavioral recommendations: *** 3. Referral(s): {IBH Referrals:21014055} 4. "From scale of 1-10, how likely are you to follow plan?": ***  Valetta CloseJamie C Johnda Billiot, LCSW

## 2018-03-15 ENCOUNTER — Encounter: Payer: Medicaid Other | Admitting: Family Medicine

## 2018-03-29 ENCOUNTER — Ambulatory Visit: Payer: Medicaid Other | Admitting: Student

## 2018-03-29 ENCOUNTER — Encounter: Payer: Self-pay | Admitting: Student

## 2018-07-04 ENCOUNTER — Encounter (HOSPITAL_COMMUNITY): Payer: Self-pay

## 2018-08-29 ENCOUNTER — Inpatient Hospital Stay (HOSPITAL_COMMUNITY)
Admission: RE | Admit: 2018-08-29 | Discharge: 2018-08-30 | DRG: 885 | Disposition: A | Discharge status: Home / Self Care | Payer: Medicaid Other | Attending: Psychiatry | Admitting: Psychiatry

## 2018-08-29 ENCOUNTER — Encounter (HOSPITAL_COMMUNITY): Payer: Self-pay

## 2018-08-29 ENCOUNTER — Other Ambulatory Visit: Payer: Self-pay

## 2018-08-29 DIAGNOSIS — F1490 Cocaine use, unspecified, uncomplicated: Secondary | ICD-10-CM | POA: Diagnosis present

## 2018-08-29 DIAGNOSIS — Z818 Family history of other mental and behavioral disorders: Secondary | ICD-10-CM

## 2018-08-29 DIAGNOSIS — G479 Sleep disorder, unspecified: Secondary | ICD-10-CM | POA: Diagnosis present

## 2018-08-29 DIAGNOSIS — F323 Major depressive disorder, single episode, severe with psychotic features: Principal | ICD-10-CM | POA: Diagnosis present

## 2018-08-29 DIAGNOSIS — F17210 Nicotine dependence, cigarettes, uncomplicated: Secondary | ICD-10-CM | POA: Diagnosis present

## 2018-08-29 DIAGNOSIS — Q0700 Arnold-Chiari syndrome without spina bifida or hydrocephalus: Secondary | ICD-10-CM

## 2018-08-29 DIAGNOSIS — F1590 Other stimulant use, unspecified, uncomplicated: Secondary | ICD-10-CM | POA: Diagnosis present

## 2018-08-29 DIAGNOSIS — F329 Major depressive disorder, single episode, unspecified: Secondary | ICD-10-CM | POA: Diagnosis present

## 2018-08-29 DIAGNOSIS — F419 Anxiety disorder, unspecified: Secondary | ICD-10-CM | POA: Diagnosis present

## 2018-08-29 MED ORDER — ALUM & MAG HYDROXIDE-SIMETH 200-200-20 MG/5ML PO SUSP: 30.0000 mL | ORAL | Status: DC | PRN

## 2018-08-29 MED ORDER — TRAZODONE HCL 50 MG PO TABS: 50.0000 mg | ORAL_TABLET | Freq: Every evening | ORAL | Status: DC | PRN

## 2018-08-29 MED ORDER — ACETAMINOPHEN 325 MG PO TABS: 650.0000 mg | ORAL_TABLET | Freq: Four times a day (QID) | ORAL | Status: DC | PRN

## 2018-08-29 MED ORDER — HYDROXYZINE HCL 25 MG PO TABS: 25.0000 mg | ORAL_TABLET | Freq: Three times a day (TID) | ORAL | Status: DC | PRN

## 2018-08-29 MED ORDER — MAGNESIUM HYDROXIDE 400 MG/5ML PO SUSP: 30.0000 mL | Freq: Every day | ORAL | Status: DC | PRN

## 2018-08-29 NOTE — Progress Notes (Addendum)
D: Pt received admission education/information. Pt oriented to unit and unit rules. Pt alert and oriented. Pt affect/mood appears as anxiety/irritable. Pt denies experiencing any pain, SI/HI, or AVH at this time.   Skin assessment performed. Superficial scratch on L FA.   Pt reports stressors as being a loss of job, not having a means to communicate (no cell phone, no laptop.Marland Kitchen) needs for alterative living arrangements, and no personal means for transportation (no personal vehicle).  A: Pt is oriented to [facility] facility and facility expectations.Support and encouragement provided. Frequent verbal contact made. Routine safety checks conducted q15 minutes.   R: Pt verbalizes understanding of admission education/information. Pt verbalizes understanding of unit rules and expectations. Pt verbally contracts for safety at this time. Pt's interacts with staff are apprehensive and hesitant. Pt remains safe at this time. Will continue to monitor.
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
## 2018-08-29 NOTE — Progress Notes (Signed)
Met with pt 1:1.  Introduced self to pt.  Pt is irritable upon assessment.  She denies SI/HI, hallucinations, and pain.  She asked writer to go over the schedule with her and this was done.  Additional questions were answered by writer and she states "okay, thank you."  Pt denies needs and concerns at this time.  She returned to her room.  Pt is safe on the unit.  Q15 minute safety checks in place.

## 2018-08-29 NOTE — Tx Team (Signed)
Initial Treatment Plan 08/29/2018 7:59 PM Deborah Park GYF:[id number]    PATIENT STRESSORS: Financial difficulties Marital or family conflict Substance abuse   PATIENT STRENGTHS: Motivation for treatment/growth Supportive family/friends   PATIENT IDENTIFIED PROBLEMS: Ineffective coping skills  Ineffective communication skills                   DISCHARGE CRITERIA:  Improved stabilization in mood, thinking, and/or behavior Motivation to continue treatment in a less acute level of care  PRELIMINARY DISCHARGE PLAN: Outpatient therapy Placement in alternative living arrangements  PATIENT/FAMILY INVOLVEMENT: This treatment plan has been presented to and reviewed with the patient, Deborah Park, and/or family member.  The patient and family have been given the opportunity to ask questions and make suggestions.  [name], RN 08/29/2018, 7:59 PM

## 2018-08-30 DIAGNOSIS — F323 Major depressive disorder, single episode, severe with psychotic features: Principal | ICD-10-CM

## 2018-08-30 NOTE — Progress Notes (Signed)
  [facility] Adult Case Management Discharge Plan :  Will you be returning to the same living situation after discharge:  No. Patient reports she is discharging home with a close friend.  At discharge, do you have transportation home?: Yes,  patient reports her friend is picking her up at discharge  Do you have the ability to pay for your medications: Yes,  Medicaid   Release of information consent forms completed and in the chart;  Patient's signature needed at discharge.  Patient to Follow up at: Follow-up Information    [facility] Follow up on 09/03/2018.   Why:  Hospital follow up appointment is Tuesday, 6/2 at 11:00a.  Appointment will be over the phone and the provider will contact you.  Contact information: [address] [place] [address] [phone]           Next level of care provider has access to [facility] Link:yes  Safety Planning and Suicide Prevention discussed: Yes,  with the patient     Has patient been referred to the Quitline?: N/A patient is not a smoker  Patient has been referred for addiction treatment: N/A  Maeola Sarah, LCSWA 08/30/2018, 9:56 AM

## 2018-08-30 NOTE — Progress Notes (Signed)
D: Patient observed restless, anxious and agitated. Patient angry and feels she was misled regarding her admission. "That nurse who admitted me? She said I was just coming down here and that I would be assessed and a plan should be formed. I want to be transferred to another facility." Patient's affect animated, anxious with labile mood.   Denies pain, physical complaints. COVID-19 screen negative, afebrile. Respiratory assessment WDL.  A: Patient has no scheduled meds and does not wish to take any. Levell III obs in place for safety. Emotional support offered and attempted.  Encouraged to attend and participate in unit programming.   R: Patient verbalizes some understanding of POC however remains frustrated. Anxious to speak with MD as she maintains she is not a risk to herself or others. No evidence of internal stimuli observed or reported. Patient remains safe on level III obs. Will continue to monitor throughout the night.

## 2018-08-30 NOTE — [facility] Suicide Risk Assessment (Addendum)
[facility] INPATIENT:  Family/Significant Other Suicide Prevention Education  Suicide Prevention Education:   Patient Refusal for Family/Significant Other Suicide Prevention Education: The patient Deborah Park has refused to provide written consent for family/significant other to be provided Family/Significant Other Suicide Prevention Education during admission and/or prior to discharge.  Physician notified.  SPE completed with patient, as patient refused to consent to family contact. SPI pamphlet provided to pt and pt was encouraged to share information with support network, ask questions, and talk about any concerns relating to SPE. Patient denies access to guns/firearms and verbalized understanding of information provided. Mobile Crisis information also provided to patient.    Maeola Sarah 08/30/2018, 9:55 AM

## 2018-08-30 NOTE — Discharge Summary (Signed)
Physician Discharge Summary Note  Patient:  Deborah Park is an 33 y.o., female MRN:  [id number] DOB:  01/31/1986 Patient phone:  [phone] (home)  Patient address:   [address] [place] [address],  Total Time spent with patient: 45 minutes  Date of Admission:  08/29/2018 Date of Discharge: 08/30/2018  Reason for Admission:   Deborah Park is an 33 y.o. female. Pt is admitted to the inpatient unit for depression with psychosis. Pt is tearful and in obvious distress during assessment. She is paranoid. She presented as a walk-in accompanied by her mother. Pt is currently residing with her mother. Her 2 sons have been residing with her mother for some time. Her 79 month old infant was removed from her care from birth due to methamphetamine and cocaine being present in the baby's system. Pt stated she has been living in a rental property her mother owns with the father of the baby. She began hearing voices in the attic of the house in March. She also stated her phone has been cloned and after 5 different phones she still can not get a phone that is not being taken over by someone else. Pt's mother confirmed that her ex-boyfriend did plant a cloning device which is allowing him to clone any phone the Pt gets. Pt is willing to be admitted voluntarily. She currently takes no medication except for vistaril. Labs will be ordered for morning draw. Pt has poor sleep and poor appetite.    Principal Problem: MDD (major depressive disorder), single episode, severe with psychosis (HCC) Discharge Diagnoses: Principal Problem:   MDD (major depressive disorder), single episode, severe with psychosis (HCC) Active Problems:   Sleep disturbance   Past Psychiatric History: neg  Past Medical History:  Past Medical History:  Diagnosis Date  . Anxiety   . Chiari malformation type II (HCC)   . Memory loss 09/17/2013  . Sleep disturbance 09/17/2013    Past Surgical History:  Procedure Laterality  Date  . KIDNEY SURGERY     Family History:  Family History  Problem Relation Age of Onset  . Healthy Mother   . Healthy Father   . Bipolar disorder Maternal Grandmother   . Seizures Neg Hx   . Parkinsonism Neg Hx   . Multiple sclerosis Neg Hx   . Dementia Neg Hx    Family Psychiatric  History: neg Social History:  Social History   Substance and Sexual Activity  Alcohol Use Not Currently     Social History   Substance and Sexual Activity  Drug Use Yes  . Types: Amphetamines, Cocaine   Comment: last report 08 Feb 2018. pt reports none in preg    Social History   Socioeconomic History  . Marital status: Media planner    Spouse name: Not on file  . Number of children: 1  . Years of education: College  . Highest education level: Not on file  Occupational History    Employer: NOT EMPLOYED    Comment: not employed  Social Needs  . Financial resource strain: Not on file  . Food insecurity:    Worry: Not on file    Inability: Not on file  . Transportation needs:    Medical: Not on file    Non-medical: Not on file  Tobacco Use  . Smoking status: Current Every Day Smoker    Packs/day: 0.50    Years: 15.00    Pack years: 7.50    Types: Cigarettes  . Smokeless tobacco:  Never Used  Substance and Sexual Activity  . Alcohol use: Not Currently  . Drug use: Yes    Types: Amphetamines, Cocaine    Comment: last report 08 Feb 2018. pt reports none in preg  . Sexual activity: Not Currently    Birth control/protection: None  Lifestyle  . Physical activity:    Days per week: Not on file    Minutes per session: Not on file  . Stress: Not on file  Relationships  . Social connections:    Talks on phone: Not on file    Gets together: Not on file    Attends religious service: Not on file    Active member of club or organization: Not on file    Attends meetings of clubs or organizations: Not on file    Relationship status: Not on file  Other Topics Concern  . Not on file   Social History Narrative   Patient lives at home with son.   Caffeine use; 1-3 cups daily,is right handed    Hospital Course:   Patient was admitted under routine precautions displayed no danger behaviors was consistent in her story.  Collateral history revealed from the mother that there were certainly some concerns about the boyfriend hacking the patient's phone and cloning and so forth so it is unclear whether this is a folie  deux, or if in fact it is accurate but the bottom line is the patient is alert fully oriented cooperative denies wanting to harm self or others denying auditory visual hallucinations not meeting the criteria for inpatient care she was screened and no she expressed concerns about the phone she stated she would simply get another phone and the situation would be resolved so again even if this is paranoia there is no treatment needed at this point in time on a forced basis  Physical Findings: AIMS: Facial and Oral Movements Muscles of Facial Expression: None, normal Lips and Perioral Area: None, normal Jaw: None, normal Tongue: None, normal,Extremity Movements Upper (arms, wrists, hands, fingers): None, normal Lower (legs, knees, ankles, toes): None, normal, Trunk Movements Neck, shoulders, hips: None, normal, Overall Severity Severity of abnormal movements (highest score from questions above): None, normal Incapacitation due to abnormal movements: None, normal Patient's awareness of abnormal movements (rate only patient's report): No Awareness, Dental Status Current problems with teeth and/or dentures?: No Does patient usually wear dentures?: No  CIWA:  CIWA-Ar Total: 8 COWS:  COWS Total Score: 2  Musculoskeletal: Strength & Muscle Tone: within normal limits Gait & Station: normal Patient leans: N/A  Psychiatric Specialty Exam: Physical Exam  ROS  Blood pressure 117/85, pulse 83, temperature 98.6 F (37 C), temperature source Oral, resp. rate 18, height 5'  3.58" (1.615 m), weight 49 kg, SpO2 100 %, unknown if currently breastfeeding.Body mass index is 18.78 kg/m.  General Appearance: Casual  Eye Contact:  Good  Speech:  Clear and Coherent  Volume:  Normal  Mood:  Dysphoric  Affect:  Appropriate  Thought Process:  Coherent and Descriptions of Associations: Intact  Orientation:  Full (Time, Place, and Person)  Thought Content:  Logical  Suicidal Thoughts:  No  Homicidal Thoughts:  No  Memory:  Immediate;   Good  Judgement:  Good  Insight:  Good  Psychomotor Activity:  Normal  Concentration:  Concentration: Good  Recall:  Good  Fund of Knowledge:  Good  Language:  Good  Akathisia:  Negative  Handed:  Right  AIMS (if indicated):     Assets:  Physical Health Resilience  ADL's:  Intact  Cognition:  WNL  Sleep:  Number of Hours: 5.5        Has this patient used any form of tobacco in the last 30 days? (Cigarettes, Smokeless Tobacco, Cigars, and/or Pipes) Yes, No  Blood Alcohol level:  Lab Results  Component Value Date   [facility]  12/01/2008    <5        LOWEST DETECTABLE LIMIT FOR SERUM ALCOHOL IS 5 mg/dL FOR MEDICAL PURPOSES ONLY    Metabolic Disorder Labs:  Lab Results  Component Value Date   HGBA1C 4.6 (L) 02/08/2018   MPG 85.32 02/08/2018   No results found for: PROLACTIN No results found for: CHOL, TRIG, HDL, CHOLHDL, VLDL, LDLCALC  See Psychiatric Specialty Exam and Suicide Risk Assessment completed by Attending Physician prior to discharge.  Discharge destination:  Home  Is patient on multiple antipsychotic therapies at discharge:  No   Has Patient had three or more failed trials of antipsychotic monotherapy by history:  No  Recommended Plan for Multiple Antipsychotic Therapies: NA   Allergies as of 08/30/2018   No Known Allergies     Medication List    You have not been prescribed any medications.    Follow-up Information    [facility] Follow up.   Contact information: [address] [place]  [id number] [phone]            SignedMalvin Johns: Haili Donofrio, MD 08/30/2018, 9:48 AM

## 2018-08-30 NOTE — Progress Notes (Signed)
Patient was given urine cup with instructions. Verbalized understanding.

## 2018-08-30 NOTE — Progress Notes (Signed)
Did not attend group 

## 2018-08-30 NOTE — Tx Team (Signed)
Interdisciplinary Treatment and Diagnostic Plan Update  08/30/2018 Time of Session:  Deborah FreibergSamantha A Blasko MRN: [id number]  Principal Diagnosis: MDD (major depressive disorder), single episode, severe with psychosis (HCC)  Secondary Diagnoses: Principal Problem:   MDD (major depressive disorder), single episode, severe with psychosis (HCC) Active Problems:   Sleep disturbance   Current Medications:  Current Facility-Administered Medications  Medication Dose Route Frequency Provider Last Rate Last Dose  . acetaminophen (TYLENOL) tablet 650 mg  650 mg Oral Q6H PRN Laveda AbbeParks, Laurie Britton, NP      . alum & mag hydroxide-simeth (MAALOX/MYLANTA) 200-200-20 MG/5ML suspension 30 mL  30 mL Oral Q4H PRN Laveda AbbeParks, Laurie Britton, NP      . hydrOXYzine (ATARAX/VISTARIL) tablet 25 mg  25 mg Oral TID PRN Laveda AbbeParks, Laurie Britton, NP      . magnesium hydroxide (MILK OF MAGNESIA) suspension 30 mL  30 mL Oral Daily PRN Laveda AbbeParks, Laurie Britton, NP      . traZODone (DESYREL) tablet 50 mg  50 mg Oral QHS PRN Laveda AbbeParks, Laurie Britton, NP       PTA Medications: No medications prior to admission.    Patient Stressors: Financial difficulties Marital or family conflict Substance abuse  Patient Strengths: Motivation for treatment/growth Supportive family/friends  Treatment Modalities: Medication Management, Group therapy, Case management,  1 to 1 session with clinician, Psychoeducation, Recreational therapy.   Physician Treatment Plan for Primary Diagnosis: MDD (major depressive disorder), single episode, severe with psychosis (HCC) Long Term Goal(s):     Short Term Goals:    Medication Management: Evaluate patient's response, side effects, and tolerance of medication regimen.  Therapeutic Interventions: 1 to 1 sessions, Unit Group sessions and Medication administration.  Evaluation of Outcomes: Adequate for Discharge  Physician Treatment Plan for Secondary Diagnosis: Principal Problem:   MDD (major  depressive disorder), single episode, severe with psychosis (HCC) Active Problems:   Sleep disturbance  Long Term Goal(s):     Short Term Goals:       Medication Management: Evaluate patient's response, side effects, and tolerance of medication regimen.  Therapeutic Interventions: 1 to 1 sessions, Unit Group sessions and Medication administration.  Evaluation of Outcomes: Adequate for Discharge   RN Treatment Plan for Primary Diagnosis: MDD (major depressive disorder), single episode, severe with psychosis (HCC) Long Term Goal(s): Knowledge of disease and therapeutic regimen to maintain health will improve  Short Term Goals: Ability to verbalize feelings will improve and Ability to identify and develop effective coping behaviors will improve  Medication Management: RN will administer medications as ordered by provider, will assess and evaluate patient's response and provide education to patient for prescribed medication. RN will report any adverse and/or side effects to prescribing provider.  Therapeutic Interventions: 1 on 1 counseling sessions, Psychoeducation, Medication administration, Evaluate responses to treatment, Monitor vital signs and CBGs as ordered, Perform/monitor CIWA, COWS, AIMS and Fall Risk screenings as ordered, Perform wound care treatments as ordered.  Evaluation of Outcomes: Adequate for Discharge   LCSW Treatment Plan for Primary Diagnosis: MDD (major depressive disorder), single episode, severe with psychosis (HCC) Long Term Goal(s): Safe transition to appropriate next level of care at discharge, Engage patient in therapeutic group addressing interpersonal concerns.  Short Term Goals: Engage patient in aftercare planning with referrals and resources  Therapeutic Interventions: Assess for all discharge needs, 1 to 1 time with Social worker, Explore available resources and support systems, Assess for adequacy in community support network, Educate family and  significant other(s) on suicide prevention, Complete Psychosocial Assessment, Interpersonal  group therapy.  Evaluation of Outcomes: Adequate for Discharge   Progress in Treatment: Attending groups: No. Participating in groups: No. Taking medication as prescribed: Yes. Toleration medication: Yes. Family/Significant other contact made: No, will contact:  patient declined consent Patient understands diagnosis: Yes. Discussing patient identified problems/goals with staff: Yes. Medical problems stabilized or resolved: Yes. Denies suicidal/homicidal ideation: Yes. Issues/concerns per patient self-inventory: No. Other:   New problem(s) identified: None   New Short Term/Long Term Goal(s):medication stabilization, elimination of SI thoughts, development of comprehensive mental wellness plan.    Patient Goals:    Discharge Plan or Barriers: Patient plans to return home with a friend. Patient plans to follow up with [facility] for outpatient medication management and therapy services.   Reason for Continuation of Hospitalization: None   Estimated Length of Stay: 08/30/2018  Attendees: Patient: 08/30/2018 12:53 PM  Physician: Dr. Nehemiah Massed, MD 08/30/2018 12:53 PM  Nursing: Casimiro Needle.Kathie Rhodes, RN 08/30/2018 12:53 PM  RN Care Manager: 08/30/2018 12:53 PM  Social Worker: Baldo Daub, LCSWA 08/30/2018 12:53 PM  Recreational Therapist:  08/30/2018 12:53 PM  Other:  08/30/2018 12:53 PM  Other:  08/30/2018 12:53 PM  Other: 08/30/2018 12:53 PM    Scribe for Treatment Team: Maeola Sarah, LCSWA 08/30/2018 12:53 PM

## 2018-08-30 NOTE — Plan of Care (Signed)
Discharge note  Patient verbalizes readiness for discharge. Follow up plan explained, AVS, Transition record and SRA given. Prescriptions and teaching provided. Belongings returned and signed for. Suicide safety plan completed and signed. Patient verbalizes understanding. Patient denies SI/HI and assures this Clinical research associatewriter he will seek assistance should that change. Patient discharged to lobby where pt had the keys to her car.  Problem: Education: Goal: Ability to state activities that reduce stress will improve Outcome: Adequate for Discharge   Problem: Coping: Goal: Ability to identify and develop effective coping behavior will improve Outcome: Adequate for Discharge   Problem: Self-Concept: Goal: Ability to identify factors that promote anxiety will improve Outcome: Adequate for Discharge Goal: Level of anxiety will decrease Outcome: Adequate for Discharge Goal: Ability to modify response to factors that promote anxiety will improve Outcome: Adequate for Discharge   Problem: Activity: Goal: Will identify at least one activity in which they can participate Outcome: Adequate for Discharge   Problem: Coping: Goal: Ability to identify and develop effective coping behavior will improve Outcome: Adequate for Discharge Goal: Ability to interact with others will improve Outcome: Adequate for Discharge Goal: Demonstration of participation in decision-making regarding own care will improve Outcome: Adequate for Discharge Goal: Ability to use eye contact when communicating with others will improve Outcome: Adequate for Discharge   Problem: Health Behavior/Discharge Planning: Goal: Identification of resources available to assist in meeting health care needs will improve Outcome: Adequate for Discharge   Problem: Self-Concept: Goal: Will verbalize positive feelings about self Outcome: Adequate for Discharge   Problem: Education: Goal: Knowledge of [facility] Education  information/materials will improve Outcome: Adequate for Discharge Goal: Emotional status will improve Outcome: Adequate for Discharge Goal: Mental status will improve Outcome: Adequate for Discharge Goal: Verbalization of understanding the information provided will improve Outcome: Adequate for Discharge   Problem: Activity: Goal: Interest or engagement in activities will improve Outcome: Adequate for Discharge Goal: Sleeping patterns will improve Outcome: Adequate for Discharge   Problem: Coping: Goal: Ability to verbalize frustrations and anger appropriately will improve Outcome: Adequate for Discharge Goal: Ability to demonstrate self-control will improve Outcome: Adequate for Discharge   Problem: Health Behavior/Discharge Planning: Goal: Identification of resources available to assist in meeting health care needs will improve Outcome: Adequate for Discharge Goal: Compliance with treatment plan for underlying cause of condition will improve Outcome: Adequate for Discharge   Problem: Physical Regulation: Goal: Ability to maintain clinical measurements within normal limits will improve Outcome: Adequate for Discharge   Problem: Safety: Goal: Periods of time without injury will increase Outcome: Adequate for Discharge   Problem: Education: Goal: Utilization of techniques to improve thought processes will improve Outcome: Adequate for Discharge Goal: Knowledge of the prescribed therapeutic regimen will improve Outcome: Adequate for Discharge   Problem: Activity: Goal: Interest or engagement in leisure activities will improve Outcome: Adequate for Discharge Goal: Imbalance in normal sleep/wake cycle will improve Outcome: Adequate for Discharge   Problem: Coping: Goal: Coping ability will improve Outcome: Adequate for Discharge Goal: Will verbalize feelings Outcome: Adequate for Discharge   Problem: Health Behavior/Discharge Planning: Goal: Ability to make  decisions will improve Outcome: Adequate for Discharge Goal: Compliance with therapeutic regimen will improve Outcome: Adequate for Discharge   Problem: Role Relationship: Goal: Will demonstrate positive changes in social behaviors and relationships Outcome: Adequate for Discharge   Problem: Safety: Goal: Ability to disclose and discuss suicidal ideas will improve Outcome: Adequate for Discharge Goal: Ability to identify and utilize support systems that promote  safety will improve Outcome: Adequate for Discharge   Problem: Self-Concept: Goal: Will verbalize positive feelings about self Outcome: Adequate for Discharge Goal: Level of anxiety will decrease Outcome: Adequate for Discharge

## 2018-08-30 NOTE — [facility] Counselor (Signed)
Patient was admitted 08/29/2018 and was cleared for discharge on 08/30/2018. CSW was not able to complete PSA due to patient discharging in less than 24 hours of admission.   Patient was agreeable to follow up with [facility] for outpatient services, however she refused to sign any ROI's to release information to their agency. Patient has a history with [facility] and reports being an established client.   CSW will continue to follow for a safe discharge.   Baldo Daub, MSW, LCSWA Clinical Social Worker [facility]  Phone: [phone]

## 2018-08-30 NOTE — Progress Notes (Signed)
Patient signed 72 hour RFD which was placed on paper chart.

## 2018-08-30 NOTE — [facility] Suicide Risk Assessment (Signed)
[facility] Discharge Suicide Risk Assessment   Principal Problem: MDD (major depressive disorder), single episode, severe with psychosis (HCC) Discharge Diagnoses: Principal Problem:   MDD (major depressive disorder), single episode, severe with psychosis (HCC) Active Problems:   Sleep disturbance Alert oriented fully cooperative denies suicidal thoughts plans or intents contracts fully questing discharge no psychosis discerned  Total Time spent with patient: 45 minutes Mental Status Per Nursing Assessment::   On Admission:  NA  Demographic Factors:  Caucasian  Loss Factors: NA  Historical Factors: NA  Risk Reduction Factors:   Positive social support  Continued Clinical Symptoms:  NEG  Cognitive Features That Contribute To Risk:  None    Suicide Risk:  Minimal: No identifiable suicidal ideation.  Patients presenting with no risk factors but with morbid ruminations; may be classified as minimal risk based on the severity of the depressive symptoms  Follow-up Information    [facility] Follow up.   Contact information: [address] [place] [address] [phone]           Plan Of Care/Follow-up recommendations:  Activity:  full  Lakyn Mantione, MD 08/30/2018, 9:46 AM

## 2018-11-02 ENCOUNTER — Encounter (HOSPITAL_COMMUNITY): Payer: Self-pay

## 2018-11-02 ENCOUNTER — Emergency Department (HOSPITAL_COMMUNITY)
Admission: EM | Admit: 2018-11-02 | Discharge: 2018-11-05 | Disposition: A | Discharge status: Home / Self Care | Payer: Medicaid Other | Attending: Emergency Medicine | Admitting: Emergency Medicine

## 2018-11-02 ENCOUNTER — Other Ambulatory Visit: Payer: Self-pay

## 2018-11-02 DIAGNOSIS — F419 Anxiety disorder, unspecified: Secondary | ICD-10-CM | POA: Insufficient documentation

## 2018-11-02 DIAGNOSIS — Z046 Encounter for general psychiatric examination, requested by authority: Secondary | ICD-10-CM | POA: Insufficient documentation

## 2018-11-02 DIAGNOSIS — Z20828 Contact with and (suspected) exposure to other viral communicable diseases: Secondary | ICD-10-CM | POA: Insufficient documentation

## 2018-11-02 DIAGNOSIS — F333 Major depressive disorder, recurrent, severe with psychotic symptoms: Secondary | ICD-10-CM | POA: Insufficient documentation

## 2018-11-02 DIAGNOSIS — F17210 Nicotine dependence, cigarettes, uncomplicated: Secondary | ICD-10-CM | POA: Diagnosis not present

## 2018-11-02 DIAGNOSIS — F1510 Other stimulant abuse, uncomplicated: Secondary | ICD-10-CM | POA: Diagnosis present

## 2018-11-02 DIAGNOSIS — R45851 Suicidal ideations: Secondary | ICD-10-CM | POA: Diagnosis not present

## 2018-11-02 DIAGNOSIS — F1514 Other stimulant abuse with stimulant-induced mood disorder: Secondary | ICD-10-CM | POA: Diagnosis not present

## 2018-11-02 DIAGNOSIS — R441 Visual hallucinations: Secondary | ICD-10-CM | POA: Diagnosis present

## 2018-11-02 DIAGNOSIS — R443 Hallucinations, unspecified: Secondary | ICD-10-CM

## 2018-11-02 DIAGNOSIS — F332 Major depressive disorder, recurrent severe without psychotic features: Secondary | ICD-10-CM

## 2018-11-02 DIAGNOSIS — F19959 Other psychoactive substance use, unspecified with psychoactive substance-induced psychotic disorder, unspecified: Secondary | ICD-10-CM | POA: Diagnosis present

## 2018-11-02 LAB — BASIC METABOLIC PANEL
Anion gap: 10 (ref 5–15)
BUN: 15 mg/dL (ref 6–20)
CO2: 23 mmol/L (ref 22–32)
Calcium: 8.7 mg/dL — ABNORMAL LOW (ref 8.9–10.3)
Chloride: 105 mmol/L (ref 98–111)
Creatinine, Ser: 0.58 mg/dL (ref 0.44–1.00)
GFR calc Af Amer: >60 mL/min (ref >60)
GFR calc non Af Amer: >60 mL/min (ref >60)
Glucose, Bld: 91 mg/dL (ref 70–99)
Potassium: 3.4 mmol/L — ABNORMAL LOW (ref 3.5–5.1)
Sodium: 138 mmol/L (ref 135–145)

## 2018-11-02 LAB — I-STAT BETA HCG BLOOD, ED (MC, WL, AP ONLY): I-stat hCG, quantitative: <5 m[IU]/mL (ref <5)

## 2018-11-02 LAB — CBC WITH DIFFERENTIAL/PLATELET
Abs Immature Granulocytes: 0.03 10*3/uL (ref 0.00–0.07)
Basophils Absolute: 0 10*3/uL (ref 0.0–0.1)
Basophils Relative: 0 %
Eosinophils Absolute: 0 10*3/uL (ref 0.0–0.5)
Eosinophils Relative: 0 %
HCT: 38.5 % (ref 36.0–46.0)
Hemoglobin: 12.9 g/dL (ref 12.0–15.0)
Immature Granulocytes: 0 %
Lymphocytes Relative: 20 %
Lymphs Abs: 2.2 10*3/uL (ref 0.7–4.0)
MCH: 32.4 pg (ref 26.0–34.0)
MCHC: 33.5 g/dL (ref 30.0–36.0)
MCV: 96.7 fL (ref 80.0–100.0)
Monocytes Absolute: 0.8 10*3/uL (ref 0.1–1.0)
Monocytes Relative: 8 %
Neutro Abs: 7.6 10*3/uL (ref 1.7–7.7)
Neutrophils Relative %: 72 %
Platelets: 354 10*3/uL (ref 150–400)
RBC: 3.98 MIL/uL (ref 3.87–5.11)
RDW: 11.9 % (ref 11.5–15.5)
WBC: 10.6 10*3/uL — ABNORMAL HIGH (ref 4.0–10.5)
nRBC: 0 % (ref 0.0–0.2)

## 2018-11-02 LAB — SARS CORONAVIRUS 2 BY RT PCR (HOSPITAL ORDER, PERFORMED IN ~~LOC~~ HOSPITAL LAB): SARS Coronavirus 2: NEGATIVE

## 2018-11-02 LAB — ETHANOL: Alcohol, Ethyl (B): <10 mg/dL (ref <10)

## 2018-11-02 NOTE — ED Provider Notes (Signed)
[facility]-EMERGENCY DEPT Provider Note   CSN: [id number] Arrival date & time: 11/02/18  1406    History   Chief Complaint Chief Complaint  Patient presents with  . Psychiatric Evaluation    HPI Deborah Park is a 33 y.o. female.     Patient is a 33 year old female who presents with IVC papers for psychiatric evaluation.  She has a history of depression and prior substance abuse.  Per the IVC papers which were initiated by her mother, she is recently lost her home and lost guardianship of her 1613-month-old child.  She has been seeing things at home that are not there and thinks that she is being watched.  She has had some incoherent speech and screaming fits.  The patient states that her mom is lying in that these things are actually happening and that it is her mom is actually crazy.  She denies any hallucinations.  She denies any suicidal or homicidal ideations.  She denies any drug or alcohol use.  She denies any recent physical illnesses or current complaints.     Past Medical History:  Diagnosis Date  . Anxiety   . Chiari malformation type II (HCC)   . Memory loss 09/17/2013  . Sleep disturbance 09/17/2013    Patient Active Problem List   Diagnosis Date Noted  . MDD (major depressive disorder), single episode, severe with psychosis (HCC) 08/29/2018  . Insufficient prenatal care 02/14/2018  . Preterm contractions 02/14/2018  . Polysubstance abuse (HCC) 02/14/2018  . Preterm delivery 02/14/2018  . Preterm premature rupture of membranes (PPROM) with unknown onset of labor 02/08/2018  . Premature rupture of membranes 02/08/2018  . Short cervix 12/14/2017  . Short cervix during pregnancy in second trimester 12/13/2017  . Memory loss 09/17/2013  . Sleep disturbance 09/17/2013    Past Surgical History:  Procedure Laterality Date  . KIDNEY SURGERY       OB History    Gravida  2   Para  1   Term  0   Preterm  1   AB  0   Living  1     SAB  0   TAB      Ectopic      Multiple  1   Live Births  1            Home Medications    Prior to Admission medications   Not on File    Family History Family History  Problem Relation Age of Onset  . Healthy Mother   . Healthy Father   . Bipolar disorder Maternal Grandmother   . Seizures Neg Hx   . Parkinsonism Neg Hx   . Multiple sclerosis Neg Hx   . Dementia Neg Hx     Social History Social History   Tobacco Use  . Smoking status: Current Every Day Smoker    Packs/day: 0.50    Years: 15.00    Pack years: 7.50    Types: Cigarettes  . Smokeless tobacco: Never Used  Substance Use Topics  . Alcohol use: Not Currently  . Drug use: Yes    Types: Amphetamines, Cocaine    Comment: last report 08 Feb 2018. pt reports none in preg     Allergies   Patient has no known allergies.   Review of Systems Review of Systems  Constitutional: Negative for chills, diaphoresis, fatigue and fever.  HENT: Negative for congestion, rhinorrhea and sneezing.   Eyes: Negative.   Respiratory: Negative for  cough, chest tightness and shortness of breath.   Cardiovascular: Negative for chest pain and leg swelling.  Gastrointestinal: Negative for abdominal pain, blood in stool, diarrhea, nausea and vomiting.  Genitourinary: Negative for difficulty urinating, flank pain, frequency and hematuria.  Musculoskeletal: Negative for arthralgias and back pain.  Skin: Negative for rash.  Neurological: Negative for dizziness, speech difficulty, weakness, numbness and headaches.  Psychiatric/Behavioral: The patient is nervous/anxious.      Physical Exam Updated Vital Signs BP (!) 156/98 (BP Location: Left Arm)   Pulse (!) 118   Temp 99 F (37.2 C) (Oral)   Resp 20   Ht 5\' 3"  (1.6 m)   Wt 45.4 kg   LMP 10/12/2018   SpO2 100%   BMI 17.71 kg/m   Physical Exam Constitutional:      Appearance: She is well-developed.  HENT:     Head: Normocephalic and atraumatic.  Eyes:      Pupils: Pupils are equal, round, and reactive to light.  Neck:     Musculoskeletal: Normal range of motion and neck supple.  Cardiovascular:     Rate and Rhythm: Normal rate and regular rhythm.     Heart sounds: Normal heart sounds.  Pulmonary:     Effort: Pulmonary effort is normal. No respiratory distress.     Breath sounds: Normal breath sounds. No wheezing or rales.  Chest:     Chest wall: No tenderness.  Abdominal:     General: Bowel sounds are normal.     Palpations: Abdomen is soft.     Tenderness: There is no abdominal tenderness. There is no guarding or rebound.  Musculoskeletal: Normal range of motion.  Lymphadenopathy:     Cervical: No cervical adenopathy.  Skin:    General: Skin is warm and dry.     Findings: No rash.  Neurological:     Mental Status: She is alert and oriented to person, place, and time.      ED Treatments / Results  Labs (all labs ordered are listed, but only abnormal results are displayed) Labs Reviewed  BASIC METABOLIC PANEL - Abnormal; Notable for the following components:      Result Value   Potassium 3.4 (*)    Calcium 8.7 (*)    All other components within normal limits  CBC WITH DIFFERENTIAL/PLATELET - Abnormal; Notable for the following components:   WBC 10.6 (*)    All other components within normal limits  SARS CORONAVIRUS 2 (HOSPITAL ORDER, [facility])  ETHANOL  RAPID URINE DRUG SCREEN, HOSP PERFORMED  I-STAT BETA HCG BLOOD, ED ([facility], [facility], [facility] ONLY)    EKG None  Radiology No results found.  Procedures Procedures (including critical care time)  Medications Ordered in ED Medications - No data to display   Initial Impression / Assessment and Plan / ED Course  I have reviewed the triage vital signs and the nursing notes.  Pertinent labs & imaging results that were available during my care of the patient were reviewed by me and considered in my medical decision making (see chart for details).         Patient has been medically cleared and is awaiting TTS evaluation.  Final Clinical Impressions(s) / ED Diagnoses   Final diagnoses:  Hallucinations    ED Discharge Orders    None       Malvin Johns, MD 11/02/18 [id number]

## 2018-11-02 NOTE — ED Notes (Signed)
Pt has been seen and wand by security.   Pt has 1 bag of belongings. 
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
## 2018-11-02 NOTE — ED Notes (Signed)
Pt moved to room 25 from triage 2 along with belongings and secured in cabinets at nursing station for beds 23-25.
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
## 2018-11-03 NOTE — ED Notes (Signed)
Called report to TCU.

## 2018-11-03 NOTE — ED Notes (Addendum)
Security wanded pt. The pt said she wasn't going to talk because it only makes things worse and she stays longer. Deborah Park she wants an Forensic psychologist. I walked her back to room 32 in TCU. Placed her plastic bag of belongings in locker 34.

## 2018-11-03 NOTE — ED Notes (Addendum)
Patient tearful and expressing concern of missing scheduled court date tomorrow. States "my mother is trying to ruin my life."

## 2018-11-03 NOTE — ED Provider Notes (Signed)
Vitals:   11/03/18 0400 11/03/18 0645  BP: 101/69 99/70  Pulse: 61 70  Resp: 16 18  Temp:    SpO2: 97% 97%   Medically cleared.  Awaiting pscyh admission.   Dorie Rank, MD 11/03/18 0730

## 2018-11-03 NOTE — ED Notes (Signed)
Patient provided with meal tray. States "I don't want it, I'm not eating."

## 2018-11-03 NOTE — ED Notes (Signed)
Patient returned from shower. Tearful and cooperative at this time.

## 2018-11-03 NOTE — ED Notes (Signed)
Upon rounding on patient, patient tearful and requesting discharge. Patient upset and loudly speaking with staff. States "I am being lied to, the doctor said I was leaving today." Telesitter remains at beside.

## 2018-11-03 NOTE — Progress Notes (Signed)
CSW contacted by [facility] and notified that clinical assessments were not attached in referral packet. CSW faxed these over.   TTS will continue to seek bed placement.   Chalmers Guest. Guerry Bruin, MSW, [place] Work/Disposition Phone: [phone] Fax: [phone]

## 2018-11-03 NOTE — ED Notes (Signed)
Patient offered food and drinks. Declined at this time.

## 2018-11-03 NOTE — Progress Notes (Signed)
[facility]  11/03/2018 5:18 PM Debby FreibergSamantha A Eardley  MRN:  [id number] Subjective:  "I called the police because my mom was acting emotional and trying to take my kids away." Principal Problem: Agression Diagnosis: Anxiety Total Time spent with patient: 30 minutes  Deborah Park is an 33 y.o. female who was IVCd by Mother and transported [facility]. "I don't need to be here it is my mom's fault." The patient is teary eyes and  excusing mom of other incidences that has taken place. Per nursing notes the patient has been refusing her meals and snacks. The patient was seen face-to-face by this provider; chart reviewed and consulted with Dr. Jama Flavorsobos on 11/03/2018 due to the care of the patient. It was discussed with the provider that the patient does meet criteria to be admitted to the inpatient unit.  On evaluation the patient  is alert and oriented x4, calm and cooperative, emotional and mood-congruent with affect.  The patient does not appear to be responding to internal or external stimuli. She presenting with some paranoia thinking with no way of confirming it. The patient denies auditory or visual hallucinations. The patient denies any suicidal, homicidal, or self-harm ideations. The patient is not presenting with any psychotic behaviors. During an encounter with the patient, she was able to answer questions appropriately.  Plan: The patient is a safety is risk to self and does require psychiatric inpatient admission for stabilization and treatment. Due to her presenting with some allegation of people stealing her identity. Requesting retrieving her children from her mom who has full custody of them.    Past Psychiatric History:  Anxiety Substance  Past Medical History:  Past Medical History:  Diagnosis Date  . Anxiety   . Chiari malformation type II (HCC)   . Memory loss 09/17/2013  . Sleep disturbance 09/17/2013    Past Surgical History:  Procedure Laterality Date  .  KIDNEY SURGERY     Family History:  Family History  Problem Relation Age of Onset  . Healthy Mother   . Healthy Father   . Bipolar disorder Maternal Grandmother   . Seizures Neg Hx   . Parkinsonism Neg Hx   . Multiple sclerosis Neg Hx   . Dementia Neg Hx    Family Psychiatric  History:  Social History:  Social History   Substance and Sexual Activity  Alcohol Use Not Currently     Social History   Substance and Sexual Activity  Drug Use Yes  . Types: Amphetamines, Cocaine   Comment: last report 08 Feb 2018. pt reports none in preg    Social History   Socioeconomic History  . Marital status: Media plannerDomestic Partner    Spouse name: Not on file  . Number of children: 1  . Years of education: College  . Highest education level: Not on file  Occupational History    Employer: NOT EMPLOYED    Comment: not employed  Social Needs  . Financial resource strain: Not on file  . Food insecurity    Worry: Not on file    Inability: Not on file  . Transportation needs    Medical: Not on file    Non-medical: Not on file  Tobacco Use  . Smoking status: Current Every Day Smoker    Packs/day: 0.50    Years: 15.00    Pack years: 7.50    Types: Cigarettes  . Smokeless tobacco: Never Used  Substance and Sexual Activity  . Alcohol use: Not  Currently  . Drug use: Yes    Types: Amphetamines, Cocaine    Comment: last report 08 Feb 2018. pt reports none in preg  . Sexual activity: Not Currently    Birth control/protection: None  Lifestyle  . Physical activity    Days per week: Not on file    Minutes per session: Not on file  . Stress: Not on file  Relationships  . Social Musicianconnections    Talks on phone: Not on file    Gets together: Not on file    Attends religious service: Not on file    Active member of club or organization: Not on file    Attends meetings of clubs or organizations: Not on file    Relationship status: Not on file  Other Topics Concern  . Not on file  Social  History Narrative   Patient lives at home with son.   Caffeine use; 1-3 cups daily,is right handed   Additional Social History:      Name of Substance 1: Cocaine 1 - Last Use / Amount: 2019                  Sleep: Fair  Appetite:  Poor  Current Medications: No current facility-administered medications for this encounter.    No current outpatient medications on file.    Lab Results:  Results for orders placed or performed during the hospital encounter of 11/02/18 (from the past 48 hour(s))  SARS Coronavirus 2 [facility](Hospital order, [facility]) Nasopharyngeal Nasopharyngeal Swab     Status: None   Collection Time: 11/02/18  3:48 PM   Specimen: Nasopharyngeal Swab  Result Value Ref Range   SARS Coronavirus 2 NEGATIVE NEGATIVE    Comment: (NOTE) If result is NEGATIVE SARS-CoV-2 target nucleic acids are NOT DETECTED. The SARS-CoV-2 RNA is generally detectable in upper and lower  respiratory specimens during the acute phase of infection. The lowest  concentration of SARS-CoV-2 viral copies this assay can detect is 250  copies / mL. A negative result does not preclude SARS-CoV-2 infection  and should not be used as the sole basis for treatment or other  patient management decisions.  A negative result may occur with  improper specimen collection / handling, submission of specimen other  than nasopharyngeal swab, presence of viral mutation(s) within the  areas targeted by this assay, and inadequate number of viral copies  (<250 copies / mL). A negative result must be combined with clinical  observations, patient history, and epidemiological information. If result is POSITIVE SARS-CoV-2 target nucleic acids are DETECTED. The SARS-CoV-2 RNA is generally detectable in upper and lower  respiratory specimens dur ing the acute phase of infection.  Positive  results are indicative of active infection with SARS-CoV-2.  Clinical  correlation with patient  history and other diagnostic information is  necessary to determine patient infection status.  Positive results do  not rule out bacterial infection or co-infection with other viruses. If result is PRESUMPTIVE POSTIVE SARS-CoV-2 nucleic acids MAY BE PRESENT.   A presumptive positive result was obtained on the submitted specimen  and confirmed on repeat testing.  While 2019 novel coronavirus  (SARS-CoV-2) nucleic acids may be present in the submitted sample  additional confirmatory testing may be necessary for epidemiological  and / or clinical management purposes  to differentiate between  SARS-CoV-2 and other Sarbecovirus currently known to infect humans.  If clinically indicated additional testing with an alternate test  methodology [phone]([id number]) is advised.  The SARS-CoV-2 RNA is generally  detectable in upper and lower respiratory sp ecimens during the acute  phase of infection. The expected result is Negative. Fact Sheet for Patients:  StrictlyIdeas.no Fact Sheet for Healthcare Providers: BankingDealers.co.za This test is not yet approved or cleared by the [place] FDA and has been authorized for detection and/or diagnosis of SARS-CoV-2 by FDA under an Emergency Use Authorization (EUA).  This EUA will remain in effect (meaning this test can be used) for the duration of the COVID-19 declaration under Section 564(b)(1) of the Act, 21 U.S.C. section 360bbb-3(b)(1), unless the authorization is terminated or revoked sooner. Performed at [facility], [place] [address], [place], [place] [address]   Basic metabolic panel     Status: Abnormal   Collection Time: 11/02/18  4:30 PM  Result Value Ref Range   Sodium 138 135 - 145 mmol/L   Potassium 3.4 (L) 3.5 - 5.1 mmol/L   Chloride 105 98 - 111 mmol/L   CO2 23 22 - 32 mmol/L   Glucose, Bld 91 70 - 99 mg/dL   BUN 15 6 - 20 mg/dL   Creatinine, Ser 0.58 0.44 - 1.00 mg/dL    Calcium 8.7 (L) 8.9 - 10.3 mg/dL   GFR calc non Af Amer >60 >60 mL/min   GFR calc Af Amer >60 >60 mL/min   Anion gap 10 5 - 15    Comment: Performed at [facility], [place] [address], [place], [place] [address]  CBC with Differential     Status: Abnormal   Collection Time: 11/02/18  4:30 PM  Result Value Ref Range   WBC 10.6 (H) 4.0 - 10.5 K/uL   RBC 3.98 3.87 - 5.11 MIL/uL   Hemoglobin 12.9 12.0 - 15.0 g/dL   HCT 38.5 36.0 - 46.0 %   MCV 96.7 80.0 - 100.0 fL   MCH 32.4 26.0 - 34.0 pg   MCHC 33.5 30.0 - 36.0 g/dL   RDW 11.9 11.5 - 15.5 %   Platelets 354 150 - 400 K/uL   nRBC 0.0 0.0 - 0.2 %   Neutrophils Relative % 72 %   Neutro Abs 7.6 1.7 - 7.7 K/uL   Lymphocytes Relative 20 %   Lymphs Abs 2.2 0.7 - 4.0 K/uL   Monocytes Relative 8 %   Monocytes Absolute 0.8 0.1 - 1.0 K/uL   Eosinophils Relative 0 %   Eosinophils Absolute 0.0 0.0 - 0.5 K/uL   Basophils Relative 0 %   Basophils Absolute 0.0 0.0 - 0.1 K/uL   Immature Granulocytes 0 %   Abs Immature Granulocytes 0.03 0.00 - 0.07 K/uL    Comment: Performed at [facility], [place] [address] [place], [place] [address]  Ethanol     Status: None   Collection Time: 11/02/18  4:30 PM  Result Value Ref Range   Alcohol, Ethyl (B) <10 <10 mg/dL    Comment: (NOTE) Lowest detectable limit for serum alcohol is 10 mg/dL. For medical purposes only. Performed at [facility], [place] [address] [place], [place] [address]   I-Stat beta hCG blood, ED     Status: None   Collection Time: 11/02/18  4:40 PM  Result Value Ref Range   I-stat hCG, quantitative <5.0 <5 mIU/mL   Comment 3            Comment:   GEST. AGE      CONC.  (mIU/mL)   <=1 WEEK  5 - 50     2 WEEKS       50 - 500     3 WEEKS       100 - 10,000     4 WEEKS     1,000 - 30,000        FEMALE AND NON-PREGNANT FEMALE:     LESS THAN 5 mIU/mL     Blood Alcohol level:  Lab Results  Component Value Date   ETH <10  11/02/2018   [facility]  [facility]    <5        LOWEST DETECTABLE LIMIT FOR SERUM ALCOHOL IS 5 mg/dL FOR MEDICAL PURPOSES ONLY    Metabolic Disorder Labs: Lab Results  Component Value Date   HGBA1C 4.6 (L) 02/08/2018   MPG 85.32 02/08/2018   No results found for: PROLACTIN No results found for: CHOL, TRIG, HDL, CHOLHDL, VLDL, LDLCALC  Physical Findings: AIMS:  , ,  ,  ,    CIWA:    COWS:     Musculoskeletal: Strength & Muscle Tone: within normal limits Gait & Station: normal Patient leans: N/A  Psychiatric Specialty Exam: Physical Exam  Nursing note and vitals reviewed. Constitutional: She is oriented to person, place, and time. She appears well-developed and well-nourished.  HENT:  Head: Normocephalic.  Eyes: Pupils are equal, round, and reactive to light. Conjunctivae are normal.  Neck: Normal range of motion. Neck supple.  Cardiovascular: Normal rate.  Respiratory: Effort normal.  Musculoskeletal: Normal range of motion.  Neurological: She is alert and oriented to person, place, and time.  Skin: Skin is warm and dry.    Review of Systems  Psychiatric/Behavioral: Positive for depression and substance abuse. The patient is nervous/anxious.   All other systems reviewed and are negative.   Blood pressure 108/80, pulse 94, temperature 98.6 F (37 C), temperature source Oral, resp. rate 16, height 5\' 3"  (1.6 m), weight 45.4 kg, last menstrual period 10/12/2018, SpO2 100 %, unknown if currently breastfeeding.Body mass index is 17.71 kg/m.  General Appearance: Fairly Groomed  Eye Contact:  Minimal  Speech:  Clear and Coherent  Volume:  Increased  Mood:  Angry, Anxious, Depressed and Irritable  Affect:  Congruent, Depressed and Tearful  Thought Process:  Coherent  Orientation:  Full (Time, Place, and Person)  Thought Content:  Ideas of Reference:   Paranoia  Suicidal Thoughts:  No  Homicidal Thoughts:  No  Memory:  Immediate;   Fair Recent;   Fair  Judgement:  Fair   Insight:  Lacking  Psychomotor Activity:  Decreased  Concentration:  Concentration: Good and Attention Span: Good  Recall:  FiservFair  Fund of Knowledge:  Fair  Language:  Good  Akathisia:  Negative  Handed:  Right  AIMS (if indicated):     Assets:  Desire for Improvement Housing Social Support  ADL's:  Intact  Cognition:  WNL  Sleep:   Okay     Treatment Plan Summary: Daily contact with patient to assess and evaluate symptoms and progress in treatment and Plan The patient does meet carteria for psychartic inpatient admission.  Gillermo MurdochJacqueline , NP 11/03/2018, 5:18 PM

## 2018-11-03 NOTE — Progress Notes (Addendum)
       This patient continues to meet inpatient criteria. CSW faxed information to the following facilities:   [facility]  [facility]  [facility]  [facility]  [facility]  [facility] - No answer [facility]  - No available beds [place]   [facility] does not have appropriate bed availability at this time.  Stephanie Acre, [place] Social Worker [phone]

## 2018-11-03 NOTE — ED Notes (Addendum)
Patient escorted to shower with NT and security per patient request.

## 2018-11-04 ENCOUNTER — Encounter (HOSPITAL_COMMUNITY): Payer: Self-pay | Admitting: Registered Nurse

## 2018-11-04 DIAGNOSIS — F332 Major depressive disorder, recurrent severe without psychotic features: Secondary | ICD-10-CM

## 2018-11-04 DIAGNOSIS — F1510 Other stimulant abuse, uncomplicated: Secondary | ICD-10-CM | POA: Diagnosis present

## 2018-11-04 DIAGNOSIS — R443 Hallucinations, unspecified: Secondary | ICD-10-CM

## 2018-11-04 DIAGNOSIS — F19959 Other psychoactive substance use, unspecified with psychoactive substance-induced psychotic disorder, unspecified: Secondary | ICD-10-CM | POA: Diagnosis present

## 2018-11-04 DIAGNOSIS — F1514 Other stimulant abuse with stimulant-induced mood disorder: Secondary | ICD-10-CM

## 2018-11-04 LAB — RAPID URINE DRUG SCREEN, HOSP PERFORMED
Amphetamines: POSITIVE — AB
Barbiturates: NOT DETECTED
Benzodiazepines: NOT DETECTED
Cocaine: NOT DETECTED
Opiates: NOT DETECTED
Tetrahydrocannabinol: NOT DETECTED

## 2018-11-04 MED ORDER — OLANZAPINE 5 MG PO TABS
5.0000 mg | ORAL_TABLET | Freq: Every day | ORAL | Status: DC
  Filled 2018-11-04: qty 1

## 2018-11-04 MED ORDER — OLANZAPINE 10 MG IM SOLR: 10.0000 mg | Freq: Two times a day (BID) | INTRAMUSCULAR | Status: DC | PRN

## 2018-11-04 NOTE — BH Assessment (Signed)
[place] Assessment Progress Note  This pt presents under IVC initiated by pt's mother on 11/02/2018.  However, a First Examination was not completed within 24 hours of pt's arrival, and IVC has expired.  Per Buford Dresser, DO, this pt requires psychiatric hospitalization at this time.  She notes that pt meets criteria for IVC if necessary.  Brook McNichol, RN, [facility] reports that no 500 hall beds will be available today, but he intends to move pt to the Observation Unit later today.  Pt's nurse, Diane, has been notified.  Jalene Mullet, [place] Coordinator [phone]

## 2018-11-04 NOTE — ED Notes (Signed)
Pt appears to be delusional stating that if there are drugs in her blood someone put them there. She is angry believing that her mother committed her and has been irritable generally, but stays in bed.

## 2018-11-04 NOTE — ED Provider Notes (Signed)
  Physical Exam  BP 100/67 (BP Location: Right Arm)   Pulse 79   Temp 98.7 F (37.1 C) (Oral)   Resp 16   Ht 5\' 3"  (1.6 m)   Wt 45.4 kg   LMP 10/12/2018   SpO2 97%   BMI 17.71 kg/m   Physical Exam  ED Course/Procedures     Procedures  MDM  I was asked to fill out first paperwork on patient. Patient was IVC by mother per provider note. Patient apparently was seen by psychiatry earlier today who felt that patient meets criteria for psych admission.  Patient was very angry about that decision.  Per previous provider notes, patient apparently lost custody of her child and apparently tried to walk into traffic.  On exam, patient is very irritable and states that she wanted the police to review the footage about her walking into traffic.  She does not want to stay and I reviewed IVC process with her.  I filled out the first exam and IVC paperwork on patient. Patient is medically cleared    Drenda Freeze, MD 11/04/18 2016

## 2018-11-04 NOTE — Progress Notes (Signed)
Received Deborah Park asleep in her room in bed with the sitter at the bedside. No noted distress. She refused her Zyprexa PO last night. No behavioral problems and slept throughout the night.

## 2018-11-04 NOTE — Consult Note (Addendum)
[facility] Progress Note  11/04/2018 3:39 PM Debby FreibergSamantha A Nee  MRN:  [id number]    Patient presented to hospital under IVC by her mother.  Patient mother has custody of her children (739 month old and 35 yr old).  On admission patient reporting depressed mood and that someone stole her identity.  [facility]/29/[facility] with similar complaints that her cell phone had been cloned by her boyfriend.  Possible that patient is having delusional thoughts but no way of collaborating.   Subjective:  Debby FreibergSamantha A Schuermann, 33 y.o., female patient seen via tele psych by this provider, Dr. Sharma CovertNorman; and chart reviewed on 11/04/18.  On evaluation Arryn A Cogswell continues to report that she does not need to be in the hospital and that it is her mothers fault.  Patient states that she has no history of drug use.  States that she is in the hospital because "I called the polices asking for help to get my children back.  They wouldn't even do that.  My mother is trying to take my kids from me."   During evaluation Almeter A Mittelstadt is alert/oriented x 4; calm/cooperative; and mood is congruent with affect.  She does not appear to be responding to internal/external stimuli.  Possible delusional thinking related to the identity thief.  Patient UDS positive for amphetamines but denies substance use.  Patient also denies that mother has custody of her children stating that mother trying to take her children.  Patient continues to deny suicidal/self-harm/homicidal ideation, psychosis, and paranoia.     Principal Problem: Psychoactive substance-induced psychosis (HCC) Diagnosis:  Active Problems:   Amphetamine abuse (HCC)   MDD (major depressive disorder), recurrent severe, without psychosis (HCC)  Total Time spent with patient: 30 minutes  Past Psychiatric History: MDD, amphetamine misuse  Past Medical History:  Past Medical History:  Diagnosis Date  . Anxiety   . Chiari malformation  type II (HCC)   . Memory loss 09/17/2013  . Sleep disturbance 09/17/2013    Past Surgical History:  Procedure Laterality Date  . KIDNEY SURGERY     Family History:  Family History  Problem Relation Age of Onset  . Healthy Mother   . Healthy Father   . Bipolar disorder Maternal Grandmother   . Seizures Neg Hx   . Parkinsonism Neg Hx   . Multiple sclerosis Neg Hx   . Dementia Neg Hx    Family Psychiatric  History: See above list Social History:  Social History   Substance and Sexual Activity  Alcohol Use Not Currently     Social History   Substance and Sexual Activity  Drug Use Yes  . Types: Amphetamines, Cocaine   Comment: last report 08 Feb 2018. pt reports none in preg    Social History   Socioeconomic History  . Marital status: Media plannerDomestic Partner    Spouse name: Not on file  . Number of children: 1  . Years of education: College  . Highest education level: Not on file  Occupational History    Employer: NOT EMPLOYED    Comment: not employed  Social Needs  . Financial resource strain: Not on file  . Food insecurity    Worry: Not on file    Inability: Not on file  . Transportation needs    Medical: Not on file    Non-medical: Not on file  Tobacco Use  . Smoking status: Current Every Day Smoker    Packs/day: 0.50  Years: 15.00    Pack years: 7.50    Types: Cigarettes  . Smokeless tobacco: Never Used  Substance and Sexual Activity  . Alcohol use: Not Currently  . Drug use: Yes    Types: Amphetamines, Cocaine    Comment: last report 08 Feb 2018. pt reports none in preg  . Sexual activity: Not Currently    Birth control/protection: None  Lifestyle  . Physical activity    Days per week: Not on file    Minutes per session: Not on file  . Stress: Not on file  Relationships  . Social Musician on phone: Not on file    Gets together: Not on file    Attends religious service: Not on file    Active member of club or organization: Not on file     Attends meetings of clubs or organizations: Not on file    Relationship status: Not on file  Other Topics Concern  . Not on file  Social History Narrative   Patient lives at home with son.   Caffeine use; 1-3 cups daily,is right handed    Sleep: Good  Appetite:  Good  Current Medications: No current facility-administered medications for this encounter.    No current outpatient medications on file.    Lab Results:  Results for orders placed or performed during the hospital encounter of 11/02/18 (from the past 48 hour(s))  Urine rapid drug screen (hosp performed)     Status: Abnormal   Collection Time: 11/02/18  3:47 PM  Result Value Ref Range   Opiates NONE DETECTED NONE DETECTED   Cocaine NONE DETECTED NONE DETECTED   Benzodiazepines NONE DETECTED NONE DETECTED   Amphetamines POSITIVE (A) NONE DETECTED   Tetrahydrocannabinol NONE DETECTED NONE DETECTED   Barbiturates NONE DETECTED NONE DETECTED    Comment: (NOTE) DRUG SCREEN FOR MEDICAL PURPOSES ONLY.  IF CONFIRMATION IS NEEDED FOR ANY PURPOSE, NOTIFY LAB WITHIN 5 DAYS. LOWEST DETECTABLE LIMITS FOR URINE DRUG SCREEN Drug Class                     Cutoff (ng/mL) Amphetamine and metabolites    1000 Barbiturate and metabolites    200 Benzodiazepine                 200 Tricyclics and metabolites     300 Opiates and metabolites        300 Cocaine and metabolites        300 THC                            50 Performed at [facility], [address], [place], [place] [address]   SARS Coronavirus 2 [facility] order, Performed in [facility] hospital lab) Nasopharyngeal Nasopharyngeal Swab     Status: None   Collection Time: 11/02/18  3:48 PM   Specimen: Nasopharyngeal Swab  Result Value Ref Range   SARS Coronavirus 2 NEGATIVE NEGATIVE    Comment: (NOTE) If result is NEGATIVE SARS-CoV-2 target nucleic acids are NOT DETECTED. The SARS-CoV-2 RNA is generally detectable in upper and lower  respiratory  specimens during the acute phase of infection. The lowest  concentration of SARS-CoV-2 viral copies this assay can detect is 250  copies / mL. A negative result does not preclude SARS-CoV-2 infection  and should not be used as the sole basis for treatment or other  patient management decisions.  A negative  result may occur with  improper specimen collection / handling, submission of specimen other  than nasopharyngeal swab, presence of viral mutation(s) within the  areas targeted by this assay, and inadequate number of viral copies  (<250 copies / mL). A negative result must be combined with clinical  observations, patient history, and epidemiological information. If result is POSITIVE SARS-CoV-2 target nucleic acids are DETECTED. The SARS-CoV-2 RNA is generally detectable in upper and lower  respiratory specimens dur ing the acute phase of infection.  Positive  results are indicative of active infection with SARS-CoV-2.  Clinical  correlation with patient history and other diagnostic information is  necessary to determine patient infection status.  Positive results do  not rule out bacterial infection or co-infection with other viruses. If result is PRESUMPTIVE POSTIVE SARS-CoV-2 nucleic acids MAY BE PRESENT.   A presumptive positive result was obtained on the submitted specimen  and confirmed on repeat testing.  While 2019 novel coronavirus  (SARS-CoV-2) nucleic acids may be present in the submitted sample  additional confirmatory testing may be necessary for epidemiological  and / or clinical management purposes  to differentiate between  SARS-CoV-2 and other Sarbecovirus currently known to infect humans.  If clinically indicated additional testing with an alternate test  methodology [phone]([id number]) is advised. The SARS-CoV-2 RNA is generally  detectable in upper and lower respiratory sp ecimens during the acute  phase of infection. The expected result is Negative. Fact Sheet for  Patients:  BoilerBrush.com.cyhttps://www.fda.gov/media/136312/download Fact Sheet for Healthcare Providers: https://pope.com/https://www.fda.gov/media/136313/download This test is not yet approved or cleared by the [place] FDA and has been authorized for detection and/or diagnosis of SARS-CoV-2 by FDA under an Emergency Use Authorization (EUA).  This EUA will remain in effect (meaning this test can be used) for the duration of the COVID-19 declaration under Section 564(b)(1) of the Act, 21 U.S.C. section 360bbb-3(b)(1), unless the authorization is terminated or revoked sooner. Performed at [facility], [address] [place], [place] [address]   Basic metabolic panel     Status: Abnormal   Collection Time: 11/02/18  4:30 PM  Result Value Ref Range   Sodium 138 135 - 145 mmol/L   Potassium 3.4 (L) 3.5 - 5.1 mmol/L   Chloride 105 98 - 111 mmol/L   CO2 23 22 - 32 mmol/L   Glucose, Bld 91 70 - 99 mg/dL   BUN 15 6 - 20 mg/dL   Creatinine, Ser 8.110.58 0.44 - 1.00 mg/dL   Calcium 8.7 (L) 8.9 - 10.3 mg/dL   GFR calc non Af Amer >60 >60 mL/min   GFR calc Af Amer >60 >60 mL/min   Anion gap 10 5 - 15    Comment: Performed at [facility], [address] [place], [place] [address]  CBC with Differential     Status: Abnormal   Collection Time: 11/02/18  4:30 PM  Result Value Ref Range   WBC 10.6 (H) 4.0 - 10.5 K/uL   RBC 3.98 3.87 - 5.11 MIL/uL   Hemoglobin 12.9 12.0 - 15.0 g/dL   HCT 29.538.5 62.136.0 - 30.846.0 %   MCV 96.7 80.0 - 100.0 fL   MCH 32.4 26.0 - 34.0 pg   MCHC 33.5 30.0 - 36.0 g/dL   RDW 65.711.9 84.611.5 - 96.215.5 %   Platelets 354 150 - 400 K/uL   nRBC 0.0 0.0 - 0.2 %   Neutrophils Relative % 72 %   Neutro Abs 7.6 1.7 - 7.7 K/uL  Lymphocytes Relative 20 %   Lymphs Abs 2.2 0.7 - 4.0 K/uL   Monocytes Relative 8 %   Monocytes Absolute 0.8 0.1 - 1.0 K/uL   Eosinophils Relative 0 %   Eosinophils Absolute 0.0 0.0 - 0.5 K/uL   Basophils Relative 0 %   Basophils Absolute 0.0 0.0 - 0.1  K/uL   Immature Granulocytes 0 %   Abs Immature Granulocytes 0.03 0.00 - 0.07 K/uL    Comment: Performed at [facility], [place] [address], [place], [place] [address]  Ethanol     Status: None   Collection Time: 11/02/18  4:30 PM  Result Value Ref Range   Alcohol, Ethyl (B) <10 <10 mg/dL    Comment: (NOTE) Lowest detectable limit for serum alcohol is 10 mg/dL. For medical purposes only. Performed at [facility], [place] [address], [place], [place] [address]   I-Stat beta hCG blood, ED     Status: None   Collection Time: 11/02/18  4:40 PM  Result Value Ref Range   I-stat hCG, quantitative <5.0 <5 mIU/mL   Comment 3            Comment:   GEST. AGE      CONC.  (mIU/mL)   <=1 WEEK        5 - 50     2 WEEKS       50 - 500     3 WEEKS       100 - 10,000     4 WEEKS     1,000 - 30,000        FEMALE AND NON-PREGNANT FEMALE:     LESS THAN 5 mIU/mL     Blood Alcohol level:  Lab Results  Component Value Date   ETH <10 11/02/2018   [facility]  12/01/2008    <5        LOWEST DETECTABLE LIMIT FOR SERUM ALCOHOL IS 5 mg/dL FOR MEDICAL PURPOSES ONLY    Physical Findings: AIMS:  , ,  ,  ,    CIWA:    COWS:     Musculoskeletal: Strength & Muscle Tone: within normal limits Gait & Station: normal Patient leans: N/A  Psychiatric Specialty Exam: Physical Exam  Nursing note and vitals reviewed. Constitutional: She is oriented to person, place, and time. She appears well-developed and well-nourished.  HENT:  Head: Normocephalic and atraumatic.  Neck: Normal range of motion.  Respiratory: Effort normal.  Musculoskeletal: Normal range of motion.  Neurological: She is alert and oriented to person, place, and time.  Psychiatric: Her speech is normal and behavior is normal. Thought content normal. Her affect is blunt. Cognition and memory are normal. She expresses impulsivity.    Review of Systems  Psychiatric/Behavioral: Negative for hallucinations and  suicidal ideas.  All other systems reviewed and are negative.   Blood pressure (!) 95/47, pulse 99, temperature 98.5 F (36.9 C), temperature source Oral, resp. rate 16, height 5\' 3"  (1.6 m), weight 45.4 kg, last menstrual period 10/12/2018, SpO2 99 %, unknown if currently breastfeeding.Body mass index is 17.71 kg/m.  General Appearance: Casual  Eye Contact:  Good  Speech:  Clear and Coherent and Normal Rate  Volume:  Normal  Mood:  Angry, Depressed and Irritable  Affect:  Constricted  Thought Process:  Coherent  Orientation:  Full (Time, Place, and Person)  Thought Content:  Ideas of Reference:   Paranoia But denies delusions, hallucinations, and paranoia  Suicidal Thoughts:  No  Homicidal Thoughts:  No  Memory:  Immediate;   Fair Recent;   Fair Remote;   Fair  Judgement:  Fair  Insight:  Lacking  Psychomotor Activity:  Normal  Concentration:  Concentration: Good and Attention Span: Good  Recall:  FiservFair  Fund of Knowledge:  Fair  Language:  Good  Akathisia:  No  Handed:  Right  AIMS (if indicated):   N/A  Assets:  Communication Skills Housing Social Support  ADL's:  Intact  Cognition:  WNL  Sleep:   N/A      Treatment Plan Summary: Daily contact with patient to assess and evaluate symptoms and progress in treatment and Plan Inpatient psychiatric treatment.  Will contnue to see bed availability for psychiatric hospitalization  Shuvon Rankin, NP 11/04/2018, 3:39 PM   Patient seen by telemedicine for psychiatric evaluation, chart reviewed and case discussed with the physician extender and developed treatment plan. Reviewed the information documented and agree with the treatment plan.  Juanetta BeetsJacqueline Jaxon Mynhier, DO 11/04/18 6:07 PM

## 2018-11-05 DIAGNOSIS — F1514 Other stimulant abuse with stimulant-induced mood disorder: Secondary | ICD-10-CM | POA: Diagnosis not present

## 2018-11-05 DIAGNOSIS — F332 Major depressive disorder, recurrent severe without psychotic features: Secondary | ICD-10-CM | POA: Diagnosis not present

## 2018-11-05 NOTE — ED Notes (Signed)
Pt discharged safely with resources.  All belongings were returned to pt.  Pt was unable to sign discharge due to unavailable pen pad.

## 2018-11-05 NOTE — Consult Note (Addendum)
[facility]  11/05/2018 3:50 PM Deborah Park  MRN:  [id number] Principal Problem: Psychoactive substance-induced psychosis [facility](HCC) Discharge Diagnoses: Principal Problem:   Psychoactive substance-induced psychosis (HCC) Active Problems:   Amphetamine abuse [facility](HCC)   MDD (major depressive disorder), recurrent severe, without psychosis [facility](HCC)    [facility]  11/05/2018 3:50 PM Deborah Park  MRN:  [id number]    Patient presented to hospital under IVC by her mother.  Patient mother has custody of her children (639 month old and 35 yr old).  On admission patient reporting depressed mood and that someone stole her identity.  [facility] with similar complaints that her cell phone had been cloned by her boyfriend.  Possible that patient is having delusional thoughts but no way of collaborating.   Subjective:  Deborah Park, 33 y.o., female patient seen via tele psych by this provider, Dr. Sharma CovertNorman; and chart reviewed on 11/05/18.  On evaluation Deborah Park reports that she is feeling better.  Patient states that CPS is involved with her youngest child and her mother has temporary custody but not custody of her 35 yr old.  "His father and I still have custody."  Patient states she has had ongoing issues with drug use.  Patient states that she and her mother got into an argument prior to her admission on the day that her mother had asked her to move in related to job loss.  Patient states that after argument she didn't know that the police were involved until they came.  "She was the one upset; don't know why she called the police."  Patient denies suicidal/self-harm/homicidal ideation, psychosis, and paranoia.  Patient has been calm and cooperative during hospital stay; yet a little irritable about being in hospital. Patient has refused medication; stating that she does not need any.  Patient does continue to minimize drug use  states that she is no longer using meth even after being told that UDS positive for amphetamines.  During assessment patient is alert/oriented x 4; her mood is congruent with affect.  She does not appear to be responding to internal/external stimuli. Possible delusional thinking related to phone but unable to determine related to possibility that she has lost her phone; unable to get another one.  Patient continues to deny  suicidal/self-harm/homicidal ideation, psychosis, and paranoia.  Patient states that she will be going back to her mothers house to stay.  Patient not interested in outpatient psychiatric services.  Will have peer support to give resources for substance use disorder services in case she changes her mind.    Principal Problem: Psychoactive substance-induced psychosis (HCC) Diagnosis:  Principal Problem:   Psychoactive substance-induced psychosis (HCC) Active Problems:   Amphetamine abuse (HCC)   MDD (major depressive disorder), recurrent severe, without psychosis (HCC)  Total Time spent with patient: 20 minutes  Past Psychiatric History: MDD, amphetamine misuse  Past Medical History:  Past Medical History:  Diagnosis Date  . Anxiety   . Chiari malformation type II (HCC)   . Memory loss 09/17/2013  . Sleep disturbance 09/17/2013    Past Surgical History:  Procedure Laterality Date  . KIDNEY SURGERY     Family History:  Family History  Problem Relation Age of Onset  . Healthy Mother   . Healthy Father   . Bipolar disorder Maternal Grandmother   . Seizures Neg Hx   . Parkinsonism Neg Hx   . Multiple sclerosis Neg Hx   .  Dementia Neg Hx    Family Psychiatric  History: See above list Social History:  Social History   Substance and Sexual Activity  Alcohol Use Not Currently     Social History   Substance and Sexual Activity  Drug Use Yes  . Types: Amphetamines, Cocaine   Comment: last report 08 Feb 2018. pt reports none in preg    Social History    Socioeconomic History  . Marital status: Media plannerDomestic Partner    Spouse name: Not on file  . Number of children: 1  . Years of education: College  . Highest education level: Not on file  Occupational History    Employer: NOT EMPLOYED    Comment: not employed  Social Needs  . Financial resource strain: Not on file  . Food insecurity    Worry: Not on file    Inability: Not on file  . Transportation needs    Medical: Not on file    Non-medical: Not on file  Tobacco Use  . Smoking status: Current Every Day Smoker    Packs/day: 0.50    Years: 15.00    Pack years: 7.50    Types: Cigarettes  . Smokeless tobacco: Never Used  Substance and Sexual Activity  . Alcohol use: Not Currently  . Drug use: Yes    Types: Amphetamines, Cocaine    Comment: last report 08 Feb 2018. pt reports none in preg  . Sexual activity: Not Currently    Birth control/protection: None  Lifestyle  . Physical activity    Days per week: Not on file    Minutes per session: Not on file  . Stress: Not on file  Relationships  . Social Musicianconnections    Talks on phone: Not on file    Gets together: Not on file    Attends religious service: Not on file    Active member of club or organization: Not on file    Attends meetings of clubs or organizations: Not on file    Relationship status: Not on file  Other Topics Concern  . Not on file  Social History Narrative   Patient lives at home with son.   Caffeine use; 1-3 cups daily,is right handed    Sleep: Good  Appetite:  Good  Current Medications: Current Facility-Administered Medications  Medication Dose Route Frequency Provider Last Rate Last Dose  . OLANZapine (ZYPREXA) injection 10 mg  10 mg Intramuscular BID PRN Cherly Beachorman, Antara Brecheisen J, DO      . OLANZapine (ZYPREXA) tablet 5 mg  5 mg Oral QHS Cherly BeachNorman, Fayth Trefry J, DO       No current outpatient medications on file.    Lab Results:  No results found for this or any previous visit (from the past 48  hour(s)).  Blood Alcohol level:  Lab Results  Component Value Date   ETH <10 11/02/2018   [facility]    <5        LOWEST DETECTABLE LIMIT FOR SERUM ALCOHOL IS 5 mg/dL FOR MEDICAL PURPOSES ONLY    Physical Findings: AIMS:  , ,  ,  ,   N/A CIWA:   N/A COWS:   N/A  Musculoskeletal: Strength & Muscle Tone: within normal limits Gait & Station: normal Patient leans: N/A  Total Time spent with patient: 30 minutes  Past Psychiatric History: MDD, amphetamine misuse   Past Medical History:  Past Medical History:  Diagnosis Date  . Anxiety   . Chiari malformation type II (HCC)   .  Memory loss 09/17/2013  . Sleep disturbance 09/17/2013    Past Surgical History:  Procedure Laterality Date  . KIDNEY SURGERY     Family History:  Family History  Problem Relation Age of Onset  . Healthy Mother   . Healthy Father   . Bipolar disorder Maternal Grandmother   . Seizures Neg Hx   . Parkinsonism Neg Hx   . Multiple sclerosis Neg Hx   . Dementia Neg Hx    Family Psychiatric  History: See above list Social History:  Social History   Substance and Sexual Activity  Alcohol Use Not Currently     Social History   Substance and Sexual Activity  Drug Use Yes  . Types: Amphetamines, Cocaine   Comment: last report 08 Feb 2018. pt reports none in preg    Social History   Socioeconomic History  . Marital status: Soil scientist    Spouse name: Not on file  . Number of children: 1  . Years of education: College  . Highest education level: Not on file  Occupational History    Employer: NOT EMPLOYED    Comment: not employed  Social Needs  . Financial resource strain: Not on file  . Food insecurity    Worry: Not on file    Inability: Not on file  . Transportation needs    Medical: Not on file    Non-medical: Not on file  Tobacco Use  . Smoking status: Current Every Day Smoker    Packs/day: 0.50    Years: 15.00    Pack years: 7.50    Types: Cigarettes  .  Smokeless tobacco: Never Used  Substance and Sexual Activity  . Alcohol use: Not Currently  . Drug use: Yes    Types: Amphetamines, Cocaine    Comment: last report 08 Feb 2018. pt reports none in preg  . Sexual activity: Not Currently    Birth control/protection: None  Lifestyle  . Physical activity    Days per week: Not on file    Minutes per session: Not on file  . Stress: Not on file  Relationships  . Social Herbalist on phone: Not on file    Gets together: Not on file    Attends religious service: Not on file    Active member of club or organization: Not on file    Attends meetings of clubs or organizations: Not on file    Relationship status: Not on file  Other Topics Concern  . Not on file  Social History Narrative   Patient lives at home with son.   Caffeine use; 1-3 cups daily,is right handed    Has this patient used any form of tobacco in the last 30 days? (Cigarettes, Smokeless Tobacco, Cigars, and/or Pipes) A prescription for an FDA-approved tobacco cessation medication was offered at discharge and the patient refused  Current Medications: Current Facility-Administered Medications  Medication Dose Route Frequency Provider Last Rate Last Dose  . OLANZapine (ZYPREXA) injection 10 mg  10 mg Intramuscular BID PRN Faythe Dingwall, DO      . OLANZapine (ZYPREXA) tablet 5 mg  5 mg Oral QHS Faythe Dingwall, DO       No current outpatient medications on file.   PTA Medications: (Not in a hospital admission)   Musculoskeletal: Strength & Muscle Tone: within normal limits Gait & Station: normal Patient leans: N/A  Psychiatric Specialty Exam: Physical Exam  Nursing note and vitals reviewed. Constitutional: She is oriented to  person, place, and time. She appears well-developed and well-nourished. No distress.  HENT:  Head: Normocephalic and atraumatic.  Neck: Normal range of motion.  Respiratory: Effort normal.  Musculoskeletal: Normal range of  motion.  Neurological: She is alert and oriented to person, place, and time.  Psychiatric: Her speech is normal and behavior is normal. Her mood appears anxious. Thought content is not paranoid and not delusional. Cognition and memory are normal. She expresses impulsivity. She expresses no homicidal and no suicidal ideation.    Review of Systems  Psychiatric/Behavioral: Depression: Stable. Hallucinations: Denies. Memory loss: Denies. Substance abuse: Denies; but UDS positive for Amphetamines. Suicidal ideas: Denies. Nervous/anxious: Stable. Insomnia: Denies.   All other systems reviewed and are negative.   Blood pressure (!) 93/56, pulse 72, temperature 98.9 F (37.2 C), temperature source Oral, resp. rate 18, height 5\' 3"  (1.6 m), weight 45.4 kg, last menstrual period 10/12/2018, SpO2 98 %, unknown if currently breastfeeding.Body mass index is 17.71 kg/m.  General Appearance: Casual  Eye Contact:  Good  Speech:  Clear and Coherent and Normal Rate  Volume:  Normal  Mood:  Anxious and Appropriate  Affect:  Appropriate and Congruent  Thought Process:  Coherent, Goal Directed and Descriptions of Associations: Intact  Orientation:  Full (Time, Place, and Person)  Thought Content:  WDL Denies hallucinations, delusions, and paranoia  Suicidal Thoughts:  No  Homicidal Thoughts:  No  Memory:  Immediate;   Good Recent;   Good Remote;   Good  Judgement:  Intact  Insight:  Present  Reporting she wants to get her life together; get a job and get her children back  Psychomotor Activity:  Normal  Concentration:  Concentration: Good and Attention Span: Good  Recall:  Good  Fund of Knowledge:  Good  Language:  Good  Akathisia:  No  Handed:  Right  AIMS (if indicated):   N/A  Assets:  Communication Skills Desire for Improvement Intimacy Social Support  ADL's:  Intact  Cognition:  WNL  Sleep:   N/A     Demographic Factors:  Caucasian  Loss Factors: Financial problems/change in  socioeconomic status  Historical Factors: Impulsivity  Risk Reduction Factors:   Responsible for children under 35 years of age, Sense of responsibility to family, Living with another person, especially a relative and Positive social support  Continued Clinical Symptoms:  Alcohol/Substance Abuse/Dependencies Previous Psychiatric Diagnoses and Treatments  Cognitive Features That Contribute To Risk:  None    Suicide Risk:  Minimal: No identifiable suicidal ideation.  Patients presenting with no risk factors but with morbid ruminations; may be classified as minimal risk based on the severity of the depressive symptoms    Plan Of Care/Follow-up recommendations:  Activity:  As tolerated Diet:  Heart healthy Other:  Follow up with resources given  Disposition: No evidence of imminent risk to self or others at present.   Patient does not meet criteria for psychiatric inpatient admission. Supportive therapy provided about ongoing stressors. Discussed crisis plan, support from social network, calling 911, coming to the Emergency Department, and calling Suicide Hotline.   Shuvon Rankin, NP 11/05/2018, 3:50 PM   Patient seen by telemedicine for psychiatric evaluation, chart reviewed and case discussed with the physician extender and developed treatment plan. Reviewed the information documented and agree with the treatment plan.  Juanetta BeetsJacqueline Evangela Heffler, DO 11/05/18 6:04 PM

## 2018-11-05 NOTE — Discharge Instructions (Signed)
For your mental health needs, you are advised to follow up with [facility].  Call them at your earliest opportunity to schedule an intake appointment: ° °     [facility] °     [address] °     [place], [place] [address] °     [phone] °     Crisis number: [phone] °

## 2018-11-05 NOTE — BH Assessment (Signed)
[facility] Assessment Progress Note  Per Buford Dresser, DO, this pt does not require psychiatric hospitalization at this time.  Pt presents under IVC initiated by EDP Shirlyn Goltz, MD, which Dr Mariea Clonts has rescinded.  Pt is to be discharged from [facility] with recommendation to follow up with [facility].  This has been included in pt's discharge instructions.  Pt's nurse has been notified.  Jalene Mullet, [place] Triage Specialist [phone]

## 2018-11-11 NOTE — ED Notes (Addendum)
Pt requesting discharge paperwork printed. AVS for 8/1 visit printed and given to pt (ID confirmed) 8/10 0716.

## 2019-04-30 ENCOUNTER — Other Ambulatory Visit: Payer: Self-pay

## 2019-04-30 ENCOUNTER — Ambulatory Visit (INDEPENDENT_AMBULATORY_CARE_PROVIDER_SITE_OTHER): Payer: Medicaid Other

## 2019-04-30 ENCOUNTER — Encounter: Payer: Self-pay | Admitting: Obstetrics

## 2019-04-30 DIAGNOSIS — Z3200 Encounter for pregnancy test, result unknown: Secondary | ICD-10-CM

## 2019-04-30 DIAGNOSIS — Z3201 Encounter for pregnancy test, result positive: Secondary | ICD-10-CM | POA: Diagnosis not present

## 2019-04-30 LAB — POCT URINE PREGNANCY: Preg Test, Ur: POSITIVE — AB

## 2019-04-30 NOTE — Progress Notes (Signed)
..   Ms. Studer presents today for UPT. She has no unusual complaints. LMP:01-11-19    OBJECTIVE: Appears well, in no apparent distress.  OB History    Gravida  4   Para  3   Term  1   Preterm  2   AB  0   Living  2     SAB  0   TAB      Ectopic      Multiple  0   Live Births  2          Home UPT Result:Positive In-Office UPT result:Positive I have reviewed the patient's medical, obstetrical, social, and family histories, and medications.   ASSESSMENT: Positive pregnancy test  PLAN Prenatal care to be completed at: [facility]

## 2019-05-01 NOTE — Progress Notes (Signed)
I have reviewed this chart and agree with the RN/CMA assessment and management.    K. Meryl Latoshia Monrroy, M.D. Attending [facility] (Faculty Practice)   

## 2019-05-08 ENCOUNTER — Ambulatory Visit (INDEPENDENT_AMBULATORY_CARE_PROVIDER_SITE_OTHER): Payer: Medicaid Other | Admitting: Obstetrics & Gynecology

## 2019-05-08 ENCOUNTER — Other Ambulatory Visit: Payer: Self-pay

## 2019-05-08 ENCOUNTER — Other Ambulatory Visit (HOSPITAL_COMMUNITY)
Admission: RE | Admit: 2019-05-08 | Discharge: 2019-05-08 | Disposition: A | Discharge status: Home / Self Care | Payer: Medicaid Other | Source: Ambulatory Visit | Attending: Obstetrics & Gynecology | Admitting: Obstetrics & Gynecology

## 2019-05-08 ENCOUNTER — Encounter: Payer: Self-pay | Admitting: Obstetrics & Gynecology

## 2019-05-08 VITALS — BP 101/63 | HR 91 | Wt 122.0 lb

## 2019-05-08 DIAGNOSIS — F1910 Other psychoactive substance abuse, uncomplicated: Secondary | ICD-10-CM

## 2019-05-08 DIAGNOSIS — O0990 Supervision of high risk pregnancy, unspecified, unspecified trimester: Secondary | ICD-10-CM | POA: Insufficient documentation

## 2019-05-08 DIAGNOSIS — O09899 Supervision of other high risk pregnancies, unspecified trimester: Secondary | ICD-10-CM

## 2019-05-08 DIAGNOSIS — Z3481 Encounter for supervision of other normal pregnancy, first trimester: Secondary | ICD-10-CM

## 2019-05-08 DIAGNOSIS — Z3A16 16 weeks gestation of pregnancy: Secondary | ICD-10-CM | POA: Diagnosis not present

## 2019-05-08 DIAGNOSIS — O09892 Supervision of other high risk pregnancies, second trimester: Secondary | ICD-10-CM

## 2019-05-08 DIAGNOSIS — O0992 Supervision of high risk pregnancy, unspecified, second trimester: Secondary | ICD-10-CM

## 2019-05-08 DIAGNOSIS — F323 Major depressive disorder, single episode, severe with psychotic features: Secondary | ICD-10-CM

## 2019-05-08 MED ORDER — PRENATE PIXIE 10-0.6-0.4-200 MG PO CAPS: 1.0000 | ORAL_CAPSULE | Freq: Every day | ORAL | 6 refills | Status: DC

## 2019-05-08 NOTE — Progress Notes (Signed)
  Subjective:    Deborah Park is a [id number] [redacted]w[redacted]d being seen today for her first obstetrical visit.  Her obstetrical history is significant for susbstance abuse, preterm birth, non compliance, psychiatric d/o. Patient does intend to breast feed. Pregnancy history fully reviewed.  Patient reports no complaints.  Vitals:   05/08/19 0850  BP: 101/63  Pulse: 91  Weight: 122 lb (55.3 kg)    HISTORY: OB History  Gravida Para Term Preterm AB Living  4 3 1 2  0 2  SAB TAB Ectopic Multiple Live Births  0     0 2    # Outcome Date GA Lbr Len/2nd Weight Sex Delivery Anes PTL Lv  4 Current           3 Preterm 02/14/18 [redacted]w[redacted]d  3 lb 7 oz (1.559 kg) M Vag-Spont  Y LIV  2 Term 04/22/07 [redacted]w[redacted]d   M Vag-Spont   LIV  1 Preterm 12/11/05 [redacted]w[redacted]d   M    FD   Past Medical History:  Diagnosis Date  . Anxiety   . Chiari malformation type II (HCC)   . Memory loss 09/17/2013  . Mental disorder   . Sleep disturbance 09/17/2013   Past Surgical History:  Procedure Laterality Date  . KIDNEY SURGERY     Family History  Problem Relation Age of Onset  . Healthy Mother   . Healthy Father   . Bipolar disorder Maternal Grandmother   . Seizures Neg Hx   . Parkinsonism Neg Hx   . Multiple sclerosis Neg Hx   . Dementia Neg Hx      Exam    Uterus:     Pelvic Exam:    Perineum: No Hemorrhoids   Vulva: normal   Vagina:  normal mucosa   pH:     Cervix: no lesions   Adnexa: normal adnexa   Bony Pelvis: average  System: Breast:  normal appearance, no masses or tenderness   Skin: normal coloration and turgor, no rashes    Neurologic: oriented, normal mood   Extremities: normal strength, tone, and muscle mass   HEENT PERRLA   Mouth/Teeth mucous membranes moist, pharynx normal without lesions and dental hygiene good   Neck supple   Cardiovascular: regular rate and rhythm   Respiratory:  appears well, vitals normal, no respiratory distress, acyanotic, normal RR, chest clear, no wheezing,  crepitations, rhonchi, normal symmetric air entry   Abdomen: soft, non-tender; bowel sounds normal; no masses,  no organomegaly   Urinary: urethral meatus normal      Assessment:    Pregnancy: 09/19/2013 Patient Active Problem List   Diagnosis Date Noted  . Supervision of high risk pregnancy, antepartum 05/08/2019  . History of preterm delivery, currently pregnant 05/08/2019  . 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  . MDD (major depressive disorder), recurrent severe, without psychosis (HCC) 11/04/2018  . Psychoactive substance-induced psychosis (HCC)   . MDD (major depressive disorder), single episode, severe with psychosis (HCC) 08/29/2018  . Polysubstance abuse (HCC) 02/14/2018  . Memory loss 09/17/2013  . Sleep disturbance 09/17/2013        Plan:     Initial labs drawn. Prenatal vitamins. Problem list reviewed and updated. Genetic Screening discussed [facility] and Panorama.  Ultrasound discussed; fetal survey: ordered.  Follow up in 4 weeks. 50% of 30 min visit spent on counseling and coordination of care.  [name] at 16 weeks, needs dating 09/19/2013 asap, appears 9-10 week on [place] today at bedside   US 05/08/2019

## 2019-05-08 NOTE — Progress Notes (Signed)
Pt states she is having some sharp abd pain that is occasional.  Pt states she is interested in [name] injections due to preterm history.  Pt states she may be moving to [facility] at some point in pregnancy.

## 2019-05-09 LAB — CERVICOVAGINAL ANCILLARY ONLY
Bacterial Vaginitis (gardnerella): POSITIVE — AB
Candida Glabrata: NEGATIVE
Candida Vaginitis: NEGATIVE
Chlamydia: NEGATIVE
Comment: NEGATIVE
Comment: NEGATIVE
Comment: NEGATIVE
Comment: NEGATIVE
Comment: NEGATIVE
Comment: NORMAL
Neisseria Gonorrhea: NEGATIVE
Trichomonas: NEGATIVE

## 2019-05-10 ENCOUNTER — Encounter: Payer: Self-pay | Admitting: Obstetrics and Gynecology

## 2019-05-10 DIAGNOSIS — O09529 Supervision of elderly multigravida, unspecified trimester: Secondary | ICD-10-CM | POA: Insufficient documentation

## 2019-05-10 DIAGNOSIS — Q0701 Arnold-Chiari syndrome with spina bifida: Secondary | ICD-10-CM | POA: Insufficient documentation

## 2019-05-10 LAB — OBSTETRIC PANEL, INCLUDING HIV
Antibody Screen: NEGATIVE
Basophils Absolute: 0 10*3/uL (ref 0.0–0.2)
Basos: 0 %
EOS (ABSOLUTE): 0.1 10*3/uL (ref 0.0–0.4)
Eos: 1 %
HIV Screen 4th Generation wRfx: NONREACTIVE
Hematocrit: 41.4 % (ref 34.0–46.6)
Hemoglobin: 14.1 g/dL (ref 11.1–15.9)
Hepatitis B Surface Ag: NEGATIVE
Immature Grans (Abs): 0 10*3/uL (ref 0.0–0.1)
Immature Granulocytes: 0 %
Lymphocytes Absolute: 2.2 10*3/uL (ref 0.7–3.1)
Lymphs: 21 %
MCH: 33.5 pg — ABNORMAL HIGH (ref 26.6–33.0)
MCHC: 34.1 g/dL (ref 31.5–35.7)
MCV: 98 fL — ABNORMAL HIGH (ref 79–97)
Monocytes Absolute: 0.8 10*3/uL (ref 0.1–0.9)
Monocytes: 7 %
Neutrophils Absolute: 7.4 10*3/uL — ABNORMAL HIGH (ref 1.4–7.0)
Neutrophils: 71 %
Platelets: 395 10*3/uL (ref 150–450)
RBC: 4.21 x10E6/uL (ref 3.77–5.28)
RDW: 11.8 % (ref 11.7–15.4)
RPR Ser Ql: NONREACTIVE
Rh Factor: POSITIVE
Rubella Antibodies, IGG: 0.92 index — ABNORMAL LOW (ref >0.99)
WBC: 10.5 10*3/uL (ref 3.4–10.8)

## 2019-05-10 LAB — CULTURE, OB URINE

## 2019-05-10 LAB — URINE CULTURE, OB REFLEX

## 2019-05-12 ENCOUNTER — Telehealth: Payer: Medicaid Other | Admitting: Licensed Clinical Social Worker

## 2019-05-12 ENCOUNTER — Other Ambulatory Visit: Payer: Self-pay | Admitting: Obstetrics & Gynecology

## 2019-05-12 DIAGNOSIS — O0990 Supervision of high risk pregnancy, unspecified, unspecified trimester: Secondary | ICD-10-CM

## 2019-05-12 LAB — CYTOLOGY - PAP
Comment: NEGATIVE
Diagnosis: NEGATIVE
High risk HPV: NEGATIVE

## 2019-05-12 MED ORDER — METRONIDAZOLE 500 MG PO TABS: 500.0000 mg | ORAL_TABLET | Freq: Two times a day (BID) | ORAL | 0 refills | Status: AC

## 2019-05-13 ENCOUNTER — Other Ambulatory Visit: Payer: Self-pay

## 2019-05-13 ENCOUNTER — Telehealth: Payer: Medicaid Other | Admitting: Licensed Clinical Social Worker

## 2019-05-13 ENCOUNTER — Other Ambulatory Visit (HOSPITAL_COMMUNITY): Payer: Self-pay | Admitting: *Deleted

## 2019-05-13 ENCOUNTER — Ambulatory Visit (HOSPITAL_COMMUNITY)
Admission: RE | Admit: 2019-05-13 | Discharge: 2019-05-13 | Disposition: A | Discharge status: Home / Self Care | Payer: Medicaid Other | Source: Ambulatory Visit | Attending: Obstetrics and Gynecology | Admitting: Obstetrics and Gynecology

## 2019-05-13 DIAGNOSIS — F1990 Other psychoactive substance use, unspecified, uncomplicated: Secondary | ICD-10-CM | POA: Diagnosis not present

## 2019-05-13 DIAGNOSIS — O99321 Drug use complicating pregnancy, first trimester: Secondary | ICD-10-CM | POA: Diagnosis not present

## 2019-05-13 DIAGNOSIS — Z3A10 10 weeks gestation of pregnancy: Secondary | ICD-10-CM

## 2019-05-13 DIAGNOSIS — O09899 Supervision of other high risk pregnancies, unspecified trimester: Secondary | ICD-10-CM | POA: Insufficient documentation

## 2019-05-13 DIAGNOSIS — O99331 Smoking (tobacco) complicating pregnancy, first trimester: Secondary | ICD-10-CM

## 2019-05-13 DIAGNOSIS — O09891 Supervision of other high risk pregnancies, first trimester: Secondary | ICD-10-CM

## 2019-05-13 DIAGNOSIS — O0990 Supervision of high risk pregnancy, unspecified, unspecified trimester: Secondary | ICD-10-CM

## 2019-05-13 DIAGNOSIS — O09529 Supervision of elderly multigravida, unspecified trimester: Secondary | ICD-10-CM

## 2019-05-13 DIAGNOSIS — O09211 Supervision of pregnancy with history of pre-term labor, first trimester: Secondary | ICD-10-CM

## 2019-05-13 DIAGNOSIS — O26841 Uterine size-date discrepancy, first trimester: Secondary | ICD-10-CM

## 2019-05-13 NOTE — Consult Note (Signed)
MFM Note  This patient was seen for a first trimester ultrasound today due to uncertain dates.  The patient has a significant history of multiple prior preterm births.  She reports that she delivered at 25 weeks in her first pregnancy.  Unfortunately, that baby did not survive after delivery.  She then had a 32-week preterm birth in her second pregnancy.  In her third pregnancy, she was advised to use weekly progesterone injections to prevent another preterm birth.  The patient reports that she did not go in for all of the progesterone injections and subsequently had a 28-week preterm birth.  She denies any significant past medical history and denies any problems in her current pregnancy.    She reports that she had a cell free DNA test drawn last week.  The results of that test was not available today.  On today's exam, the crown-rump length measured consistent with a fetus at 10 weeks and 3 days giving her an [facility] of December 06, 2019.  Based on a presumed LMP, she would have been 17+ weeks today.  Therefore, December 06, 2019 should be used as her final EDC.  To decrease her risk of another preterm birth, the patient should receive weekly progesterone (17 P, [name]) injections starting at around 16 weeks and continued weekly until 36 weeks.  The patient was advised that hopefully, with the weekly progesterone injections, her risk of having another preterm birth can be decreased.    We will also plan on following her with biweekly transvaginal cervical length measurements starting at around 16 weeks.  Should progressive cervical shortening be noted during these cervical length measurements, a cervical cerclage may be considered.    The patient understands that due to her history of 3 prior preterm births, that she is at increased risk for another preterm birth despite all medical interventions.  The patient stated that she understands this.  Hopefully, with close follow-up and surveillance, we can help  her obtain a successful pregnancy outcome for her current pregnancy.    A cervical length measurement was scheduled at 16 weeks and a fetal anatomy scan was scheduled at around 18 to 19 weeks.  A total of 30 minutes was spent counseling and coordinating the care for this patient.  Greater than 50% of the time was spent in direct face-to-face contact.

## 2019-05-15 ENCOUNTER — Encounter: Payer: Self-pay | Admitting: *Deleted

## 2019-05-15 NOTE — Progress Notes (Signed)
Letter for preg restrictions provided for pt today.

## 2019-05-16 ENCOUNTER — Ambulatory Visit (HOSPITAL_COMMUNITY): Payer: Medicaid Other

## 2019-05-26 ENCOUNTER — Encounter: Payer: Self-pay | Admitting: Obstetrics & Gynecology

## 2019-05-27 ENCOUNTER — Encounter: Payer: Self-pay | Admitting: Obstetrics & Gynecology

## 2019-05-27 ENCOUNTER — Telehealth: Payer: Self-pay

## 2019-05-27 NOTE — Telephone Encounter (Signed)
LVM with patient to let her know her Panorma results came back as "no results due to limitations of the test algorithm" and let her know she can have this redrawn. Left call back number.

## 2019-05-28 ENCOUNTER — Encounter: Payer: Self-pay | Admitting: Obstetrics and Gynecology

## 2019-05-28 ENCOUNTER — Other Ambulatory Visit: Payer: Self-pay | Admitting: Obstetrics and Gynecology

## 2019-05-28 DIAGNOSIS — Z1371 Encounter for nonprocreative screening for genetic disease carrier status: Secondary | ICD-10-CM

## 2019-06-03 ENCOUNTER — Other Ambulatory Visit: Payer: Medicaid Other

## 2019-06-03 ENCOUNTER — Other Ambulatory Visit: Payer: Self-pay

## 2019-06-05 ENCOUNTER — Telehealth (INDEPENDENT_AMBULATORY_CARE_PROVIDER_SITE_OTHER): Payer: Medicaid Other | Admitting: Obstetrics and Gynecology

## 2019-06-05 ENCOUNTER — Encounter: Payer: Self-pay | Admitting: Obstetrics and Gynecology

## 2019-06-05 DIAGNOSIS — Z3A13 13 weeks gestation of pregnancy: Secondary | ICD-10-CM

## 2019-06-05 DIAGNOSIS — O09899 Supervision of other high risk pregnancies, unspecified trimester: Secondary | ICD-10-CM

## 2019-06-05 DIAGNOSIS — O09522 Supervision of elderly multigravida, second trimester: Secondary | ICD-10-CM

## 2019-06-05 DIAGNOSIS — Q0701 Arnold-Chiari syndrome with spina bifida: Secondary | ICD-10-CM | POA: Diagnosis not present

## 2019-06-05 DIAGNOSIS — O09521 Supervision of elderly multigravida, first trimester: Secondary | ICD-10-CM | POA: Diagnosis not present

## 2019-06-05 DIAGNOSIS — O09891 Supervision of other high risk pregnancies, first trimester: Secondary | ICD-10-CM

## 2019-06-05 DIAGNOSIS — Z148 Genetic carrier of other disease: Secondary | ICD-10-CM | POA: Insufficient documentation

## 2019-06-05 DIAGNOSIS — O0991 Supervision of high risk pregnancy, unspecified, first trimester: Secondary | ICD-10-CM

## 2019-06-05 DIAGNOSIS — O0990 Supervision of high risk pregnancy, unspecified, unspecified trimester: Secondary | ICD-10-CM

## 2019-06-05 MED ORDER — BLOOD PRESSURE KIT DEVI: 1.0000 | 0 refills | Status: DC

## 2019-06-05 NOTE — Progress Notes (Signed)
TELEHEALTH OBSTETRICS PRENATAL VIRTUAL VIDEO VISIT ENCOUNTER NOTE  Provider location: [facility] [place]   I connected with Deborah Park on 06/05/19 at  3:30 PM EST by MyChart Video Encounter at home and verified that I am speaking with the correct person using two identifiers.   I discussed the limitations, risks, security and privacy concerns of performing an evaluation and management service virtually and the availability of in person appointments. I also discussed with the patient that there may be a patient responsible charge related to this service. The patient expressed understanding and agreed to proceed. Subjective:  Deborah Park is a 34 y.o. [id number] at [redacted]w[redacted]d being seen today for ongoing prenatal care.  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  Patient reports no complaints.  Contractions: Not present. Vag. Bleeding: None.   . Denies any leaking of fluid.   The following portions of the patient's history were reviewed and updated as appropriate: allergies, current medications, past family history, past medical history, past social history, past surgical history and problem list.   Objective:  There were no vitals filed for this visit.  Fetal Status:           General:  Alert, oriented and cooperative. Patient is in no acute distress.  Respiratory: Normal  respiratory effort, no problems with respiration noted  Mental Status: Normal mood and affect. Normal behavior. Normal judgment and thought content.  Rest of physical exam deferred due to type of encounter  Imaging: [place] MFM OB Comp Less 14 Wks  Result Date: 05/13/2019 ----------------------------------------------------------------------  OBSTETRICS REPORT                       (Signed Final 05/13/2019 04:35 pm) ---------------------------------------------------------------------- Patient Info  ID #:       [id number]                          D.O.B.:  [birth year] (33 yrs)  Name:       Deborah Park               Visit Date: 05/13/2019 03:12 pm ---------------------------------------------------------------------- Performed By  Performed By:     Jacob Moores BS,       Ref. Address:     [address], [place]  [address]                                                             [place]                                                             [address]  Attending:        Ma Rings MD         [facility]  Referred By:      [facility] ---------------------------------------------------------------------- Orders   #  Description                          Code         Ordered By   1  [place] MFM OB COMP LESS THAN             76801.4      JAMES ARNOLD      14 WEEKS  ----------------------------------------------------------------------   #  Order #                    Accession #                 Episode #   1  [id number]                  [id number]                  [id number]  ---------------------------------------------------------------------- Indications   Poor obstetric history: Previous preterm       O09.219   delivery, antepartum (   [redacted] weeks gestation of pregnancy                 Z3A.10   Tobacco use complicating pregnancy,            O99.330   unspecified trimester   High risk pregnancy due to maternal drug       O99.320 F19.90   abuse                                          O09.899   Size-Date Discrepancy                          O26.849   Medical complication of pregnancy (Chiari      O26.90   malformation)   Other mental disorder complicating             O99.340   pregnancy, unspecified trimester  ---------------------------------------------------------------------- Fetal  Evaluation  Num Of Fetuses:         1  Preg. Location:         Intrauterine  Gest. Sac:              Intrauterine  Yolk Sac:               Visualized  Fetal Pole:             Visualized  Fetal Heart Rate(bpm):  162  Cardiac Activity:       Observed  Amniotic Fluid  AFI FV:      Within normal limits ---------------------------------------------------------------------- Biometry  CRL:      37.4  mm     G. Age:  10w 3d                  EDD:   12/06/19 ---------------------------------------------------------------------- OB History  Blood Type:    O+  Gravidity:    4         Term:   1        Prem:   2        SAB:   0  TOP:          0       Ectopic:  0        Living: 2 ---------------------------------------------------------------------- Gestational Age  LMP:           17w 3d        Date:  01/11/19                 EDD:   10/18/19  Best:          Deborah Park 3d     Det. By:  U/S C R L (05/13/19)     EDD:   12/06/19 ---------------------------------------------------------------------- Anatomy  Choroid Plexus:        Visualized             Upper Extremities:      Visualized  Heart:                 Visualized             Lower Extremities:      Visualized  Other:  Technically difficult due to early gestational age. ---------------------------------------------------------------------- Cervix Uterus Adnexa  Cervix  Normal appearance by transabdominal scan.  Uterus  No abnormality visualized.  Left Ovary  Within normal limits.   Right Ovary  Small corpus luteum noted. Within normal limits.  Cul De Sac  No free fluid seen.  Adnexa  No abnormality visualized. ---------------------------------------------------------------------- Comments  This patient was seen for a first trimester ultrasound today  due to uncertain dates.  The patient has a significant history  of multiple prior preterm births.  She reports that she  delivered at 25 weeks in her first pregnancy.  Unfortunately,  that baby did not survive after delivery.  She then had a 32-  week preterm birth in her second pregnancy.  In her third  pregnancy, she was advised to use weekly progesterone  injections to prevent another preterm birth.  The patient  reports that she did not go in for all of the progesterone  injections and subsequently had a 28-week preterm birth.  She denies any significant past medical history and denies  any problems in her current pregnancy.  She reports that she had a cell free DNA test drawn last  week.  The results of that test was not available today.  On today's exam, the crown-rump length measured  consistent with a fetus at 10 weeks and 3 days giving her an  [facility] of December 06, 2019.  Based on a presumed LMP, she  would have been 17+ weeks today.  Therefore, December 06, 2019 should be used as her final EDC.  To decrease her risk of another preterm birth, the patient  should receive weekly progesterone (17 P, [name])  injections starting at around 16 weeks and continued weekly  until 36 weeks.  The patient was advised that hopefully, with  the weekly progesterone injections, her risk of having another  preterm birth can be decreased.  We will also plan on following her with biweekly transvaginal  cervical length measurements starting at around 16 weeks.  Should progressive cervical shortening be noted during these  cervical length measurements, a cervical cerclage may be  considered.  The patient understands that due to her history of 3 prior  preterm  births, that she is at increased risk for another  preterm birth despite all medical interventions.  The patient  stated that she understands this.  Hopefully, with close follow-  up and surveillance, we can help her obtain a successful  pregnancy outcome for her current pregnancy.  A cervical length measurement was scheduled at 16 weeks  and a fetal anatomy scan was scheduled at around 18 to 19  weeks.  A total of 30 minutes was spent counseling and coordinating  the care for this patient.  Greater than 50% of the time was  spent in direct face-to-face contact. ----------------------------------------------------------------------                   Ma Rings, MD Electronically Signed Final Report   05/13/2019 04:35 pm ----------------------------------------------------------------------   Assessment and Plan:  Pregnancy: [id number] at [redacted]w[redacted]d 1. Supervision of high risk pregnancy, antepartum Stable Dates changed by first trimester U/S  2. History of preterm delivery, currently pregnant Will start 17 OHP at next visit U/S for CL started at 16 weeks and weekly scheduled  3. Multigravida of advanced maternal age in second trimester MCAD noted on NIPS. Has seen genetic counselor.   4. Chiari malformation type II (HCC) Will need neurology and anesthesia consult later in pregnancy  5. 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 I discussed the assessment and treatment plan with the patient. The patient was provided an opportunity to ask questions and all were answered. The patient agreed with the plan and demonstrated an understanding of the instructions. The patient was advised to call back or seek an in-person office evaluation/go to [facility]U at [facility] for any urgent or concerning symptoms. Please  refer to After Visit Summary for other counseling recommendations.   I provided 8 minutes of face-to-face time during this encounter.  No follow-ups on file.  Future Appointments  Date Time Provider Department Center  06/25/2019  1:15 PM [facility] NURSE WH-MFC MFC-US  06/25/2019  1:15 PM WH-MFC [place] 4 WH-MFCUS MFC-US  07/09/2019  1:15 PM WH-MFC NURSE WH-MFC MFC-US  07/09/2019  1:15 PM WH-MFC [place] 4 WH-MFCUS MFC-US    Hermina Staggers, MD [facility] for [facility], [facility]

## 2019-06-05 NOTE — Progress Notes (Signed)
I connected with  Deborah Park on 06/05/19 by a video enabled telemedicine application and verified that I am speaking with the correct person using two identifiers.   I discussed the limitations of evaluation and management by telemedicine. The patient expressed understanding and agreed to proceed.   ROB. Completed Forms for [name], patient will need to come in and sign Forms so that they can be faxed to pharmacy.  BP Cuff ordered today.

## 2019-06-16 ENCOUNTER — Encounter: Payer: Self-pay | Admitting: Obstetrics and Gynecology

## 2019-06-20 ENCOUNTER — Encounter (HOSPITAL_COMMUNITY): Payer: Self-pay

## 2019-06-25 ENCOUNTER — Ambulatory Visit (HOSPITAL_COMMUNITY): Admission: RE | Admit: 2019-06-25 | Payer: Medicaid Other | Source: Ambulatory Visit

## 2019-06-25 ENCOUNTER — Ambulatory Visit (HOSPITAL_COMMUNITY): Payer: Medicaid Other

## 2019-06-26 ENCOUNTER — Encounter: Payer: Medicaid Other | Admitting: Obstetrics & Gynecology

## 2019-06-27 ENCOUNTER — Encounter (HOSPITAL_COMMUNITY): Payer: Self-pay

## 2019-06-27 ENCOUNTER — Ambulatory Visit (HOSPITAL_COMMUNITY): Admission: RE | Admit: 2019-06-27 | Payer: Medicaid Other | Source: Ambulatory Visit

## 2019-06-27 ENCOUNTER — Ambulatory Visit (HOSPITAL_COMMUNITY): Payer: Self-pay

## 2019-07-03 ENCOUNTER — Telehealth: Payer: Self-pay | Admitting: Obstetrics

## 2019-07-07 ENCOUNTER — Encounter: Payer: Medicaid Other | Admitting: Obstetrics & Gynecology

## 2019-07-09 ENCOUNTER — Ambulatory Visit (HOSPITAL_COMMUNITY): Payer: Medicaid Other | Attending: Obstetrics

## 2019-07-09 ENCOUNTER — Ambulatory Visit (HOSPITAL_COMMUNITY): Admission: RE | Admit: 2019-07-09 | Payer: Medicaid Other | Source: Ambulatory Visit

## 2020-08-11 ENCOUNTER — Encounter (HOSPITAL_COMMUNITY): Payer: Self-pay | Admitting: *Deleted

## 2020-08-11 ENCOUNTER — Other Ambulatory Visit: Payer: Self-pay

## 2020-08-11 ENCOUNTER — Emergency Department (HOSPITAL_COMMUNITY)
Admission: EM | Admit: 2020-08-11 | Discharge: 2020-08-11 | Disposition: A | Discharge status: Home / Self Care | Payer: BLUE CROSS/BLUE SHIELD | Attending: Emergency Medicine | Admitting: Emergency Medicine

## 2020-08-11 ENCOUNTER — Emergency Department (HOSPITAL_COMMUNITY): Payer: BLUE CROSS/BLUE SHIELD

## 2020-08-11 DIAGNOSIS — R041 Hemorrhage from throat: Secondary | ICD-10-CM | POA: Insufficient documentation

## 2020-08-11 DIAGNOSIS — F17210 Nicotine dependence, cigarettes, uncomplicated: Secondary | ICD-10-CM | POA: Diagnosis not present

## 2020-08-11 DIAGNOSIS — R58 Hemorrhage, not elsewhere classified: Secondary | ICD-10-CM

## 2020-08-11 DIAGNOSIS — H5789 Other specified disorders of eye and adnexa: Secondary | ICD-10-CM | POA: Diagnosis not present

## 2020-08-11 LAB — CBC WITH DIFFERENTIAL/PLATELET
Abs Immature Granulocytes: 0.01 10*3/uL (ref 0.00–0.07)
Basophils Absolute: 0.1 10*3/uL (ref 0.0–0.1)
Basophils Relative: 1 %
Eosinophils Absolute: 0.1 10*3/uL (ref 0.0–0.5)
Eosinophils Relative: 1 %
HCT: 42.7 % (ref 36.0–46.0)
Hemoglobin: 14.2 g/dL (ref 12.0–15.0)
Immature Granulocytes: 0 %
Lymphocytes Relative: 28 %
Lymphs Abs: 2.1 10*3/uL (ref 0.7–4.0)
MCH: 32.4 pg (ref 26.0–34.0)
MCHC: 33.3 g/dL (ref 30.0–36.0)
MCV: 97.5 fL (ref 80.0–100.0)
Monocytes Absolute: 0.6 10*3/uL (ref 0.1–1.0)
Monocytes Relative: 8 %
Neutro Abs: 4.8 10*3/uL (ref 1.7–7.7)
Neutrophils Relative %: 62 %
Platelets: 314 10*3/uL (ref 150–400)
RBC: 4.38 MIL/uL (ref 3.87–5.11)
RDW: 12.6 % (ref 11.5–15.5)
WBC: 7.7 10*3/uL (ref 4.0–10.5)
nRBC: 0 % (ref 0.0–0.2)

## 2020-08-11 LAB — COMPREHENSIVE METABOLIC PANEL
ALT: 12 U/L (ref 0–44)
AST: 21 U/L (ref 15–41)
Albumin: 4.3 g/dL (ref 3.5–5.0)
Alkaline Phosphatase: 51 U/L (ref 38–126)
Anion gap: 6 (ref 5–15)
BUN: 12 mg/dL (ref 6–20)
CO2: 26 mmol/L (ref 22–32)
Calcium: 9.1 mg/dL (ref 8.9–10.3)
Chloride: 106 mmol/L (ref 98–111)
Creatinine, Ser: 0.43 mg/dL — ABNORMAL LOW (ref 0.44–1.00)
GFR, Estimated: >60 mL/min (ref >60)
Glucose, Bld: 95 mg/dL (ref 70–99)
Potassium: 3.8 mmol/L (ref 3.5–5.1)
Sodium: 138 mmol/L (ref 135–145)
Total Bilirubin: 1 mg/dL (ref 0.3–1.2)
Total Protein: 7.1 g/dL (ref 6.5–8.1)

## 2020-08-11 MED ORDER — SALINE SPRAY 0.65 % NA SOLN: 1.0000 | NASAL | 0 refills | Status: DC | PRN

## 2020-08-11 NOTE — Discharge Instructions (Signed)
With your sensation of bleeding, you may be experiencing some sinus dryness.  Your findings today are reassuring, there is no evidence for pneumonia, substantial abnormalities in your blood counts either. Please use saline intranasal spray, twice daily for the next week.  Follow-up with your physician or return here for concerning changes in your condition.

## 2020-08-11 NOTE — ED Triage Notes (Signed)
Pt complains of left eye pressure since last week, started while she was reading the screen of her handheld. No visual changes. She became concerned today when noticed taste of blood in mouth when bending forward. She reports similar feeling last summer and had MRI and was told not to use a phone but she cannot recall her diagnosis.

## 2020-08-11 NOTE — ED Provider Notes (Signed)
[facility] DEPT Provider Note   CSN: [id number] Arrival date & time: 08/11/20  1330     History Chief Complaint  Patient presents with  . Eye Problem  . tasting blood    Deborah Park is a 35 y.o. female.  HPI Patient presents with concern of tasting blood in her mouth, and consideration of her left eye being smaller than usual.  She has a history of anxiety, states that about 1 year ago she feels like she began having left eye issues secondary to Foley usage.  She has subsequently had less electronic screen exposure, notes that she has been doing generally well until last few days.  Over these past few days the patient notes that she has felt her left eye has been different from normal, now smaller than it usually is, with no visual disturbance, no visual loss.  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    Past Medical History:  Diagnosis Date  . Anxiety   . 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 Memory loss 09/17/2013  . Mental disorder   . Sleep disturbance 09/17/2013    Patient Active Problem List   Diagnosis Date Noted  . Genetic carrier 06/05/2019  . Screening for genetic disease carrier status 05/28/2019  . AMA (advanced maternal age) multigravida 35+ 05/10/2019  . 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 Supervision of high risk pregnancy, antepartum 05/08/2019  . History of preterm delivery, currently pregnant 05/08/2019  . Amphetamine abuse ([place]) 11/04/2018  . MDD (major depressive disorder), recurrent severe, without psychosis ([place]) 11/04/2018  . Psychoactive substance-induced psychosis ([place])   . MDD (major depressive disorder), single episode, severe with psychosis ([place]) 08/29/2018  . Polysubstance abuse ([place]) 02/14/2018  . Memory loss 09/17/2013  . Sleep disturbance 09/17/2013    Past Surgical History:  Procedure Laterality Date  . KIDNEY SURGERY        OB History    Gravida  4   Para  3   Term  1   Preterm  2   AB  0   Living  2     SAB  0   IAB      Ectopic      Multiple  0   Live Births  2           Family History  Problem Relation Age of Onset  . Healthy Mother   . Healthy Father   . Bipolar disorder Maternal Grandmother   . Seizures Neg Hx   . Parkinsonism Neg Hx   . Multiple sclerosis Neg Hx   . Dementia Neg Hx     Social History   Tobacco Use  . Smoking status: Current Every Day Smoker    Packs/day: 0.50    Years: 15.00    Pack years: 7.50    Types: Cigarettes  . Smokeless tobacco: Never Used  . Tobacco comment: 10 cigs/day  Vaping Use  . Vaping Use: Never used  Substance Use Topics  . Alcohol use: Not Currently  . Drug use: Yes    Types: Amphetamines, Cocaine    Comment: last report 08 Feb 2018. pt reports none in preg    Home Medications Prior to Admission medications   Medication Sig Start Date End Date Taking? Authorizing Provider  sodium chloride (OCEAN) 0.65 % SOLN nasal spray Place 1 spray into both nostrils as needed for congestion.  08/11/20  Yes Carmin Muskrat, MD  Blood Pressure Monitoring (BLOOD PRESSURE KIT) DEVI 1 kit by Does not apply route once a week. Check Blood Pressure regularly and record readings into the [employer] App.  Large Cuff.  DX O90.0 06/05/19   [place] Mode, MD  Prenat-FeAsp-Meth-FA-DHA w/o A (PRENATE PIXIE) 10-0.6-0.4-200 MG CAPS Take 1 tablet by mouth daily. 05/08/19   [place] Mode, MD    Allergies    Patient has no known allergies.  Review of Systems   Review of Systems  Constitutional:       Per HPI, otherwise negative  HENT:       Per HPI, otherwise negative  Eyes: Negative for pain.  Respiratory:       Per HPI, otherwise negative  Cardiovascular:       Per HPI, otherwise negative  Gastrointestinal: Negative for vomiting.  Endocrine:       Negative aside from HPI  Genitourinary:       Neg aside from HPI   Musculoskeletal:        Per HPI, otherwise negative  Skin: Negative.   Neurological: Negative for syncope and light-headedness.    Physical Exam Updated Vital Signs BP 107/77 (BP Location: Left Arm)   Pulse 74   Temp 98 F (36.7 C) (Oral)   Resp 18   LMP 07/21/2020   SpO2 100%   Physical Exam Vitals and nursing note reviewed.  Constitutional:      General: She is not in acute distress.    Appearance: She is well-developed.  HENT:     Head: Normocephalic and atraumatic.     Comments: Oropharynx unremarkable Eyes:     General:        Right eye: No discharge.        Left eye: No discharge.     Extraocular Movements: Extraocular movements intact.     Conjunctiva/sclera: Conjunctivae normal.     Pupils: Pupils are equal, round, and reactive to light.     Comments: Completely unremarkable eye exam, no tenderness in the periorbital region bilaterally  Cardiovascular:     Rate and Rhythm: Normal rate and regular rhythm.  Pulmonary:     Effort: Pulmonary effort is normal. No respiratory distress.     Breath sounds: Normal breath sounds. No stridor.  Abdominal:     General: There is no distension.  Skin:    General: Skin is warm and dry.  Neurological:     Mental Status: She is alert and oriented to person, place, and time.     Cranial Nerves: No cranial nerve deficit.     ED Results / Procedures / Treatments   Labs (all labs ordered are listed, but only abnormal results are displayed) Labs Reviewed  COMPREHENSIVE METABOLIC PANEL - Abnormal; Notable for the following components:      Result Value   Creatinine, Ser 0.43 (*)    All other components within normal limits  CBC WITH DIFFERENTIAL/PLATELET    EKG None  Radiology DG Chest 2 View  Result Date: 08/11/2020 CLINICAL DATA:  Possible hemoptysis.  History of smoking. EXAM: CHEST - 2 VIEW COMPARISON:  None. FINDINGS: The cardiomediastinal silhouette is within normal limits. The lungs are well inflated and clear. There is no evidence of  pleural effusion or pneumothorax. No acute osseous abnormality is identified. IMPRESSION: No active cardiopulmonary disease. Electronically Signed   By: Logan Bores M.D.   On: 08/11/2020 14:48    Procedures Procedures   Medications Ordered in  ED Medications - No data to display  ED Course  I have reviewed the triage vital signs and the nursing notes.  Pertinent labs & imaging results that were available during my care of the patient were reviewed by me and considered in my medical decision making (see chart for details).  4:13 PM On repeat exam the patient awake, alert, in no distress.  Vital signs unremarkable, reviewed labs, x-ray unremarkable.  With a reassuring physical exam including no extraocular motion abnormalities, no evidence for asymmetry of the eyes, no visual deficiencies, low suspicion for intraocular phenomena, no indication for advanced imaging.  X-ray performed, reviewed, no evidence for pneumonia.  Patient's symptoms may be secondary to dry mucosa as she is a smoker, but are otherwise reassuring, no evidence for substantial exsanguination, infection, pneumonia, bacteremia, sepsis. Discussing findings, patient reiterates belief that her recent exposure to electronic devices while at work may have exacerbated her condition, requests a work note to minimize exposure to computer screens.  This was accommodated, patient was started on saline spray, discharged in stable condition.  Final Clinical Impression(s) / ED Diagnoses Final diagnoses:  Bleeding    Rx / DC Orders ED Discharge Orders         Ordered    sodium chloride (OCEAN) 0.65 % SOLN nasal spray  As needed        08/11/20 1557           Carmin Muskrat, MD 08/11/20 2311

## 2020-09-08 ENCOUNTER — Encounter (HOSPITAL_COMMUNITY): Payer: Self-pay

## 2020-09-08 ENCOUNTER — Other Ambulatory Visit: Payer: Self-pay

## 2020-09-08 ENCOUNTER — Emergency Department (HOSPITAL_COMMUNITY)
Admission: EM | Admit: 2020-09-08 | Discharge: 2020-09-08 | Disposition: A | Discharge status: Home / Self Care | Payer: BLUE CROSS/BLUE SHIELD | Attending: Emergency Medicine | Admitting: Emergency Medicine

## 2020-09-08 DIAGNOSIS — H1033 Unspecified acute conjunctivitis, bilateral: Secondary | ICD-10-CM | POA: Insufficient documentation

## 2020-09-08 DIAGNOSIS — F17210 Nicotine dependence, cigarettes, uncomplicated: Secondary | ICD-10-CM | POA: Insufficient documentation

## 2020-09-08 NOTE — Discharge Instructions (Addendum)
Call your primary care doctor or specialist as discussed in the next 2-3 days.   Return immediately back to the ER if:  Your symptoms worsen within the next 12-24 hours. You develop new symptoms such as new fevers, persistent vomiting, new pain, shortness of breath, or new weakness or numbness, or if you have any other concerns.  
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
## 2020-09-08 NOTE — ED Notes (Signed)
Pt didn't answered when called for vitals  
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
## 2020-09-08 NOTE — ED Notes (Signed)
Pt called 3x in lobby to go to bed, no answer.

## 2020-09-10 ENCOUNTER — Other Ambulatory Visit: Payer: Self-pay

## 2020-09-10 ENCOUNTER — Emergency Department (HOSPITAL_COMMUNITY)
Admission: EM | Admit: 2020-09-10 | Discharge: 2020-09-11 | Disposition: A | Discharge status: Home / Self Care | Payer: BLUE CROSS/BLUE SHIELD | Attending: Emergency Medicine | Admitting: Emergency Medicine

## 2020-09-10 DIAGNOSIS — F17210 Nicotine dependence, cigarettes, uncomplicated: Secondary | ICD-10-CM | POA: Diagnosis not present

## 2020-09-10 DIAGNOSIS — H5713 Ocular pain, bilateral: Secondary | ICD-10-CM | POA: Diagnosis present

## 2020-09-10 DIAGNOSIS — H5789 Other specified disorders of eye and adnexa: Secondary | ICD-10-CM

## 2020-09-11 NOTE — ED Notes (Signed)
Pt discharged LWBS when she had stepped out to go to her car without notifying staff.  PT now in lobby.

## 2020-09-11 NOTE — Discharge Instructions (Addendum)
Allergy and eye wetting drops are available over-the-counter.  Please discuss with pharmacist which drops are best for you.

## 2020-09-11 NOTE — ED Provider Notes (Signed)
[facility] EMERGENCY DEPARTMENT Provider Note   CSN: [id number] Arrival date & time: 09/10/20  1922     History No chief complaint on file.   Deborah Park is a 35 y.o. female.  HPI 35 year old female history of recent eye injury about 2 months ago who has been seen and cleared by ophthalmology presents today complaining of some irritation to her bilateral eyes over the past several days.  She was seen in the ED couple days ago and states that it got somewhat better but then got red and irritated again.  She has been attempting to use some saline.  She reports some blurring of vision but otherwise vision has been normal.  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   MDD (major depressive disorder), recurrent severe, 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   Psychoactive substance-induced psychosis ([place])    MDD (major depressive disorder), single episode, severe with psychosis ([place]) 08/29/2018   Polysubstance abuse ([place]) 02/14/2018   Memory loss 09/17/2013   Sleep disturbance 09/17/2013    Past Surgical History:  Procedure Laterality Date   KIDNEY SURGERY       OB History     Gravida  4   Para  3   Term  1   Preterm  2   AB  0   Living  2      SAB  0   IAB      Ectopic      Multiple  0   Live Births  2           Family History  Problem Relation Age of Onset   Healthy Mother    Healthy  Father    Bipolar disorder Maternal Grandmother    Seizures Neg Hx    Parkinsonism Neg Hx    Multiple sclerosis Neg Hx    Dementia Neg Hx     Social History   Tobacco Use   Smoking status: Every Day    Packs/day: 0.50    Years: 15.00    Pack years: 7.50    Types: Cigarettes   Smokeless tobacco: Never   Tobacco comments:    10 cigs/day  Vaping Use   Vaping Use: Never used  Substance Use Topics   Alcohol use: Not Currently   Drug use: Yes    Types: Amphetamines, Cocaine    Comment: last report 08 Feb 2018. pt reports none in preg    Home Medications Prior to Admission medications   Medication Sig Start Date End Date Taking? Authorizing Provider  Blood Pressure Monitoring (BLOOD PRESSURE KIT) DEVI 1 kit by Does not apply route once a week. Check Blood Pressure regularly and record readings into the Babyscripts App.  Large Cuff.  DX O90.0 06/05/19   Woodroe Mode, MD  Prenat-FeAsp-Meth-FA-DHA w/o A (PRENATE PIXIE) 10-0.6-0.4-200 MG CAPS Take 1 tablet by mouth daily. 05/08/19   Emeterio Reeve  G, MD  sodium chloride (OCEAN) 0.65 % SOLN nasal spray Place 1 spray into both nostrils as needed for congestion. 08/11/20   Carmin Muskrat, MD    Allergies    Patient has no known allergies.  Review of Systems   Review of Systems  All other systems reviewed and are negative.  Physical Exam Updated Vital Signs BP 110/80 (BP Location: Right Arm)   Pulse 75   Temp 99 F (37.2 C) (Oral)   Resp 17   SpO2 100%   Physical Exam Vitals and nursing note reviewed.  Constitutional:      Appearance: Normal appearance.  HENT:     Head: Normocephalic and atraumatic.     Right Ear: Tympanic membrane and external ear normal.     Left Ear: Tympanic membrane normal.     Nose: Nose normal.     Mouth/Throat:     Mouth: Mucous membranes are moist.     Pharynx: Oropharynx is clear.  Eyes:     Extraocular Movements: Extraocular movements intact.     Pupils: Pupils are equal, round, and  reactive to light.     Comments: Mild conjunctival injection No discharge noted Funduscopic exam appears normal No visual field deficits noted  Cardiovascular:     Rate and Rhythm: Normal rate and regular rhythm.  Pulmonary:     Effort: Pulmonary effort is normal.  Abdominal:     Palpations: Abdomen is soft.  Musculoskeletal:        General: Normal range of motion.     Cervical back: Normal range of motion.  Skin:    General: Skin is warm.     Capillary Refill: Capillary refill takes less than 2 seconds.  Neurological:     General: No focal deficit present.     Mental Status: She is alert.     Cranial Nerves: No cranial nerve deficit.     Motor: No weakness.     Coordination: Coordination normal.  Psychiatric:        Mood and Affect: Mood normal.    ED Results / Procedures / Treatments   Labs (all labs ordered are listed, but only abnormal results are displayed) Labs Reviewed - No data to display  EKG None  Radiology No results found.  Procedures Procedures   Medications Ordered in ED Medications - No data to display  ED Course  I have reviewed the triage vital signs and the nursing notes.  Pertinent labs & imaging results that were available during my care of the patient were reviewed by me and considered in my medical decision making (see chart for details).    MDM Rules/Calculators/A&P                         Patient with some eye irritation noted.  Does not appear to have any evidence of acute infection.  Patient advised regarding using allergy medications.  Patient advised to follow-up with ophthalmologist.  inical Impression(s) / ED Diagnoses Final diagnoses:  Irritation of both eyes    Rx / DC Orders ED Discharge Orders     None        Pattricia Boss, MD 09/11/20 [phone]

## 2020-10-16 DIAGNOSIS — H5713 Ocular pain, bilateral: Secondary | ICD-10-CM | POA: Insufficient documentation

## 2020-10-16 DIAGNOSIS — Z5321 Procedure and treatment not carried out due to patient leaving prior to being seen by health care provider: Secondary | ICD-10-CM | POA: Insufficient documentation

## 2020-10-17 ENCOUNTER — Encounter (HOSPITAL_COMMUNITY): Payer: Self-pay | Admitting: Emergency Medicine

## 2020-10-17 ENCOUNTER — Emergency Department (HOSPITAL_COMMUNITY)
Admission: EM | Admit: 2020-10-17 | Discharge: 2020-10-17 | Disposition: A | Discharge status: Home / Self Care | Payer: BLUE CROSS/BLUE SHIELD | Attending: Emergency Medicine | Admitting: Emergency Medicine
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
## 2020-10-17 NOTE — ED Notes (Signed)
Unable to locate patient at waiting room multiple times.

## 2020-11-08 ENCOUNTER — Encounter (HOSPITAL_BASED_OUTPATIENT_CLINIC_OR_DEPARTMENT_OTHER): Payer: Self-pay

## 2020-11-08 ENCOUNTER — Emergency Department (HOSPITAL_COMMUNITY)
Admission: EM | Admit: 2020-11-08 | Discharge: 2020-11-08 | Disposition: A | Discharge status: Home / Self Care | Payer: BLUE CROSS/BLUE SHIELD | Attending: Emergency Medicine | Admitting: Emergency Medicine

## 2020-11-08 ENCOUNTER — Encounter (HOSPITAL_COMMUNITY): Payer: Self-pay | Admitting: *Deleted

## 2020-11-08 ENCOUNTER — Emergency Department (HOSPITAL_BASED_OUTPATIENT_CLINIC_OR_DEPARTMENT_OTHER)
Admission: EM | Admit: 2020-11-08 | Discharge: 2020-11-08 | Disposition: A | Discharge status: Home / Self Care | Payer: BLUE CROSS/BLUE SHIELD | Source: Home / Self Care | Attending: Emergency Medicine | Admitting: Emergency Medicine

## 2020-11-08 ENCOUNTER — Other Ambulatory Visit: Payer: Self-pay

## 2020-11-08 DIAGNOSIS — F17210 Nicotine dependence, cigarettes, uncomplicated: Secondary | ICD-10-CM | POA: Insufficient documentation

## 2020-11-08 DIAGNOSIS — H5789 Other specified disorders of eye and adnexa: Secondary | ICD-10-CM | POA: Insufficient documentation

## 2020-11-08 DIAGNOSIS — H5712 Ocular pain, left eye: Secondary | ICD-10-CM

## 2020-11-08 DIAGNOSIS — D72829 Elevated white blood cell count, unspecified: Secondary | ICD-10-CM | POA: Insufficient documentation

## 2020-11-08 DIAGNOSIS — H11433 Conjunctival hyperemia, bilateral: Secondary | ICD-10-CM | POA: Insufficient documentation

## 2020-11-08 DIAGNOSIS — R110 Nausea: Secondary | ICD-10-CM | POA: Insufficient documentation

## 2020-11-08 DIAGNOSIS — R1084 Generalized abdominal pain: Secondary | ICD-10-CM | POA: Diagnosis not present

## 2020-11-08 LAB — CBC WITH DIFFERENTIAL/PLATELET
Abs Immature Granulocytes: 0.03 10*3/uL (ref 0.00–0.07)
Basophils Absolute: 0.1 10*3/uL (ref 0.0–0.1)
Basophils Relative: 1 %
Eosinophils Absolute: 0 10*3/uL (ref 0.0–0.5)
Eosinophils Relative: 0 %
HCT: 46.8 % — ABNORMAL HIGH (ref 36.0–46.0)
Hemoglobin: 16.1 g/dL — ABNORMAL HIGH (ref 12.0–15.0)
Immature Granulocytes: 0 %
Lymphocytes Relative: 18 %
Lymphs Abs: 2.2 10*3/uL (ref 0.7–4.0)
MCH: 33.5 pg (ref 26.0–34.0)
MCHC: 34.4 g/dL (ref 30.0–36.0)
MCV: 97.3 fL (ref 80.0–100.0)
Monocytes Absolute: 0.6 10*3/uL (ref 0.1–1.0)
Monocytes Relative: 5 %
Neutro Abs: 9.6 10*3/uL — ABNORMAL HIGH (ref 1.7–7.7)
Neutrophils Relative %: 76 %
Platelets: 312 10*3/uL (ref 150–400)
RBC: 4.81 MIL/uL (ref 3.87–5.11)
RDW: 12.5 % (ref 11.5–15.5)
WBC: 12.6 10*3/uL — ABNORMAL HIGH (ref 4.0–10.5)
nRBC: 0 % (ref 0.0–0.2)

## 2020-11-08 LAB — COMPREHENSIVE METABOLIC PANEL
ALT: 14 U/L (ref 0–44)
AST: 21 U/L (ref 15–41)
Albumin: 4.6 g/dL (ref 3.5–5.0)
Alkaline Phosphatase: 56 U/L (ref 38–126)
Anion gap: 10 (ref 5–15)
BUN: 14 mg/dL (ref 6–20)
CO2: 25 mmol/L (ref 22–32)
Calcium: 9.4 mg/dL (ref 8.9–10.3)
Chloride: 102 mmol/L (ref 98–111)
Creatinine, Ser: 0.79 mg/dL (ref 0.44–1.00)
GFR, Estimated: >60 mL/min (ref >60)
Glucose, Bld: 89 mg/dL (ref 70–99)
Potassium: 4.6 mmol/L (ref 3.5–5.1)
Sodium: 137 mmol/L (ref 135–145)
Total Bilirubin: 1.2 mg/dL (ref 0.3–1.2)
Total Protein: 7.5 g/dL (ref 6.5–8.1)

## 2020-11-08 LAB — I-STAT BETA HCG BLOOD, ED (MC, WL, AP ONLY): I-stat hCG, quantitative: <5 m[IU]/mL (ref <5)

## 2020-11-08 LAB — LIPASE, BLOOD: Lipase: 22 U/L (ref 11–51)

## 2020-11-08 MED ORDER — ONDANSETRON 4 MG PO TBDP: 4.0000 mg | ORAL_TABLET | Freq: Three times a day (TID) | ORAL | 0 refills | Status: DC | PRN

## 2020-11-08 MED ORDER — DICYCLOMINE HCL 10 MG/ML IM SOLN
20.0000 mg | Freq: Once | INTRAMUSCULAR | Status: AC
  Administered 2020-11-08: 20 mg via INTRAMUSCULAR
  Filled 2020-11-08: qty 2

## 2020-11-08 MED ORDER — ONDANSETRON HCL 4 MG/2ML IJ SOLN
4.0000 mg | Freq: Once | INTRAMUSCULAR | Status: AC
  Administered 2020-11-08: 4 mg via INTRAVENOUS
  Filled 2020-11-08: qty 2

## 2020-11-08 MED ORDER — FAMOTIDINE IN NACL 20-0.9 MG/50ML-% IV SOLN
20.0000 mg | Freq: Once | INTRAVENOUS | Status: AC
  Administered 2020-11-08: 20 mg via INTRAVENOUS
  Filled 2020-11-08: qty 50

## 2020-11-08 MED ORDER — ERYTHROMYCIN 5 MG/GM OP OINT: TOPICAL_OINTMENT | OPHTHALMIC | 0 refills | Status: DC

## 2020-11-08 NOTE — Discharge Instructions (Addendum)
Follow-up with the ophthalmologist listed below. Take the Zofran as needed for nausea. Make sure you are using saline drops to help with your irritation in your eyes. Return to the ER for worsening abdominal pain, continued vomiting, fever or chest pain.

## 2020-11-08 NOTE — ED Triage Notes (Signed)
Pt complains of bilateral eye pain, reports they feel  "softer" today, also reports discharge. She reports injury at work a few months ago and has had eye issues since.   She also reports abdominal pain, nausea, fever starting today.

## 2020-11-08 NOTE — ED Notes (Signed)
This RN presented the AVS utilizing Teachback Method. Patient verbalizes understanding of Discharge Instructions. Opportunity for Questioning and Answers were provided. Patient Discharged from ED ambulatory to Home.   

## 2020-11-08 NOTE — ED Provider Notes (Signed)
[facility] DEPT Provider Note   CSN: [id number] Arrival date & time: 11/08/20  1030     History Chief Complaint  Patient presents with   Eye Problem   Abdominal Pain    Deborah Park is a 35 y.o. female with a past medical history of anxiety presenting to the ED for multiple complaints. Her first complaint is continued bilateral eye irritation.  States that she had an incident at work where she had a chemical exposure to both of her eyes.  Since then she has had several visits to the ER and was evaluated by an ophthalmologist.  She was told that her exam was unremarkable other than "scarring on my optic nerve."  She says she woke up this morning with increase in "gel like discharge" in both of her eyes.  Denies any reinjury, vision changes, numbness in arms or legs. Complaining of generalized abdominal discomfort, nausea.  Had an episode of nonbloody, nonbilious emesis yesterday but no emesis today.  No changes to bowel movements or urination.  Has not tried medications to help with her symptoms.   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   MDD (major depressive disorder), recurrent severe, 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   Psychoactive substance-induced psychosis ([place])    MDD (major depressive disorder), single episode, severe with psychosis ([place]) 08/29/2018   Polysubstance abuse ([place]) 02/14/2018   Memory loss 09/17/2013    Sleep disturbance 09/17/2013    Past Surgical History:  Procedure Laterality Date   KIDNEY SURGERY       OB History     Gravida  4   Para  3   Term  1   Preterm  2   AB  0   Living  2      SAB  0   IAB      Ectopic      Multiple  0   Live Births  2           Family History  Problem Relation Age of Onset   Healthy Mother    Healthy Father    Bipolar disorder Maternal Grandmother    Seizures Neg Hx    Parkinsonism Neg Hx    Multiple sclerosis Neg Hx    Dementia Neg Hx     Social History   Tobacco Use   Smoking status: Every Day    Packs/day: 0.50    Years: 15.00    Pack years: 7.50    Types: Cigarettes   Smokeless tobacco: Never   Tobacco comments:    10 cigs/day  Vaping Use   Vaping Use: Never used  Substance Use Topics   Alcohol use: Not Currently   Drug use: Yes    Types: Amphetamines, Cocaine    Comment: last report 08 Feb 2018. pt reports none in preg    Home Medications Prior to Admission medications   Medication Sig Start Date  End Date Taking? Authorizing Provider  ondansetron (ZOFRAN ODT) 4 MG disintegrating tablet Take 1 tablet (4 mg total) by mouth every 8 (eight) hours as needed for nausea or vomiting. 11/08/20  Yes Taina Landry, PA-C  Blood Pressure Monitoring (BLOOD PRESSURE KIT) DEVI 1 kit by Does not apply route once a week. Check Blood Pressure regularly and record readings into the Babyscripts App.  Large Cuff.  DX O90.0 06/05/19   Woodroe Mode, MD  Prenat-FeAsp-Meth-FA-DHA w/o A (PRENATE PIXIE) 10-0.6-0.4-200 MG CAPS Take 1 tablet by mouth daily. 05/08/19   Woodroe Mode, MD  sodium chloride (OCEAN) 0.65 % SOLN nasal spray Place 1 spray into both nostrils as needed for congestion. 08/11/20   Carmin Muskrat, MD    Allergies    Patient has no known allergies.  Review of Systems   Review of Systems  Constitutional:  Negative for appetite change, chills and fever.  HENT:  Negative for ear pain, rhinorrhea, sneezing and  sore throat.   Eyes:  Positive for discharge, redness and itching. Negative for photophobia and visual disturbance.  Respiratory:  Negative for cough, chest tightness, shortness of breath and wheezing.   Cardiovascular:  Negative for chest pain and palpitations.  Gastrointestinal:  Positive for abdominal pain, nausea and vomiting. Negative for blood in stool, constipation and diarrhea.  Genitourinary:  Negative for dysuria, hematuria and urgency.  Musculoskeletal:  Negative for myalgias.  Skin:  Negative for rash.  Neurological:  Negative for dizziness, weakness and light-headedness.   Physical Exam Updated Vital Signs BP 107/84   Pulse (!) 52   Temp 98.4 F (36.9 C) (Oral)   Resp 16   LMP 11/04/2020   SpO2 100%   Physical Exam Vitals and nursing note reviewed.  Constitutional:      General: She is not in acute distress.    Appearance: She is well-developed.  HENT:     Head: Normocephalic and atraumatic.     Nose: Nose normal.  Eyes:     General: No scleral icterus.       Left eye: No discharge.     Conjunctiva/sclera: Conjunctivae normal.     Comments: Bilateral eye with injected conjunctiva, no eyelid swelling or erythema or tenderness to palpation.  Mild clear tearful drainage noted.  No foreign bodies noted.  No pain with EOMs.  No chemosis, proptosis, or consensual photophobia.  Cardiovascular:     Rate and Rhythm: Normal rate and regular rhythm.     Heart sounds: Normal heart sounds. No murmur heard.   No friction rub. No gallop.  Pulmonary:     Effort: Pulmonary effort is normal. No respiratory distress.     Breath sounds: Normal breath sounds.  Abdominal:     General: Bowel sounds are normal. There is no distension.     Palpations: Abdomen is soft.     Tenderness: There is no abdominal tenderness. There is no guarding.  Musculoskeletal:        General: Normal range of motion.     Cervical back: Normal range of motion and neck supple.  Skin:    General: Skin is  warm and dry.     Findings: No rash.  Neurological:     Mental Status: She is alert.     Motor: No abnormal muscle tone.     Coordination: Coordination normal.    ED Results / Procedures / Treatments   Labs (all labs ordered are listed, but only abnormal results are displayed) Labs Reviewed  CBC  WITH DIFFERENTIAL/PLATELET - Abnormal; Notable for the following components:      Result Value   WBC 12.6 (*)    Hemoglobin 16.1 (*)    HCT 46.8 (*)    Neutro Abs 9.6 (*)    All other components within normal limits  COMPREHENSIVE METABOLIC PANEL  LIPASE, BLOOD  I-STAT BETA HCG BLOOD, ED ([facility], [facility], [facility] ONLY)    EKG None  Radiology No results found.  Procedures Procedures   Medications Ordered in ED Medications  famotidine (PEPCID) IVPB 20 mg premix (0 mg Intravenous Stopped 11/08/20 1359)  ondansetron (ZOFRAN) injection 4 mg (4 mg Intravenous Given 11/08/20 1247)  dicyclomine (BENTYL) injection 20 mg (20 mg Intramuscular Given 11/08/20 1250)    ED Course  I have reviewed the triage vital signs and the nursing notes.  Pertinent labs & imaging results that were available during my care of the patient were reviewed by me and considered in my medical decision making (see chart for details).  Clinical Course as of 11/08/20 1401  Mon Nov 08, 2020  1316 WBC(!): 12.6 [HK]  1340 Lipase: 22 [HK]  1340 I-stat hCG, quantitative: <5.0 [HK]  1340 Creatinine: 0.79 [HK]  1340 Potassium: 4.6 [HK]  1340 Sodium: 137 [HK]    Clinical Course User Index [HK] Delia Heady, PA-C   MDM Rules/Calculators/A&P                           35 year old female with past medical history of anxiety presenting to the ED for multiple complaints.  She has had bilateral eye irritation and discharge since a chemical exposure at her job a few months ago.  She has had several visits to the ER and evaluated by an ophthalmologist for this.  She was told that her exam was unremarkable other than "scarring on my optic  nerve."  She woke up this morning with increase in discharge in both of her eyes.  Denies any reinjury, vision changes, swelling around the eye, pain with EOMs or fever.  On exam there is bilateral conjunctival injection noted.  No discharge noted.  EOMs are intact. Visual acuity is at her baseline. Suspect this is due to her chronic irritation we will refer her to an ophthalmologist as she is requesting a new one.  Advised her to continue saline drops. Also complains of generalized abdominal discomfort, nausea.  Had none bloody, nonbilious emesis yesterday but none today.  No changes to bowel movements or urination.  No chest pain or shortness of breath.  On exam abdomen is soft.  Work-up here significant for leukocytosis of 12.6, she states that this is actually lower than her what her baseline leukocytosis is.  CMP is unremarkable.  Lipase is unremarkable and hCG is negative.  She was given Bentyl, Pepcid and Zofran here with resolution of her symptoms.  Repeat abdominal exams remain benign.  Suspect that her symptoms could be viral in nature.  We will have her continue antiemetics.  I doubt appendicitis, cholecystitis or other surgical emergent cause based on her reassuring work-up and physical exam findings.  Return precautions given.    Patient is hemodynamically stable, in NAD, and able to ambulate in the ED. Evaluation does not show pathology that would require ongoing emergent intervention or inpatient treatment. I explained the diagnosis to the patient. Pain has been managed and has no complaints prior to discharge. Patient is comfortable with above plan and is stable for discharge at this time.  All questions were answered prior to disposition. Strict return precautions for returning to the ED were discussed. Encouraged follow up with PCP.   An After Visit Summary was printed and given to the patient.   Portions of this note were generated with Lobbyist. Dictation errors may  occur despite best attempts at proofreading.  Final Clinical Impression(s) / ED Diagnoses Final diagnoses:  Eye irritation  Nausea    Rx / DC Orders ED Discharge Orders          Ordered    ondansetron (ZOFRAN ODT) 4 MG disintegrating tablet  Every 8 hours PRN        11/08/20 1401             Delia Heady, PA-C 11/08/20 1404    Arnaldo Natal, MD 11/08/20 1540

## 2020-11-08 NOTE — ED Triage Notes (Signed)
Patient here POV from Home with Eye Pain.  Patient states in April she had acute onset of Blurry Vision and Bleeding/Pain from the Left Eye that has since progressed into Both Eyes.   Patient has not been seen by Opthalmology. Saline and eye Washing has not been effective.  Ambulatory, GCS 15.

## 2020-11-08 NOTE — ED Provider Notes (Signed)
[place] EMERGENCY DEPT Provider Note   CSN: [id number] Arrival date & time: 11/08/20  1628     History Chief Complaint  Patient presents with   Eye Problem    Deborah Park is a 35 y.o. female.  She is here with a concern of left eye infection.  She said this is been going on for months and she attributes it to a shock sensation that she felt in the left side of her face.  She has been seen in the emergency department a few times since then without any clear diagnosis.  She was at the emergency department today at [facility] and was given ophthalmology follow-up information.  She tells me that they told her in the emergency department that she should come here for evaluation.  Complaining of on and off dry eye red eye and some clear drainage sometimes with some crusting.  No contacts or glasses.  The history is provided by the patient.  Eye Problem Location:  Left eye Quality:  Stinging and burning Onset quality:  Gradual Duration:  4 months Timing:  Intermittent Progression:  Unchanged Chronicity:  New Context: not contact lens problem and not direct trauma   Relieved by:  Nothing Worsened by:  Nothing Ineffective treatments: saline eye drops. 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[place])    Memory loss 09/17/2013   Mental disorder    Sleep disturbance 09/17/2013    Patient Active Problem List   Diagnosis Date Noted   Genetic carrier 06/05/2019   Screening for genetic disease carrier status 05/28/2019   AMA (advanced maternal age) multigravida 35+ 05/10/2019   Chiari malformation type II [facility])    Supervision of high risk pregnancy, antepartum 05/08/2019   History of preterm delivery, currently pregnant 05/08/2019   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   MDD (major depressive disorder), recurrent severe, 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    Psychoactive substance-induced psychosis ([place])    MDD (major depressive disorder), single episode, severe with psychosis ([place]) 08/29/2018   Polysubstance abuse ([place]) 02/14/2018   Memory loss 09/17/2013   Sleep disturbance 09/17/2013    Past Surgical History:  Procedure Laterality Date   KIDNEY SURGERY       OB History     Gravida  4   Para  3   Term  1   Preterm  2   AB  0   Living  2      SAB  0   IAB      Ectopic      Multiple  0   Live Births  2           Family History  Problem Relation Age of Onset   Healthy Mother    Healthy Father    Bipolar disorder Maternal Grandmother    Seizures Neg Hx    Parkinsonism Neg Hx    Multiple sclerosis Neg Hx    Dementia Neg Hx     Social History   Tobacco Use   Smoking status: Every Day    Packs/day: 0.50    Years: 15.00    Pack years: 7.50    Types: Cigarettes   Smokeless tobacco: Never   Tobacco comments:    10 cigs/day  Vaping Use   Vaping Use: Never used  Substance Use Topics   Alcohol use: Not Currently   Drug use: Yes  Types: Amphetamines, Cocaine    Comment: last report 08 Feb 2018. pt reports none in preg    Home Medications Prior to Admission medications   Medication Sig Start Date End Date Taking? Authorizing Provider  Blood Pressure Monitoring (BLOOD PRESSURE KIT) DEVI 1 kit by Does not apply route once a week. Check Blood Pressure regularly and record readings into the [employer] App.  Large Cuff.  DX O90.0 06/05/19   Woodroe Mode, MD  ondansetron (ZOFRAN ODT) 4 MG disintegrating tablet Take 1 tablet (4 mg total) by mouth every 8 (eight) hours as needed for nausea or vomiting. 11/08/20   Khatri, Hina, PA-C  Prenat-FeAsp-Meth-FA-DHA w/o A (PRENATE PIXIE) 10-0.6-0.4-200 MG CAPS Take 1 tablet by mouth daily. 05/08/19   Woodroe Mode, MD  sodium chloride (OCEAN) 0.65 % SOLN nasal spray Place 1 spray into both nostrils as needed for congestion. 08/11/20   Carmin Muskrat, MD     Allergies    Patient has no known allergies.  Review of Systems   Review of Systems  Constitutional:  Negative for fever.  HENT:  Negative for sore throat.   Eyes:  Positive for redness.   Physical Exam Updated Vital Signs BP 115/63 (BP Location: Right Arm)   Pulse 88   Temp 98.3 F (36.8 C) (Oral)   Resp 16   Ht '5\' 2"'  (1.575 m)   Wt 52.6 kg   LMP 11/04/2020   SpO2 100%   BMI 21.22 kg/m   Physical Exam Vitals and nursing note reviewed.  Constitutional:      Appearance: Normal appearance. She is well-developed.  HENT:     Head: Normocephalic and atraumatic.     Nose: Nose normal.     Mouth/Throat:     Mouth: Mucous membranes are moist.     Pharynx: Oropharynx is clear.  Eyes:     General: Lids are normal. Vision grossly intact. Gaze aligned appropriately. No scleral icterus.    Extraocular Movements: Extraocular movements intact.     Conjunctiva/sclera: Conjunctivae normal.     Right eye: Right conjunctiva is not injected.     Left eye: Left conjunctiva is not injected.     Pupils: Pupils are equal, round, and reactive to light.     Funduscopic exam:    Right eye: No hemorrhage.        Left eye: No hemorrhage.     Slit lamp exam:    Left eye: Anterior chamber quiet.  Musculoskeletal:     Cervical back: Neck supple.  Skin:    General: Skin is warm and dry.  Neurological:     General: No focal deficit present.     Mental Status: She is alert.     GCS: GCS eye subscore is 4. GCS verbal subscore is 5. GCS motor subscore is 6.    ED Results / Procedures / Treatments   Labs (all labs ordered are listed, but only abnormal results are displayed) Labs Reviewed - No data to display  EKG None  Radiology No results found.  Procedures Procedures   Medications Ordered in ED Medications - No data to display  ED Course  I have reviewed the triage vital signs and the nursing notes.  Pertinent labs & imaging results that were available during my care of  the patient were reviewed by me and considered in my medical decision making (see chart for details).    MDM Rules/Calculators/A&P  Patient here for evaluation of left eye discharge and concern for infection.  Patient was just seen earlier today.  She was given outpatient follow-up information for ophthalmology.  Sounds like she is already seen an ophthalmologist in the past and has already been cleared for any significant etiologies.  Patient is adamant that she needs antibiotics for treatment of an infection.  Will cover with some topical erythromycin and recommended follow-up with Dr. Posey Pronto and ophthalmology.  Return instructions discussed  Final Clinical Impression(s) / ED Diagnoses Final diagnoses:  Discomfort of left eye    Rx / DC Orders ED Discharge Orders          Ordered    erythromycin ophthalmic ointment        11/08/20 1909             Hayden Rasmussen, MD 11/09/20 [phone]

## 2020-12-09 ENCOUNTER — Emergency Department
Admission: EM | Admit: 2020-12-09 | Discharge: 2020-12-09 | Disposition: A | Discharge status: Home / Self Care | Payer: Medicaid Other | Attending: Emergency Medicine | Admitting: Emergency Medicine

## 2020-12-09 ENCOUNTER — Other Ambulatory Visit: Payer: Self-pay

## 2020-12-09 DIAGNOSIS — F17210 Nicotine dependence, cigarettes, uncomplicated: Secondary | ICD-10-CM | POA: Insufficient documentation

## 2020-12-09 DIAGNOSIS — H109 Unspecified conjunctivitis: Secondary | ICD-10-CM | POA: Insufficient documentation

## 2020-12-09 DIAGNOSIS — H5789 Other specified disorders of eye and adnexa: Secondary | ICD-10-CM | POA: Diagnosis present

## 2020-12-09 MED ORDER — MOXIFLOXACIN HCL 0.5 % OP SOLN: 1.0000 [drp] | Freq: Three times a day (TID) | OPHTHALMIC | 0 refills | Status: AC

## 2020-12-09 MED ORDER — TETRACAINE HCL 0.5 % OP SOLN
2.0000 [drp] | Freq: Once | OPHTHALMIC | Status: AC
  Administered 2020-12-09: 2 [drp] via OPHTHALMIC
  Filled 2020-12-09: qty 4

## 2020-12-09 MED ORDER — KETOROLAC TROMETHAMINE 0.5 % OP SOLN: 1.0000 [drp] | Freq: Four times a day (QID) | OPHTHALMIC | 0 refills | Status: DC

## 2020-12-09 MED ORDER — FLUORESCEIN SODIUM 1 MG OP STRP
1.0000 | ORAL_STRIP | Freq: Once | OPHTHALMIC | Status: AC
  Administered 2020-12-09: 1 via OPHTHALMIC
  Filled 2020-12-09: qty 1

## 2020-12-09 NOTE — Discharge Instructions (Addendum)
Use the eye medications as prescribed.  Follow-up with [facility] for ongoing symptoms.

## 2020-12-09 NOTE — ED Provider Notes (Signed)
[facility] Emergency Department Provider Note ____________________________________________  Time seen: 1215  I have reviewed the triage vital signs and the nursing notes.  HISTORY  Chief Complaint  Eye Pain   HPI Deborah Park is a 35 y.o. female presents herself to the ED for evaluation of several days of recent eye irritation and purulent drainage.  She has reported intermittent symptoms over the last several months, with a protracted story about an abrasion over a work-related injury.  She presents today for evaluation of ongoing eye irritation and sensation of a "film across my eyes."  Denies any excessive tearing, vision loss, nausea, vomiting, or dizziness.  She has been using over-the-counter saline drops and eyedrops without significant benefit.  Patient has not had a recent visit with an optometrist or ophthalmologist as suggested.  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 Authorizing Provider  ketorolac (ACULAR) 0.5 % ophthalmic solution Place 1 drop into both eyes 4 (four) times daily. 12/09/20  Yes Gar Glance, Dannielle Karvonen, PA-C  moxifloxacin (VIGAMOX) 0.5 % ophthalmic solution Place 1 drop into both eyes 3 (three) times daily for 7 days. 12/09/20 12/16/20 Yes Gabryella Murfin, Dannielle Karvonen, PA-C  Blood Pressure Monitoring (BLOOD PRESSURE KIT) DEVI 1 kit by Does not apply route once a week. Check Blood Pressure regularly and record readings into the [employer] App.  Large Cuff.  DX O90.0 06/05/19   Woodroe Mode, MD  erythromycin ophthalmic ointment Place a 1/2 inch ribbon of ointment into the lower eyelid 3 times a day 11/08/20   Hayden Rasmussen, MD  ondansetron (ZOFRAN ODT) 4 MG disintegrating tablet Take 1 tablet (4 mg total) by mouth every 8 (eight) hours as needed for nausea or vomiting. 11/08/20   Khatri, Hina, PA-C  Prenat-FeAsp-Meth-FA-DHA w/o A (PRENATE PIXIE) 10-0.6-0.4-200 MG CAPS Take 1 tablet by mouth daily. 05/08/19   Woodroe Mode, MD  sodium chloride (OCEAN) 0.65 % SOLN nasal spray Place 1 spray into both nostrils as needed for congestion. 08/11/20   Carmin Muskrat, MD    Allergies Patient has no known allergies.  Family History  Problem Relation Age of Onset   Healthy Mother    Healthy Father    Bipolar disorder Maternal Grandmother    Seizures Neg Hx    Parkinsonism Neg Hx    Multiple sclerosis Neg Hx    Dementia Neg Hx     Social History Social History   Tobacco Use   Smoking status: Every  Day    Packs/day: 0.50    Years: 15.00    Pack years: 7.50    Types: Cigarettes   Smokeless tobacco: Never   Tobacco comments:    10 cigs/day  Vaping Use   Vaping Use: Never used  Substance Use Topics   Alcohol use: Not Currently   Drug use: Yes    Types: Amphetamines, Cocaine    Comment: last report 08 Feb 2018. pt reports none in preg    Review of Systems  Constitutional: Negative for fever. Eyes: Negative for visual changes.   Bilateral eye irritation and drainage as above. ENT: Negative for sore throat. Cardiovascular: Negative for chest pain. Respiratory: Negative for shortness of breath. Gastrointestinal: Negative for abdominal pain, vomiting and diarrhea. Genitourinary: Negative for dysuria. Musculoskeletal: Negative for back pain. Skin: Negative for rash. Neurological: Negative for headaches, focal weakness or numbness. ____________________________________________  PHYSICAL EXAM:  VITAL SIGNS: ED Triage Vitals  Enc Vitals Group     BP 12/09/20 1128 97/72     Pulse Rate 12/09/20 1128 84     Resp 12/09/20 1128 18     Temp 12/09/20 1128 98.3 F (36.8 C)     Temp Source 12/09/20 1128 Oral     SpO2 12/09/20 1128 98 %     Weight 12/09/20 1129 120 lb (54.4 kg)     Height 12/09/20 1129 _0  (1.575 m)     Head Circumference --      Peak Flow --      Pain Score 12/09/20 1128 7     Pain Loc --      Pain Edu? --      Excl. in [place]? --     Constitutional: Alert and oriented. Well appearing and in no distress. Head: Normocephalic and atraumatic. Eyes: Conjunctivae are injected bilaterally. PERRL. Normal extraocular movements and fundi bilaterally.  Patient with purulent conjunctival material noted with lid eversion. Hematological/Lymphatic/Immunological: No preauricular lymphadenopathy. Cardiovascular: Normal rate, regular rhythm. Normal distal pulses. Respiratory: Normal respiratory effort. No wheezes/rales/rhonchi. Gastrointestinal: Soft and nontender. No distention. Musculoskeletal: Nontender with normal range of motion in all extremities.  Neurologic:  Normal gait without ataxia. Normal speech and language. No gross focal neurologic deficits are appreciated. Skin:  Skin is warm, dry and intact. No rash noted. Psychiatric: Mood and affect are normal. Patient exhibits appropriate insight and judgment. ____________________________________________    {LABS (pertinent  positives/negatives)  ____________________________________________  {EKG  ____________________________________________   RADIOLOGY Official radiology report(s): No results found. ____________________________________________  PROCEDURES   Procedures ____________________________________________   INITIAL IMPRESSION / ASSESSMENT AND PLAN / ED COURSE  As part of my medical decision making, I reviewed the following data within the electronic MEDICAL RECORD NUMBER Notes from prior ED visits    DDX: conjunctivitis, corneal abrasion, corneal FB  Patient with ED evaluation of bilateral eye irritation and purulent drainage.  She is evaluated for complaints in the ED, found to have bilateral bacterial conjunctivitis patient was treated empirically with moxifloxacin drops as well as Acular for irritation.  She is referred to [place] [facility] for ongoing symptom agement per return precautions of been reviewed.   Bodhi A Voyles was evaluated in Emergency Department on 12/09/2020 for the symptoms described in the history of present illness. She was evaluated in the context of the global COVID-19 pandemic, which necessitated consideration that the patient might be at risk for infection with the SARS-CoV-2 virus that causes COVID-19. Institutional protocols and algorithms that pertain to the evaluation of patients at risk  for COVID-19 are in a state of rapid change based on information released by regulatory bodies including the CDC and federal and state organizations. These policies and algorithms were followed during the patient's care in the ED. ____________________________________________  FINAL CLINICAL IMPRESSION(S) / ED DIAGNOSES  Final diagnoses:  Conjunctivitis of both eyes, unspecified conjunctivitis type      Melvenia Needles, PA-C 12/09/20 1617    Carrie Mew, MD 12/10/20 2339

## 2020-12-09 NOTE — ED Triage Notes (Signed)
Pt here with eye pain. Pt states that she had a injury at work a month ago but has been having issues for a while. Pt states that they burn and they have a "film across them" Pt has been taking drops, ointment, and saline solution that has not helped.

## 2020-12-10 ENCOUNTER — Other Ambulatory Visit (HOSPITAL_COMMUNITY): Payer: Self-pay | Admitting: Student

## 2020-12-10 ENCOUNTER — Emergency Department (HOSPITAL_COMMUNITY)
Admission: EM | Admit: 2020-12-10 | Discharge: 2020-12-11 | Disposition: A | Discharge status: Home / Self Care | Payer: BLUE CROSS/BLUE SHIELD | Attending: Emergency Medicine | Admitting: Emergency Medicine

## 2020-12-10 DIAGNOSIS — H5710 Ocular pain, unspecified eye: Secondary | ICD-10-CM | POA: Diagnosis present

## 2020-12-10 DIAGNOSIS — F17210 Nicotine dependence, cigarettes, uncomplicated: Secondary | ICD-10-CM | POA: Diagnosis not present

## 2020-12-10 MED ORDER — FLUORESCEIN SODIUM 1 MG OP STRP
1.0000 | ORAL_STRIP | Freq: Once | OPHTHALMIC | Status: AC
  Administered 2020-12-11: 1 via OPHTHALMIC
  Filled 2020-12-10: qty 1

## 2020-12-10 MED ORDER — TETRACAINE HCL 0.5 % OP SOLN
1.0000 [drp] | Freq: Once | OPHTHALMIC | Status: AC
  Administered 2020-12-11: 1 [drp] via OPHTHALMIC
  Filled 2020-12-10: qty 4

## 2020-12-10 NOTE — ED Triage Notes (Signed)
Pt came in with c/o L eye pain that started approx 15 min prior to arrival. Pt had injury to L eye previously. Pt feels as if she has a thorn in her eye. Eye is erythematous. No watering, pain comes and goes.

## 2020-12-11 ENCOUNTER — Other Ambulatory Visit (HOSPITAL_COMMUNITY): Payer: BLUE CROSS/BLUE SHIELD

## 2020-12-11 ENCOUNTER — Emergency Department (HOSPITAL_COMMUNITY): Payer: BLUE CROSS/BLUE SHIELD

## 2020-12-11 DIAGNOSIS — H5710 Ocular pain, unspecified eye: Secondary | ICD-10-CM | POA: Diagnosis not present

## 2020-12-11 NOTE — ED Provider Notes (Addendum)
[facility] DEPT Provider Note   CSN: [id number] Arrival date & time: 12/10/20  2106     History Chief Complaint  Patient presents with   Eye Pain    Deborah Park is a 35 y.o. female.  The history is provided by the patient.  Eye Pain This is a new problem. The current episode started more than 1 week ago. The problem occurs constantly. The problem has not changed since onset.Pertinent negatives include no chest pain, no abdominal pain, no headaches and no shortness of breath. Nothing aggravates the symptoms. Relieved by: eyedrops given at [facility] today. Treatments tried: toradol gtt and vigamox. The treatment provided significant relief.  Also reports something feeling stuck in the throat.  No trauma.  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   MDD (major depressive disorder), recurrent severe, 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   Psychoactive substance-induced psychosis ([place])    MDD (major depressive disorder), single episode, severe with psychosis ([place]) 08/29/2018   Polysubstance abuse ([place]) 02/14/2018   Memory loss 09/17/2013   Sleep disturbance 09/17/2013    Past Surgical History:  Procedure Laterality Date   KIDNEY SURGERY       OB History     Gravida  4   Para  3   Term  1   Preterm  2   AB  0   Living  2      SAB  0   IAB      Ectopic      Multiple  0   Live Births  2            Family History  Problem Relation Age of Onset   Healthy Mother    Healthy Father    Bipolar disorder Maternal Grandmother    Seizures Neg Hx    Parkinsonism Neg Hx    Multiple sclerosis Neg Hx    Dementia Neg Hx     Social History   Tobacco Use   Smoking status: Every Day    Packs/day: 0.50    Years: 15.00    Pack years: 7.50    Types: Cigarettes   Smokeless tobacco: Never   Tobacco comments:    10 cigs/day  Vaping Use   Vaping Use: Never used  Substance Use Topics   Alcohol use: Not Currently   Drug use: Yes    Types: Amphetamines, Cocaine    Comment: last report 08 Feb 2018. pt reports none in preg    Home Medications Prior to Admission medications   Medication Sig Start Date End Date Taking? Authorizing Provider  Blood Pressure Monitoring (BLOOD PRESSURE KIT) DEVI 1 kit by Does not apply route once a week. Check Blood Pressure regularly and record readings into the Babys[employer] App.  Large Cuff.  DX O90.0 06/05/19   Woodroe Mode, MD  erythromycin ophthalmic ointment Place a 1/2 inch ribbon of ointment into the lower eyelid  3 times a day 11/08/20   Hayden Rasmussen, MD  ketorolac (ACULAR) 0.5 % ophthalmic solution Place 1 drop into both eyes 4 (four) times daily. 12/09/20   Menshew, Dannielle Karvonen, PA-C  moxifloxacin (VIGAMOX) 0.5 % ophthalmic solution Place 1 drop into both eyes 3 (three) times daily for 7 days. 12/09/20 12/16/20  Menshew, Dannielle Karvonen, PA-C  ondansetron (ZOFRAN ODT) 4 MG disintegrating tablet Take 1 tablet (4 mg total) by mouth every 8 (eight) hours as needed for nausea or vomiting. 11/08/20   Khatri, Hina, PA-C  Prenat-FeAsp-Meth-FA-DHA w/o A (PRENATE PIXIE) 10-0.6-0.4-200 MG CAPS Take 1 tablet by mouth daily. 05/08/19   Woodroe Mode, MD  sodium chloride (OCEAN) 0.65 % SOLN nasal spray Place 1 spray into both nostrils as needed for congestion. 08/11/20   Carmin Muskrat, MD    Allergies    Patient has no known allergies.  Review of Systems   Review  of Systems  Constitutional:  Negative for fever.  HENT:  Negative for drooling, facial swelling, sore throat, trouble swallowing and voice change.   Eyes:  Positive for pain and redness.  Respiratory:  Negative for shortness of breath.   Cardiovascular:  Negative for chest pain.  Gastrointestinal:  Negative for abdominal pain.  Genitourinary:  Negative for difficulty urinating.  Neurological:  Negative for headaches.  Psychiatric/Behavioral:  Negative for agitation.   All other systems reviewed and are negative.  Physical Exam Updated Vital Signs BP (!) 129/93   Pulse 95   Temp 98.4 F (36.9 C) (Oral)   Resp 18   Ht _0  (1.575 m)   Wt 54.4 kg   SpO2 100%   BMI 21.95 kg/m   Physical Exam Vitals and nursing note reviewed.  Constitutional:      General: She is not in acute distress.    Appearance: Normal appearance.  HENT:     Head: Normocephalic and atraumatic.     Nose: Nose normal.     Mouth/Throat:     Mouth: Mucous membranes are moist.     Pharynx: Oropharynx is clear. No oropharyngeal exudate or posterior oropharyngeal erythema.  Eyes:     Extraocular Movements: Extraocular movements intact.     Conjunctiva/sclera: Conjunctivae normal.     Pupils: Pupils are equal, round, and reactive to light.     Right eye: Pupil is not sluggish.     Left eye: Pupil is not sluggish.     Slit lamp exam:    Right eye: No corneal flare, corneal ulcer or foreign body.     Left eye: No corneal flare, corneal ulcer or foreign body.  Cardiovascular:     Rate and Rhythm: Normal rate and regular rhythm.     Pulses: Normal pulses.     Heart sounds: Normal heart sounds.  Pulmonary:     Effort: Pulmonary effort is normal.     Breath sounds: Normal breath sounds. No stridor.  Abdominal:     General: Abdomen is flat. Bowel sounds are normal.     Palpations: Abdomen is soft.     Tenderness: There is no abdominal tenderness. There is no guarding.  Musculoskeletal:        General:  Normal range of motion.     Cervical back: Normal range of motion and neck supple. No rigidity or tenderness.  Lymphadenopathy:     Cervical: No cervical adenopathy.  Skin:    General: Skin is warm and dry.     Capillary Refill: Capillary  refill takes less than 2 seconds.  Neurological:     General: No focal deficit present.     Mental Status: She is alert and oriented to person, place, and time.     Deep Tendon Reflexes: Reflexes normal.  Psychiatric:        Mood and Affect: Mood normal.    ED Results / Procedures / Treatments   Labs (all labs ordered are listed, but only abnormal results are displayed) Labs Reviewed - No data to display  EKG None  Radiology DG Neck Soft Tissue  Result Date: 12/11/2020 CLINICAL DATA:  Neck pain. EXAM: NECK SOFT TISSUES - 1+ VIEW COMPARISON:  None. FINDINGS: There is no evidence of retropharyngeal soft tissue swelling or epiglottic enlargement. The cervical airway is unremarkable and no radio-opaque foreign body identified. IMPRESSION: Negative. Electronically Signed   By: Anner Crete M.D.   On: 12/11/2020 00:31    Procedures Procedures   Medications Ordered in ED Medications  fluorescein ophthalmic strip 1 strip (has no administration in time range)  tetracaine (PONTOCAINE) 0.5 % ophthalmic solution 1 drop (has no administration in time range)    ED Course  I have reviewed the triage vital signs and the nursing notes.  Pertinent labs & imaging results that were available during my care of the patient were reviewed by me and considered in my medical decision making (see chart for details).    MDM Rules/Calculators/A&P                          Throat is normal.  I suspect this is all part of the polysubstance abuse symptoms.  NO SI or HI,  stable for discharge.    Deborah Park was evaluated in Emergency Department on 12/11/2020 for the symptoms described in the history of present illness. She was evaluated in the context of the  global COVID-19 pandemic, which necessitated consideration that the patient might be at risk for infection with the SARS-CoV-2 virus that causes COVID-19. Institutional protocols and algorithms that pertain to the evaluation of patients at risk for COVID-19 are in a state of rapid change based on information released by regulatory bodies including the CDC and federal and state organizations. These policies and algorithms were followed during the patient's care in the ED.  Final Clinical Impression(s) / ED Diagnoses Final diagnoses:  None   Take all antibiotic drops as previously recommended.  Follow up with ophthalmology.       Return for intractable cough, coughing up blood, fevers > 100.4 unrelieved by medication, shortness of breath, intractable vomiting, chest pain, shortness of breath, weakness, numbness, changes in speech, facial asymmetry, abdominal pain, passing out, Inability to tolerate liquids or food, cough, altered mental status or any concerns. No signs of systemic illness or infection. The patient is nontoxic-appearing on exam and vital signs are within normal limits. I have reviewed the triage vital signs and the nursing notes. Pertinent labs & imaging results that were available during my care of the patient were reviewed by me and considered in my medical decision making (see chart for details). After history, exam, and medical workup I feel the patient has been appropriately medically screened and is safe for discharge home. Pertinent diagnoses were discussed with the patient. Patient was given return precautions.      Rx / DC Orders ED Discharge Orders     None        Tyray Proch, MD 12/11/20 0116  Giamarie Bueche, MD 12/11/20 [phone]

## 2020-12-20 ENCOUNTER — Other Ambulatory Visit: Payer: Self-pay

## 2020-12-20 ENCOUNTER — Emergency Department
Admission: EM | Admit: 2020-12-20 | Discharge: 2020-12-20 | Disposition: A | Discharge status: Home / Self Care | Payer: BLUE CROSS/BLUE SHIELD | Attending: Emergency Medicine | Admitting: Emergency Medicine

## 2020-12-20 ENCOUNTER — Emergency Department: Payer: BLUE CROSS/BLUE SHIELD

## 2020-12-20 DIAGNOSIS — Z79899 Other long term (current) drug therapy: Secondary | ICD-10-CM | POA: Diagnosis not present

## 2020-12-20 DIAGNOSIS — Z139 Encounter for screening, unspecified: Secondary | ICD-10-CM | POA: Insufficient documentation

## 2020-12-20 DIAGNOSIS — F17210 Nicotine dependence, cigarettes, uncomplicated: Secondary | ICD-10-CM | POA: Insufficient documentation

## 2020-12-20 DIAGNOSIS — F22 Delusional disorders: Secondary | ICD-10-CM | POA: Diagnosis not present

## 2020-12-20 LAB — CBC
HCT: 45.2 % (ref 36.0–46.0)
Hemoglobin: 15.8 g/dL — ABNORMAL HIGH (ref 12.0–15.0)
MCH: 33.1 pg (ref 26.0–34.0)
MCHC: 35 g/dL (ref 30.0–36.0)
MCV: 94.6 fL (ref 80.0–100.0)
Platelets: 347 10*3/uL (ref 150–400)
RBC: 4.78 MIL/uL (ref 3.87–5.11)
RDW: 12 % (ref 11.5–15.5)
WBC: 10.6 10*3/uL — ABNORMAL HIGH (ref 4.0–10.5)
nRBC: 0 % (ref 0.0–0.2)

## 2020-12-20 LAB — URINE DRUG SCREEN, QUALITATIVE (ARMC ONLY)
Amphetamines, Ur Screen: POSITIVE — AB
Barbiturates, Ur Screen: NOT DETECTED
Benzodiazepine, Ur Scrn: NOT DETECTED
Cannabinoid 50 Ng, Ur ~~LOC~~: NOT DETECTED
Cocaine Metabolite,Ur ~~LOC~~: POSITIVE — AB
MDMA (Ecstasy)Ur Screen: NOT DETECTED
Methadone Scn, Ur: NOT DETECTED
Opiate, Ur Screen: NOT DETECTED
Phencyclidine (PCP) Ur S: NOT DETECTED
Tricyclic, Ur Screen: NOT DETECTED

## 2020-12-20 LAB — BASIC METABOLIC PANEL
Anion gap: 9 (ref 5–15)
BUN: 15 mg/dL (ref 6–20)
CO2: 23 mmol/L (ref 22–32)
Calcium: 9.4 mg/dL (ref 8.9–10.3)
Chloride: 103 mmol/L (ref 98–111)
Creatinine, Ser: 0.63 mg/dL (ref 0.44–1.00)
GFR, Estimated: >60 mL/min (ref >60)
Glucose, Bld: 126 mg/dL — ABNORMAL HIGH (ref 70–99)
Potassium: 3.8 mmol/L (ref 3.5–5.1)
Sodium: 135 mmol/L (ref 135–145)

## 2020-12-20 LAB — POC URINE PREG, ED: Preg Test, Ur: NEGATIVE

## 2020-12-20 LAB — ETHANOL: Alcohol, Ethyl (B): <10 mg/dL (ref <10)

## 2020-12-20 LAB — TROPONIN I (HIGH SENSITIVITY)
Troponin I (High Sensitivity): 11 ng/L (ref <18)
Troponin I (High Sensitivity): 7 ng/L (ref <18)

## 2020-12-20 MED ORDER — ACETAMINOPHEN 325 MG PO TABS: 650.0000 mg | ORAL_TABLET | Freq: Once | ORAL | Status: DC

## 2020-12-20 NOTE — ED Provider Notes (Signed)
[facility]  ____________________________________________   Event Date/Time   First MD Initiated Contact with Patient 12/20/20 1608     (approximate)  I have reviewed the triage vital signs and the nursing notes.   HISTORY  Chief Complaint Chest Pain    HPI Deborah Park is a 35 y.o. female with past medical history of polysubstance use, chiari malformation, anxiety who presents with concern for there being a contaminant in her body.  Patient tells me that she thinks that there is an FBI agent inside of her.  She goes on to describe a Black [place]oot in his contaminating her.  She thinks that everything was exacerbated by her having company last night.  Otherwise she endorses some right upper quadrant abdominal pain that is now resolved.  She denies chest pain, fevers chills.  No nausea vomiting.  She is eating and drinking fine.  Patient has a job.  She lives alone and is functioning on her own.  She does endorse cocaine and amphetamine use, not today.  She tells me that she needs to do a lot of physical and mental health healing but does not elaborate.  She is not currently seeing a psychiatrist.         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 Authorizing Provider  Blood Pressure Monitoring (BLOOD PRESSURE KIT) DEVI 1 kit by Does not apply route once a week. Check Blood Pressure regularly and record readings into the [employer] App.  Large Cuff.  DX O90.0 06/05/19   Woodroe Mode, MD  erythromycin ophthalmic ointment Place a 1/2 inch ribbon of ointment into the lower eyelid 3 times a day 11/08/20   Hayden Rasmussen, MD  ketorolac (ACULAR) 0.5 % ophthalmic solution Place 1 drop into both eyes 4 (four) times daily. 12/09/20   Menshew, Dannielle Karvonen, PA-C  ondansetron (ZOFRAN ODT) 4 MG disintegrating tablet Take 1 tablet (4 mg total) by mouth every 8 (eight) hours as needed for nausea or vomiting. 11/08/20   Khatri, Hina, PA-C  Prenat-FeAsp-Meth-FA-DHA w/o A (PRENATE PIXIE) 10-0.6-0.4-200 MG CAPS Take 1 tablet by mouth daily. 05/08/19   Woodroe Mode, MD  sodium chloride (OCEAN) 0.65 % SOLN nasal spray Place 1 spray into both nostrils as needed for congestion. 08/11/20   Carmin Muskrat, MD    Allergies Patient has no known allergies.  Family History  Problem Relation Age of Onset   Healthy Mother    Healthy Father  Bipolar disorder Maternal Grandmother    Seizures Neg Hx    Parkinsonism Neg Hx    Multiple sclerosis Neg Hx    Dementia Neg Hx     Social History Social History   Tobacco Use   Smoking status: Every Day    Packs/day: 0.50    Years: 15.00    Pack years: 7.50    Types: Cigarettes   Smokeless tobacco: Never   Tobacco comments:    10 cigs/day  Vaping Use   Vaping Use: Never used  Substance Use Topics   Alcohol use: Not Currently   Drug use: Yes    Types: Amphetamines, Cocaine    Comment: last report 08 Feb 2018. pt reports none in  preg    Review of Systems   Review of Systems  Constitutional:  Negative for chills and fever.  Respiratory:  Negative for shortness of breath.   Gastrointestinal:  Negative for abdominal pain, nausea and vomiting.  All other systems reviewed and are negative.  Physical Exam Updated Vital Signs BP 109/89   Pulse (!) 107   Temp 98.6 F (37 C) (Oral)   Resp 20   Ht '5\' 2"'  (1.575 m)   Wt 55 kg   LMP 11/21/2020 (Exact Date)   SpO2 98%   BMI 22.18 kg/m   Physical Exam Vitals and nursing note reviewed.  Constitutional:      General: She is not in acute distress.    Appearance: Normal appearance.  HENT:     Head: Normocephalic and atraumatic.  Eyes:     General: No scleral icterus.    Conjunctiva/sclera: Conjunctivae normal.  Pulmonary:     Effort: Pulmonary effort is normal. No respiratory distress.     Breath sounds: No stridor.  Abdominal:     Palpations: Abdomen is soft.     Tenderness: There is no abdominal tenderness. There is no guarding.  Musculoskeletal:        General: No deformity or signs of injury.     Cervical back: Normal range of motion.  Skin:    General: Skin is dry.     Coloration: Skin is not jaundiced or pale.  Neurological:     General: No focal deficit present.     Mental Status: She is alert and oriented to person, place, and time. Mental status is at baseline.  Psychiatric:     Comments: Patient is pleasant, she is not disheveled, appears clean and put together Normal speech Delusional thinking     LABS (all labs ordered are listed, but only abnormal results are displayed)  Labs Reviewed  BASIC METABOLIC PANEL - Abnormal; Notable for the following components:      Result Value   Glucose, Bld 126 (*)    All other components within normal limits  CBC - Abnormal; Notable for the following components:   WBC 10.6 (*)    Hemoglobin 15.8 (*)    All other components within normal limits  URINE DRUG SCREEN, QUALITATIVE ([facility] ONLY) -  Abnormal; Notable for the following components:   Amphetamines, Ur Screen POSITIVE (*)    Cocaine Metabolite,Ur [place] POSITIVE (*)    All other components within normal limits  ETHANOL  POC URINE PREG, ED  TROPONIN I (HIGH SENSITIVITY)  TROPONIN I (HIGH SENSITIVITY)   ____________________________________________  EKG  Sinus tachycardia, right axis deviation, normal intervals, no acute ischemic changes ____________________________________________  RADIOLOGY Almeta Monas, personally viewed and evaluated these images (plain radiographs) as part of my  medical decision making, as well as reviewing the written report by the radiologist.  ED MD interpretation: Reviewed the chest x-ray which does not show any acute cardiopulmonary process    ____________________________________________   PROCEDURES  Procedure(s) performed (including Critical Care):  Procedures   ____________________________________________   INITIAL IMPRESSION / ASSESSMENT AND PLAN / ED COURSE     35 year old female presents with delusions.  Patient is calm and cooperative, put together but she has significant delusional thinking.  Thinks that she is contaminated with black soot.  She has no SI or HI.  Labs and chest x-ray were obtained.  Her UDS is positive for cocaine and amphetamines which is likely contributing to her psychosis.  Patient is not interested in speaking to a psychiatrist at this time.  She meets no criteria for inpatient psych does not need to be committed she is not a danger to others and is clearly able to take care of herself.  I provided her with outpatient resources.  No other medical indication for work-up.      ____________________________________________   FINAL CLINICAL IMPRESSION(S) / ED DIAGNOSES  Final diagnoses:  Encounter for medical screening examination     ED Discharge Orders     None        Note:  This document was prepared using Dragon voice recognition  software and may include unintentional dictation errors.    Rada Hay, MD 12/20/20 1726

## 2020-12-20 NOTE — ED Triage Notes (Signed)
Patient c/o right-sided chest pain. Patient states: "I've been in a process with sharing an eye with a federal agent for a while. I think someone is dropping something in my eye. I'd like to get my blood and urine tested.".

## 2020-12-20 NOTE — Discharge Instructions (Signed)
Please follow-up with a psychiatrist for further care.

## 2020-12-27 ENCOUNTER — Encounter (HOSPITAL_COMMUNITY): Payer: Self-pay | Admitting: Emergency Medicine

## 2020-12-27 ENCOUNTER — Emergency Department (HOSPITAL_COMMUNITY)
Admission: EM | Admit: 2020-12-27 | Discharge: 2020-12-27 | Discharge status: Against Medical Advice | Payer: BLUE CROSS/BLUE SHIELD | Attending: Physician Assistant | Admitting: Physician Assistant

## 2020-12-27 ENCOUNTER — Other Ambulatory Visit: Payer: Self-pay

## 2020-12-27 DIAGNOSIS — Z5321 Procedure and treatment not carried out due to patient leaving prior to being seen by health care provider: Secondary | ICD-10-CM | POA: Insufficient documentation

## 2020-12-27 DIAGNOSIS — H5713 Ocular pain, bilateral: Secondary | ICD-10-CM | POA: Diagnosis present

## 2020-12-27 MED ORDER — TETRACAINE HCL 0.5 % OP SOLN: 2.0000 [drp] | Freq: Once | OPHTHALMIC | Status: DC

## 2020-12-27 MED ORDER — FLUORESCEIN SODIUM 1 MG OP STRP: 1.0000 | ORAL_STRIP | Freq: Once | OPHTHALMIC | Status: DC

## 2020-12-27 NOTE — ED Notes (Signed)
Pt called 2x no answer 

## 2020-12-27 NOTE — ED Provider Notes (Signed)
Emergency Medicine Provider Triage Evaluation Note  [name] , a 35 y.o. female  was evaluated in triage.  Pt complains of burning to the bilat eyes for several years. She states that a probe flew in her eye when she was using her friends phone a year ago and recently she was writing down an email address and she felt a sharp pain in her eye. She thinks that something attached to the probe at that time and she can feel it moving her eyes in her head.  Seen multiple times for same. Hx cocaine and methamphetamine use.  Review of Systems  Positive: Eye discomfort Negative: Vision changes  Physical Exam  BP (!) 147/113 (BP Location: Right Arm)   Pulse (!) 111   Temp 98.9 F (37.2 C) (Oral)   Resp 16   SpO2 97%  Gen:   Awake, no distress   Resp:  Normal effort  MSK:   Moves extremities without difficulty   Medical Decision Making  Medically screening exam initiated at 5:11 PM.  Appropriate orders placed.  Lillianne A Leatherwood was informed that the remainder of the evaluation will be completed by another provider, this initial triage assessment does not replace that evaluation, and the importance of remaining in the ED until their evaluation is complete.     Karrie Meres, PA-C 12/27/20 1726    Milagros Loll, MD 12/28/20 [phone]

## 2020-12-27 NOTE — ED Triage Notes (Signed)
Patient complains of eye pain and possible eye infection intermittently for the last year. Patient states approximately one month ago she felt something sharp "fly into her eye" while writing an email address that she had seen on multiple people's phones around her. Patient states she has two probes behind her eyes looking outward.

## 2020-12-27 NOTE — ED Notes (Signed)
Pt called 1x no answer 

## 2020-12-30 ENCOUNTER — Encounter (HOSPITAL_COMMUNITY): Payer: Self-pay

## 2020-12-30 ENCOUNTER — Emergency Department (HOSPITAL_COMMUNITY): Payer: BLUE CROSS/BLUE SHIELD

## 2020-12-30 ENCOUNTER — Emergency Department (HOSPITAL_COMMUNITY)
Admission: EM | Admit: 2020-12-30 | Discharge: 2020-12-31 | Disposition: A | Discharge status: Home / Self Care | Payer: BLUE CROSS/BLUE SHIELD | Attending: Emergency Medicine | Admitting: Emergency Medicine

## 2020-12-30 DIAGNOSIS — H5712 Ocular pain, left eye: Secondary | ICD-10-CM | POA: Diagnosis not present

## 2020-12-30 DIAGNOSIS — Z5321 Procedure and treatment not carried out due to patient leaving prior to being seen by health care provider: Secondary | ICD-10-CM | POA: Diagnosis not present

## 2020-12-30 DIAGNOSIS — R079 Chest pain, unspecified: Secondary | ICD-10-CM | POA: Diagnosis present

## 2020-12-30 LAB — CBC
HCT: 45.4 % (ref 36.0–46.0)
Hemoglobin: 15.1 g/dL — ABNORMAL HIGH (ref 12.0–15.0)
MCH: 32.7 pg (ref 26.0–34.0)
MCHC: 33.3 g/dL (ref 30.0–36.0)
MCV: 98.3 fL (ref 80.0–100.0)
Platelets: 346 10*3/uL (ref 150–400)
RBC: 4.62 MIL/uL (ref 3.87–5.11)
RDW: 11.9 % (ref 11.5–15.5)
WBC: 13.5 10*3/uL — ABNORMAL HIGH (ref 4.0–10.5)
nRBC: 0 % (ref 0.0–0.2)

## 2020-12-30 LAB — RAPID URINE DRUG SCREEN, HOSP PERFORMED
Amphetamines: POSITIVE — AB
Barbiturates: NOT DETECTED
Benzodiazepines: NOT DETECTED
Cocaine: NOT DETECTED
Opiates: NOT DETECTED
Tetrahydrocannabinol: NOT DETECTED

## 2020-12-30 LAB — BASIC METABOLIC PANEL
Anion gap: 11 (ref 5–15)
BUN: 17 mg/dL (ref 6–20)
CO2: 25 mmol/L (ref 22–32)
Calcium: 9.4 mg/dL (ref 8.9–10.3)
Chloride: 101 mmol/L (ref 98–111)
Creatinine, Ser: 0.79 mg/dL (ref 0.44–1.00)
GFR, Estimated: >60 mL/min (ref >60)
Glucose, Bld: 94 mg/dL (ref 70–99)
Potassium: 3.6 mmol/L (ref 3.5–5.1)
Sodium: 137 mmol/L (ref 135–145)

## 2020-12-30 LAB — I-STAT BETA HCG BLOOD, ED (MC, WL, AP ONLY): I-stat hCG, quantitative: <5 m[IU]/mL (ref <5)

## 2020-12-30 LAB — ETHANOL: Alcohol, Ethyl (B): <10 mg/dL (ref <10)

## 2020-12-30 LAB — TROPONIN I (HIGH SENSITIVITY): Troponin I (High Sensitivity): <2 ng/L (ref <18)

## 2020-12-30 NOTE — ED Triage Notes (Signed)
Pt reports that she is having L eye pain that has been going on for a long time and CP that started today.

## 2020-12-30 NOTE — ED Notes (Signed)
Pt returned to lobby.   

## 2020-12-30 NOTE — ED Provider Notes (Signed)
ChestEmergency Medicine Provider Triage Evaluation Note  Deborah Park , a 35 y.o. female  was evaluated in triage.  Pt complains of back pain.  Pain initially started as left-sided sharp back pain which feels like "metal shards "that has been constant since onset.  It is since migrated to the right side.  She does have a history of polysubstance abuse and psychiatric illness. Reports associated nausea but denies any vomiting, shortness of breath, abdominal pain, fever, and chills  Also complains of an entity climbing behind her eyes and states that there is a metal wire behind there.  Review of Systems  Positive:  Negative: See above  Physical Exam  BP 104/86 (BP Location: Right Arm)   Pulse (!) 110   Temp 98.6 F (37 C)   Resp 18   SpO2 100%  Gen:   Awake, no distress   Resp:  Normal effort  MSK:   Moves extremities without difficulty  Other:  Right parathoracic tenderness just medial to the scapula.  Medical Decision Making  Medically screening exam initiated at 9:29 PM.  Appropriate orders placed.  Deborah Park was informed that the remainder of the evaluation will be completed by another provider, this initial triage assessment does not replace that evaluation, and the importance of remaining in the ED until their evaluation is complete.     Teressa Lower, PA-C 12/30/20 2131    Benjiman Core, MD 12/30/20 2226

## 2020-12-31 LAB — TROPONIN I (HIGH SENSITIVITY): Troponin I (High Sensitivity): <2 ng/L (ref <18)

## 2020-12-31 NOTE — ED Notes (Signed)
X1 for vitals with no response 

## 2020-12-31 NOTE — ED Notes (Signed)
X2 for vitals recheck with no response °

## 2020-12-31 NOTE — ED Notes (Signed)
X3 for vitals with no response 

## 2021-01-01 ENCOUNTER — Encounter: Payer: Self-pay | Admitting: Internal Medicine

## 2021-01-01 ENCOUNTER — Other Ambulatory Visit: Payer: Self-pay

## 2021-01-01 ENCOUNTER — Emergency Department
Admission: EM | Admit: 2021-01-01 | Discharge: 2021-01-02 | Disposition: A | Discharge status: Home / Self Care | Payer: BLUE CROSS/BLUE SHIELD | Attending: Emergency Medicine | Admitting: Emergency Medicine

## 2021-01-01 DIAGNOSIS — R45851 Suicidal ideations: Secondary | ICD-10-CM | POA: Insufficient documentation

## 2021-01-01 DIAGNOSIS — Y900 Blood alcohol level of less than 20 mg/100 ml: Secondary | ICD-10-CM | POA: Insufficient documentation

## 2021-01-01 DIAGNOSIS — F17210 Nicotine dependence, cigarettes, uncomplicated: Secondary | ICD-10-CM | POA: Insufficient documentation

## 2021-01-01 DIAGNOSIS — R6884 Jaw pain: Secondary | ICD-10-CM | POA: Diagnosis present

## 2021-01-01 LAB — COMPREHENSIVE METABOLIC PANEL
ALT: 17 U/L (ref 0–44)
AST: 21 U/L (ref 15–41)
Albumin: 4.4 g/dL (ref 3.5–5.0)
Alkaline Phosphatase: 42 U/L (ref 38–126)
Anion gap: 10 (ref 5–15)
BUN: 11 mg/dL (ref 6–20)
CO2: 24 mmol/L (ref 22–32)
Calcium: 9.3 mg/dL (ref 8.9–10.3)
Chloride: 101 mmol/L (ref 98–111)
Creatinine, Ser: 0.64 mg/dL (ref 0.44–1.00)
GFR, Estimated: >60 mL/min (ref >60)
Glucose, Bld: 90 mg/dL (ref 70–99)
Potassium: 3.6 mmol/L (ref 3.5–5.1)
Sodium: 135 mmol/L (ref 135–145)
Total Bilirubin: 2.6 mg/dL — ABNORMAL HIGH (ref 0.3–1.2)
Total Protein: 7.7 g/dL (ref 6.5–8.1)

## 2021-01-01 LAB — URINE DRUG SCREEN, QUALITATIVE (ARMC ONLY)
Amphetamines, Ur Screen: POSITIVE — AB
Barbiturates, Ur Screen: NOT DETECTED
Benzodiazepine, Ur Scrn: NOT DETECTED
Cannabinoid 50 Ng, Ur ~~LOC~~: NOT DETECTED
Cocaine Metabolite,Ur ~~LOC~~: NOT DETECTED
MDMA (Ecstasy)Ur Screen: NOT DETECTED
Methadone Scn, Ur: NOT DETECTED
Opiate, Ur Screen: NOT DETECTED
Phencyclidine (PCP) Ur S: NOT DETECTED
Tricyclic, Ur Screen: NOT DETECTED

## 2021-01-01 LAB — CBC
HCT: 42.7 % (ref 36.0–46.0)
Hemoglobin: 15.6 g/dL — ABNORMAL HIGH (ref 12.0–15.0)
MCH: 34.6 pg — ABNORMAL HIGH (ref 26.0–34.0)
MCHC: 36.5 g/dL — ABNORMAL HIGH (ref 30.0–36.0)
MCV: 94.7 fL (ref 80.0–100.0)
Platelets: 322 10*3/uL (ref 150–400)
RBC: 4.51 MIL/uL (ref 3.87–5.11)
RDW: 11.6 % (ref 11.5–15.5)
WBC: 13.7 10*3/uL — ABNORMAL HIGH (ref 4.0–10.5)
nRBC: 0 % (ref 0.0–0.2)

## 2021-01-01 LAB — ACETAMINOPHEN LEVEL: Acetaminophen (Tylenol), Serum: <10 ug/mL — ABNORMAL LOW (ref 10–30)

## 2021-01-01 LAB — ETHANOL: Alcohol, Ethyl (B): <10 mg/dL (ref <10)

## 2021-01-01 LAB — SALICYLATE LEVEL: Salicylate Lvl: <7 mg/dL — ABNORMAL LOW (ref 7.0–30.0)

## 2021-01-01 LAB — POC URINE PREG, ED: Preg Test, Ur: NEGATIVE

## 2021-01-01 NOTE — ED Triage Notes (Addendum)
Patient here today reporting jaw pain that started yesterday. Rates pain 6/10 throbbing pain, also reports (L) eye is throbbing. Endorses generalized tooth pain. Has not seen a dentist recently. Endorses bilateral tinnitus.Denies trismus.Denies injury to jaw.

## 2021-01-01 NOTE — ED Notes (Signed)
Patient states she is here for "true" medical pain and because I answered some questions honestly I'm here now.  Patient reassured that medical complaints would be addressed as well as mental health.  Patient states bilateral jaw pain that started last pm causing ringing in her ears.  Patient states that left side "popped" and pain has eased with no ringing in that left ear.  Continues to report ringing in right ear.  Patient reports having thoughts of wanting to harm herself, but with no specific plan.  Patient denies attempting to harm herself in the past.

## 2021-01-01 NOTE — ED Notes (Signed)
Kizzie Furnish (son's father) [phone]

## 2021-01-01 NOTE — ED Provider Notes (Signed)
[facility] Emergency Department Provider Note  Time seen: 11:34 PM  I have reviewed the triage vital signs and the nursing notes.   HISTORY  Chief Complaint Suicidal and Jaw Pain   HPI Deborah Park is a 35 y.o. female with a past medical history of anxiety, substance abuse, presents to the emergency department for jaw pain.  According to the patient over the past several days she has been experiencing pain in both sides of her jaw.  Denies any trauma.  States she felt her jaw pop several days ago and has since been experiencing some pain.  Patient does admit to cocaine and methamphetamine use as well.  Patient has a largely negative review of systems otherwise.  During initial triage however when asked if she is having any thoughts of hurting herself the patient responds yes she has had thoughts of hurting herself but she has no active plan to do so and has never tried doing so in the past.   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 Authorizing Provider  Blood Pressure Monitoring (BLOOD PRESSURE KIT) DEVI 1 kit by Does not apply route once a week. Check Blood Pressure regularly and record readings into the [employer] App.  Large Cuff.  DX O90.0 06/05/19   Woodroe Mode, MD  erythromycin ophthalmic ointment Place a 1/2 inch ribbon of ointment into the lower eyelid 3 times a day 11/08/20   Hayden Rasmussen, MD  ketorolac (ACULAR) 0.5 % ophthalmic solution Place 1 drop into both eyes 4 (four) times daily. 12/09/20   Menshew, Dannielle Karvonen, PA-C  ondansetron (ZOFRAN ODT) 4 MG disintegrating tablet Take 1 tablet (4 mg total) by mouth every 8 (eight) hours as needed for nausea or vomiting. 11/08/20   Khatri, Hina, PA-C  Prenat-FeAsp-Meth-FA-DHA w/o A (PRENATE PIXIE) 10-0.6-0.4-200 MG CAPS Take 1 tablet by mouth daily. 05/08/19   Woodroe Mode, MD  sodium chloride (OCEAN) 0.65 % SOLN nasal spray Place 1 spray into both nostrils as needed for congestion. 08/11/20   Carmin Muskrat, MD    No Known Allergies  Family History  Problem Relation Age of Onset   Healthy Mother    Healthy Father    Bipolar disorder Maternal Grandmother    Seizures Neg Hx    Parkinsonism Neg Hx    Multiple sclerosis Neg Hx    Dementia Neg Hx     Social History Social History   Tobacco Use   Smoking status:  Every Day    Packs/day: 1.00    Years: 23.00    Pack years: 23.00    Types: Cigarettes   Smokeless tobacco: Never   Tobacco comments:    10 cigs/day  Vaping Use   Vaping Use: Never used  Substance Use Topics   Alcohol use: Not Currently   Drug use: Yes    Frequency: 1.0 times per week    Types: Amphetamines, Cocaine, Methamphetamines    Comment: last report 08 Feb 2018. pt reports none in preg    Review of Systems Constitutional: Negative for fever. Cardiovascular: Negative for chest pain. Respiratory: Negative for shortness of breath. Gastrointestinal: Negative for  abdominal pain, vomiting  Musculoskeletal: Negative for musculoskeletal complaints Neurological: Negative for headache All other ROS negative  ____________________________________________   PHYSICAL EXAM:  VITAL SIGNS: ED Triage Vitals  Enc Vitals Group     BP 01/01/21 2132 109/86     Pulse Rate 01/01/21 2132 (!) 106     Resp --      Temp 01/01/21 2132 98.8 F (37.1 C)     Temp Source 01/01/21 2132 Oral     SpO2 01/01/21 2132 97 %     Weight 01/01/21 2139 109 lb (49.4 kg)     Height 01/01/21 2139 _0  (1.575 m)     Head Circumference --      Peak Flow --      Pain Score 01/01/21 2139 6     Pain Loc --      Pain Edu? --      Excl. in [place]? --    Constitutional: Alert and oriented. Well appearing and in no distress. Eyes: Normal exam ENT      Head: Normocephalic and atraumatic.      Nose: No congestion/rhinnorhea.      Mouth/Throat: Mucous membranes are moist.  Great range of motion in the jaw with no pain elicited.  No concern for fracture or dislocation. Cardiovascular: Normal rate, regular rhythm.  Respiratory: Normal respiratory effort without tachypn Musculoskeletal: Nontender with normal range of motion in all extremities.  Neurologic:  Normal speech and language. No gross focal neurologic deficits Skin:  Skin is warm, dry and intact.  Psychiatric: Mood and affect are normal.    INITIAL IMPRESSION / ASSESSMENT AND PLAN / ED COURSE  Pertinent labs & imaging results that were available during my care of the patient were reviewed by me and considered in my medical decision making (see chart for details).   Patient presents to the emergency department with complaints of jaw pain as well as intermittent suicidal thoughts although denies any presently denies any plan to do so and denies any history of attempting in the past.  Patient does admit to recent cocaine as well as methamphetamine use.  Patient states she typically uses cocaine but recently has been using  methamphetamine as well.  Patient's jaw pain very well could be due to methamphetamine use and jaw clenching.  There is no sign of any trauma.  Great range of motion the jaw, no concern for dislocation or fracture on my examination.  Patient's labs are generally nonrevealing methamphetamine positive urine toxicology.  Given the patient's intermittent suicidal ideation we will have psychiatry evaluate as well although I do not believe she meets IVC criteria currently given no active plan or current thoughts of hurting herself.  Psychiatry is seen and believe the patient is safe for discharge from a psychiatric standpoint.  As far as the patient's jaw and  ear pain I did offer CT imaging patient does not believe it is broken which I agree with.  On otoscope exam patient has normal auditory canals as well as tympanic membranes.  Patient will be discharged home with PCP follow-up.  I did discuss with the patient avoiding substance use.  Deborah Park was evaluated in Emergency Department on 01/01/2021 for the symptoms described in the history of present illness. She was evaluated in the context of the global COVID-19 pandemic, which necessitated consideration that the patient might be at risk for infection with the SARS-CoV-2 virus that causes COVID-19. Institutional protocols and algorithms that pertain to the evaluation of patients at risk for COVID-19 are in a state of rapid change based on information released by regulatory bodies including the CDC and federal and state organizations. These policies and algorithms were followed during the patient's care in the ED.  ____________________________________________   FINAL CLINICAL IMPRESSION(S) / ED DIAGNOSES  Jaw pain    Harvest Dark, MD 01/02/21 0010

## 2021-01-02 DIAGNOSIS — R6884 Jaw pain: Secondary | ICD-10-CM | POA: Diagnosis not present

## 2021-01-02 NOTE — BH Assessment (Signed)
Comprehensive Clinical Assessment (CCA) Note  01/02/2021 Deborah Park [id number] Recommendations for Services/Supports/Treatments: Consulted with Rashaun D, NP, who determined pt. does not meet inpatient criteria and is cleared for discharge. Notified Dr. Derrill Kay and Alvis Lemmings, RN of disposition recommendation. Pt was provided with outpatient substance abuse resources.  Deborah Park is a 35 year old, English speaking, white female with a hx of MDD, anxiety, psychoactive induced psychosis, and polysubstance abuse. Pt presented to [facility] voluntarily for jaw pain. During triage pt reported that she has thought of suicide but denies currently suicidality. Pt was adamant that she does not have any suicidal intent and has no previous attempts in the past. Of note, pt has had multiple encounters with the ED this week for physical pain. Per patient report, her main stressor is the pain in her jaw and needing to see a doctor. Pt denied symptoms of depression. Pt had shallow insight and her judgement was fair. Pt had clear and coherent speech, and her thoughts were cogent and intact. Pt was cooperative, throughout the assessment. Pt presented with an irritable mood and a congruent affect. Pt had restless psychomotor activity; eye contact was fleeting. The patient denied SI, HI or AV/H. Pt did not appear to be responding to internal stimuli. Pt had a UDS positive for amphetamine. Pt admitted to ongoing cocaine use and reported that she'd used amphetamine approximately 3 weeks ago.  Chief Complaint:  Chief Complaint  Patient presents with   Suicidal   Jaw Pain   Visit Diagnosis: Diagnosis deferred    CCA Screening, Triage and Referral (STR)  Patient Reported Information How did you hear about [place]? Self  Referral name: No data recorded Referral phone number: No data recorded  Whom do you see for routine medical problems? No data recorded Practice/Facility Name: No data recorded Practice/Facility Phone  Number: No data recorded Name of Contact: No data recorded Contact Number: No data recorded Contact Fax Number: No data recorded Prescriber Name: No data recorded Prescriber Address (if known): No data recorded  What Is the Reason for Your Visit/Call Today? Jaw Pain  How Long Has This Been Causing You Problems? <Week  What Do You Feel Would Help You the Most Today? -- (Treatment for physical pain)   Have You Recently Been in Any Inpatient Treatment (Hospital/Detox/Crisis Center/28-Day Program)? No data recorded Name/Location of Program/Hospital:No data recorded How Long Were You There? No data recorded When Were You Discharged? No data recorded  Have You Ever Received Services From [facility] Before? No data recorded Who Do You See at [facility]? No data recorded  Have You Recently Had Any Thoughts About Hurting Yourself? No  Are You Planning to Commit Suicide/Harm Yourself At This time? No   Have you Recently Had Thoughts About Hurting Someone [name]? No  Explanation: No data recorded  Have You Used Any Alcohol or Drugs in the Past 24 Hours? No data recorded How Long Ago Did You Use Drugs or Alcohol? No data recorded What Did You Use and How Much? No data recorded  Do You Currently Have a Therapist/Psychiatrist? No  Name of Therapist/Psychiatrist: No data recorded  Have You Been Recently Discharged From Any Office Practice or Programs? No  Explanation of Discharge From Practice/Program: No data recorded    CCA Screening Triage Referral Assessment Type of Contact: Face-to-Face  Is this Initial or Reassessment? No data recorded Date Telepsych consult ordered in [facility]:  No data recorded Time Telepsych consult ordered in [facility]:  No data recorded  Patient Reported  Information Reviewed? No data recorded Patient Left Without Being Seen? No data recorded Reason for Not Completing Assessment: No data recorded  Collateral Involvement: None provided   Does Patient Have a  Court Appointed Legal Guardian? No data recorded Name and Contact of Legal Guardian: No data recorded If Minor and Not Living with Parent(s), Who has Custody? No data recorded Is CPS involved or ever been involved? In the Past  Is APS involved or ever been involved? Never   Patient Determined To Be At Risk for Harm To Self or Others Based on Review of Patient Reported Information or Presenting Complaint? No  Method: No data recorded Availability of Means: No data recorded Intent: No data recorded Notification Required: No data recorded Additional Information for Danger to Others Potential: No data recorded Additional Comments for Danger to Others Potential: No data recorded Are There Guns or Other Weapons in Your Home? No data recorded Types of Guns/Weapons: No data recorded Are These Weapons Safely Secured?                            No data recorded Who Could Verify You Are Able To Have These Secured: No data recorded Do You Have any Outstanding Charges, Pending Court Dates, Parole/Probation? No data recorded Contacted To Inform of Risk of Harm To Self or Others: No data recorded  Location of Assessment: [facility] ED   Does Patient Present under Involuntary Commitment? No  IVC Papers Initial File Date: No data recorded  [place] of Residence: [place]   Patient Currently Receiving the Following Services: Not Receiving Services   Determination of Need: Emergent (2 hours)   Options For Referral: Therapeutic Triage Services     CCA Biopsychosocial Intake/Chief Complaint:  No data recorded Current Symptoms/Problems: No data recorded  Patient Reported Schizophrenia/Schizoaffective Diagnosis in Past: No   Strengths: Pt is able to articulate needs  Preferences: No data recorded Abilities: No data recorded  Type of Services Patient Feels are Needed: No data recorded  Initial Clinical Notes/Concerns: No data recorded  Mental Health Symptoms Depression:   None    Duration of Depressive symptoms: No data recorded  Mania:   None   Anxiety:    None   Psychosis:   None   Duration of Psychotic symptoms: No data recorded  Trauma:   N/A   Obsessions:   None   Compulsions:   None   Inattention:   None   Hyperactivity/Impulsivity:   None   Oppositional/Defiant Behaviors:   None   Emotional Irregularity:   None   Other Mood/Personality Symptoms:  No data recorded   Mental Status Exam Appearance and self-care  Stature:   Small   Weight:   Thin   Clothing:   -- (In scrubs)   Grooming:   Normal   Cosmetic use:   None   Posture/gait:   Normal   Motor activity:   Restless   Sensorium  Attention:   Distractible   Concentration:   Anxiety interferes   Orientation:   X5   Recall/memory:   Defective in Short-term   Affect and Mood  Affect:   Full Range   Mood:   Irritable   Relating  Eye contact:   Fleeting   Facial expression:   Responsive   Attitude toward examiner:   Cooperative   Thought and Language  Speech flow:  Clear and Coherent   Thought content:   Appropriate to Mood and Circumstances  Preoccupation:   None   Hallucinations:   None   Organization:  No data recorded  [employer] of Knowledge:   Average   Intelligence:   Average   Abstraction:   Normal   Judgement:   Good   Reality Testing:   Adequate   Insight:   Present   Decision Making:   Vacilates   Social Functioning  Social Maturity:   Irresponsible   Social Judgement:   Normal   Stress  Stressors:   Illness   Coping Ability:   Human resources officer Deficits:   Self-care   Supports:   Support needed     Religion: Religion/Spirituality Are You A Religious Person?: No  Leisure/Recreation: Leisure / Recreation Do You Have Hobbies?: No  Exercise/Diet: Exercise/Diet Do You Exercise?: No Have You Gained or Lost A Significant Amount of Weight in the Past Six Months?:  No Do You Follow a Special Diet?: No Do You Have Any Trouble Sleeping?: Yes Explanation of Sleeping Difficulties: Pt has a hx of sleep disturbance   CCA Employment/Education Employment/Work Situation: Employment / Work Situation Employment Situation: Employed Work Stressors: None reported Patient's Job has Been Impacted by Current Illness: No Has Patient ever Been in Equities trader?: No  Education: Education Is Patient Currently Attending School?: No   CCA Family/Childhood History Family and Relationship History: Family history Marital status:  (Domestic partner) Does patient have children?: Yes  Childhood History:  Childhood History By whom was/is the patient raised?: Mother Did patient suffer any verbal/emotional/physical/sexual abuse as a child?: No Did patient suffer from severe childhood neglect?: No Has patient ever been sexually abused/assaulted/raped as an adolescent or adult?: No Was the patient ever a victim of a crime or a disaster?: No Witnessed domestic violence?: No Has patient been affected by domestic violence as an adult?: No  Child/Adolescent Assessment:     CCA Substance Use Alcohol/Drug Use: Alcohol / Drug Use Pain Medications: See MAR Prescriptions: See MAR Over the Counter: See MAR History of alcohol / drug use?: Yes Longest period of sobriety (when/how long): NA Negative Consequences of Use: Personal relationships Withdrawal Symptoms: None                         ASAM's:  Six Dimensions of Multidimensional Assessment  Dimension 1:  Acute Intoxication and/or Withdrawal Potential:   Dimension 1:  Description of individual's past and current experiences of substance use and withdrawal: Pt has a hx of psychoactive-induced psychosis  Dimension 2:  Biomedical Conditions and Complications:      Dimension 3:  Emotional, Behavioral, or Cognitive Conditions and Complications:  Dimension 3:  Description of emotional, behavioral, or  cognitive conditions and complications: Pt has a hx of MDD, anxiety  Dimension 4:  Readiness to Change:     Dimension 5:  Relapse, Continued use, or Continued Problem Potential:     Dimension 6:  Recovery/Living Environment:     ASAM Severity Score: ASAM's Severity Rating Score: 13  ASAM Recommended Level of Treatment: ASAM Recommended Level of Treatment: Level I Outpatient Treatment   Substance use Disorder (SUD) Substance Use Disorder (SUD)  Checklist Symptoms of Substance Use: Continued use despite having a persistent/recurrent physical/psychological problem caused/exacerbated by use  Recommendations for Services/Supports/Treatments: Recommendations for Services/Supports/Treatments Recommendations For Services/Supports/Treatments: Individual Therapy  DSM5 Diagnoses: Patient Active Problem List   Diagnosis Date Noted   Genetic carrier 06/05/2019   Screening for genetic disease carrier status 05/28/2019  AMA (advanced maternal age) multigravida 35+ 05/10/2019   Chiari malformation type II [facility])    Supervision of high risk pregnancy, antepartum 05/08/2019   History of preterm delivery, currently pregnant 05/08/2019   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   MDD (major depressive disorder), recurrent severe, without psychosis ([facility]CC) 11/04/2018   Psychoactive substance-induced psychosis ([facility])    MDD (major depressive disorder), single episode, severe with psychosis ([facility]CC) 08/29/2018   Polysubstance abuse ([facility]CC) 02/14/2018   Memory loss 09/17/2013   Sleep disturbance 09/17/2013    P  Myquan Schaumburg R Dyrell Tuccillo, LCAS

## 2021-01-02 NOTE — Discharge Instructions (Addendum)
As we discussed please avoid substance use in the future.  Please avoid jaw clenching.  If you continue to have jaw pain over the next several weeks please call the number provided for ENT to arrange a follow-up appointment.  Return to the emergency department for any symptoms personally concerning to yourself.

## 2021-01-04 ENCOUNTER — Other Ambulatory Visit: Payer: Self-pay

## 2021-01-04 ENCOUNTER — Encounter: Payer: Self-pay | Admitting: Radiology

## 2021-01-04 ENCOUNTER — Emergency Department: Payer: BLUE CROSS/BLUE SHIELD

## 2021-01-04 ENCOUNTER — Emergency Department
Admission: EM | Admit: 2021-01-04 | Discharge: 2021-01-04 | Disposition: A | Discharge status: Home / Self Care | Payer: BLUE CROSS/BLUE SHIELD | Attending: Emergency Medicine | Admitting: Emergency Medicine

## 2021-01-04 DIAGNOSIS — R0602 Shortness of breath: Secondary | ICD-10-CM | POA: Diagnosis not present

## 2021-01-04 DIAGNOSIS — E876 Hypokalemia: Secondary | ICD-10-CM

## 2021-01-04 DIAGNOSIS — H53149 Visual discomfort, unspecified: Secondary | ICD-10-CM | POA: Diagnosis not present

## 2021-01-04 DIAGNOSIS — R42 Dizziness and giddiness: Secondary | ICD-10-CM | POA: Insufficient documentation

## 2021-01-04 DIAGNOSIS — F17210 Nicotine dependence, cigarettes, uncomplicated: Secondary | ICD-10-CM | POA: Diagnosis not present

## 2021-01-04 DIAGNOSIS — R200 Anesthesia of skin: Secondary | ICD-10-CM | POA: Insufficient documentation

## 2021-01-04 DIAGNOSIS — R079 Chest pain, unspecified: Secondary | ICD-10-CM | POA: Diagnosis present

## 2021-01-04 LAB — TROPONIN I (HIGH SENSITIVITY)
Troponin I (High Sensitivity): <2 ng/L (ref <18)
Troponin I (High Sensitivity): <2 ng/L (ref <18)

## 2021-01-04 LAB — COMPREHENSIVE METABOLIC PANEL
ALT: 18 U/L (ref 0–44)
AST: 20 U/L (ref 15–41)
Albumin: 4.4 g/dL (ref 3.5–5.0)
Alkaline Phosphatase: 47 U/L (ref 38–126)
Anion gap: 12 (ref 5–15)
BUN: 13 mg/dL (ref 6–20)
CO2: 22 mmol/L (ref 22–32)
Calcium: 9.3 mg/dL (ref 8.9–10.3)
Chloride: 104 mmol/L (ref 98–111)
Creatinine, Ser: 0.72 mg/dL (ref 0.44–1.00)
GFR, Estimated: >60 mL/min (ref >60)
Glucose, Bld: 101 mg/dL — ABNORMAL HIGH (ref 70–99)
Potassium: 3.2 mmol/L — ABNORMAL LOW (ref 3.5–5.1)
Sodium: 138 mmol/L (ref 135–145)
Total Bilirubin: 2.2 mg/dL — ABNORMAL HIGH (ref 0.3–1.2)
Total Protein: 7.5 g/dL (ref 6.5–8.1)

## 2021-01-04 LAB — LIPASE, BLOOD: Lipase: 22 U/L (ref 11–51)

## 2021-01-04 LAB — CBC
HCT: 43.7 % (ref 36.0–46.0)
Hemoglobin: 16 g/dL — ABNORMAL HIGH (ref 12.0–15.0)
MCH: 34.6 pg — ABNORMAL HIGH (ref 26.0–34.0)
MCHC: 36.6 g/dL — ABNORMAL HIGH (ref 30.0–36.0)
MCV: 94.4 fL (ref 80.0–100.0)
Platelets: 327 10*3/uL (ref 150–400)
RBC: 4.63 MIL/uL (ref 3.87–5.11)
RDW: 11.6 % (ref 11.5–15.5)
WBC: 8.5 10*3/uL (ref 4.0–10.5)
nRBC: 0 % (ref 0.0–0.2)

## 2021-01-04 LAB — HCG, QUANTITATIVE, PREGNANCY: hCG, Beta Chain, Quant, S: <1 m[IU]/mL (ref <5)

## 2021-01-04 MED ORDER — POTASSIUM CHLORIDE CRYS ER 20 MEQ PO TBCR
40.0000 meq | EXTENDED_RELEASE_TABLET | Freq: Once | ORAL | Status: AC
  Administered 2021-01-04: 40 meq via ORAL
  Filled 2021-01-04: qty 2

## 2021-01-04 MED ORDER — LORAZEPAM 2 MG/ML IJ SOLN
1.0000 mg | Freq: Once | INTRAMUSCULAR | Status: AC
  Administered 2021-01-04: 1 mg via INTRAVENOUS
  Filled 2021-01-04: qty 1

## 2021-01-04 MED ORDER — SODIUM CHLORIDE 0.9 % IV BOLUS
1000.0000 mL | Freq: Once | INTRAVENOUS | Status: AC
  Administered 2021-01-04: 1000 mL via INTRAVENOUS

## 2021-01-04 MED ORDER — IOHEXOL 350 MG/ML SOLN
75.0000 mL | Freq: Once | INTRAVENOUS | Status: AC | PRN
  Administered 2021-01-04: 75 mL via INTRAVENOUS

## 2021-01-04 NOTE — Discharge Instructions (Signed)
Avoid illicit substances.  Return to the ER for worsening symptoms, persistent vomiting, difficulty breathing or other concerns.

## 2021-01-04 NOTE — ED Notes (Signed)
Pt c/o rectal bleeding after going to the bathroom.  Dr. Dolores Frame made aware at this time.  Will continue to monitor.

## 2021-01-04 NOTE — ED Provider Notes (Signed)
[facility] Emergency Department Provider Note   ____________________________________________   Event Date/Time   First MD Initiated Contact with Patient 01/04/21 0354     (approximate)  I have reviewed the triage vital signs and the nursing notes.   HISTORY  Chief Complaint Chest Pain    HPI Deborah Park is a 35 y.o. female who presents to the ED from home with a chief complaint of substernal chest pain, dizziness, bilateral arm numbness and blurry vision.  Reports symptoms tonight.  Endorses cocaine use.  Symptoms associated with shortness of breath.  Denies fever, cough, headache, nausea, vomiting.      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 Authorizing Provider  Blood Pressure Monitoring (BLOOD PRESSURE KIT) DEVI 1 kit by Does not apply route once a week. Check Blood Pressure regularly and record readings into the [employer] App.  Large Cuff.  DX O90.0  06/05/19   Woodroe Mode, MD  erythromycin ophthalmic ointment Place a 1/2 inch ribbon of ointment into the lower eyelid 3 times a day 11/08/20   Hayden Rasmussen, MD  ketorolac (ACULAR) 0.5 % ophthalmic solution Place 1 drop into both eyes 4 (four) times daily. 12/09/20   Menshew, Dannielle Karvonen, PA-C  ondansetron (ZOFRAN ODT) 4 MG disintegrating tablet Take 1 tablet (4 mg total) by mouth every 8 (eight) hours as needed for nausea or vomiting. 11/08/20   Khatri, Hina, PA-C  Prenat-FeAsp-Meth-FA-DHA w/o A (PRENATE PIXIE) 10-0.6-0.4-200 MG CAPS Take 1 tablet by mouth daily. 05/08/19   Woodroe Mode, MD  sodium chloride (OCEAN) 0.65 % SOLN nasal spray Place 1 spray into both nostrils as needed for congestion. 08/11/20   Carmin Muskrat, MD    Allergies Patient has no known allergies.  Family History  Problem Relation Age of Onset   Healthy Mother    Healthy Father    Bipolar disorder Maternal Grandmother    Seizures Neg Hx    Parkinsonism Neg Hx    Multiple sclerosis Neg Hx    Dementia Neg Hx     Social History Social History   Tobacco Use   Smoking status: Every Day    Packs/day: 1.00    Years: 23.00    Pack years: 23.00    Types: Cigarettes   Smokeless tobacco: Never   Tobacco comments:    10 cigs/day  Vaping Use   Vaping Use: Never used  Substance Use Topics   Alcohol use: Not Currently  Drug use: Yes    Frequency: 1.0 times per week    Types: Amphetamines, Cocaine, Methamphetamines    Comment: last report 08 Feb 2018. pt reports none in preg    Review of Systems  Constitutional: No fever/chills Eyes: No visual changes. ENT: No sore throat. Cardiovascular: Positive for chest pain. Respiratory: Denies shortness of breath. Gastrointestinal: No abdominal pain.  No nausea, no vomiting.  No diarrhea.  No constipation. Genitourinary: Negative for dysuria. Musculoskeletal: Negative for back pain. Skin: Negative for rash. Neurological: Negative for headaches, focal  weakness.  Positive for numbness. Psychiatric: Positive for anxiety.  ____________________________________________   PHYSICAL EXAM:  VITAL SIGNS: ED Triage Vitals  Enc Vitals Group     BP 01/04/21 0245 (!) 144/127     Pulse Rate 01/04/21 0245 97     Resp 01/04/21 0245 (!) 26     Temp 01/04/21 0245 97.8 F (36.6 C)     Temp Source 01/04/21 0245 Oral     SpO2 01/04/21 0245 100 %     Weight 01/04/21 0246 109 lb (49.4 kg)     Height 01/04/21 0246 '5\' 2"'  (1.575 m)     Head Circumference --      Peak Flow --      Pain Score 01/04/21 0245 8     Pain Loc --      Pain Edu? --      Excl. in [place]? --     Constitutional: Alert and oriented.  Cachectic appearing and in mild acute distress. Eyes: Wide-eyed.  Conjunctivae are normal. PERRL. EOMI. Head: Atraumatic. Nose: No congestion/rhinnorhea. Mouth/Throat: Mucous membranes are moist.   Neck: No stridor.  No carotid bruits.  Supple neck without meningismus. Cardiovascular: Normal rate, regular rhythm. Grossly normal heart sounds.  Good peripheral circulation; 2+ radial pulses. Respiratory: Normal respiratory effort.  No retractions. Lungs CTAB. Gastrointestinal: Soft and nontender to light or deep palpation. No distention. No abdominal bruits. No CVA tenderness. Musculoskeletal: No lower extremity tenderness nor edema.  No joint effusions. Neurologic:  Normal speech and language. No gross focal neurologic deficits are appreciated.  5/5 motor strength and sensation all extremities.  No gait instability. Skin:  Skin is warm, dry and intact. No rash noted. Psychiatric: Mood and affect are anxious. Speech and behavior are normal.  ____________________________________________   LABS (all labs ordered are listed, but only abnormal results are displayed)  Labs Reviewed  CBC - Abnormal; Notable for the following components:      Result Value   Hemoglobin 16.0 (*)    MCH 34.6 (*)    MCHC 36.6 (*)    All other components within normal  limits  COMPREHENSIVE METABOLIC PANEL - Abnormal; Notable for the following components:   Potassium 3.2 (*)    Glucose, Bld 101 (*)    Total Bilirubin 2.2 (*)    All other components within normal limits  LIPASE, BLOOD  HCG, QUANTITATIVE, PREGNANCY  TROPONIN I (HIGH SENSITIVITY)  TROPONIN I (HIGH SENSITIVITY)   ____________________________________________  EKG  ED ECG REPORT I, Laury Huizar J, the attending physician, personally viewed and interpreted this ECG.   Date: 01/04/2021  EKG Time: 0243  Rate: 89  Rhythm: normal sinus rhythm  Axis: RAD  Intervals:none  ST&T Change: Nonspecific  ____________________________________________  RADIOLOGY Cecilie Kicks J, personally viewed and evaluated these images (plain radiographs) as part of my medical decision making, as well as reviewing the written report by the radiologist.  ED MD interpretation: Pending  Official radiology report(s): No  results found.  ____________________________________________   PROCEDURES  Procedure(s) performed (including Critical Care):  Procedures   ____________________________________________   INITIAL IMPRESSION / ASSESSMENT AND PLAN / ED COURSE  As part of my medical decision making, I reviewed the following data within the [place] notes reviewed and incorporated, Labs reviewed, EKG interpreted, Old chart reviewed, Radiograph reviewed, and Notes from prior ED visits     51 year old substance abuser presenting with substernal chest pain, dizziness, blurry vision, bilateral arm numbness.Differential diagnosis includes, but is not limited to, ACS, aortic dissection, pulmonary embolism, cardiac tamponade, pneumothorax, pneumonia, pericarditis, myocarditis, GI-related causes including esophagitis/gastritis, and musculoskeletal chest wall pain.     Laboratory results demonstrate mild hypokalemia, stable T bili, negative troponin.  Will check lipase.  Given patient's  substance abuse history coupled with her symptoms and hypertension, will obtain CT head to rule out ICH, CT angio chest/abdomen/pelvis to rule out dissection.  Will replete potassium orally, administer IV Ativan.  Will reassess.  Clinical Course as of 01/04/21 0653  Tue Jan 04, 2021  1505 Repeat troponin unremarkable.  Awaiting results of CT scans. [JS]  [id number] Patient resting in no acute distress.  Care transferred to oncoming provider pending results of CT imaging studies.  Anticipate patient may be discharged home if those are unremarkable. [JS]    Clinical Course User Index [JS] Paulette Blanch, MD     ____________________________________________   FINAL CLINICAL IMPRESSION(S) / ED DIAGNOSES  Final diagnoses:  Nonspecific chest pain  Hypokalemia     ED Discharge Orders     None        Note:  This document was prepared using Dragon voice recognition software and may include unintentional dictation errors.    Paulette Blanch, MD 01/04/21 [phone]

## 2021-01-04 NOTE — ED Provider Notes (Signed)
-----------------------------------------   7:02 AM on 01/04/2021 -----------------------------------------  Blood pressure (!) 144/127, pulse 97, temperature 97.8 F (36.6 C), temperature source Oral, resp. rate (!) 26, height 5\' 2"  (1.575 m), weight 49.4 kg, last menstrual period 11/20/2020, SpO2 100 %, unknown if currently breastfeeding.  Assuming care from Dr. 11/22/2020.  In short, Deborah Park is a 35 y.o. female with a chief complaint of Chest Pain .  Refer to the original H&P for additional details.  The current plan of care is to follow-up CT head and CTA chest results.  ----------------------------------------- 7:27 AM on 01/04/2021 ----------------------------------------- CT head is negative for acute process, CTA of chest/abdomen/pelvis is negative for acute process as well, no evidence of aortic pathology.  Patient is appropriate for discharge home with PCP follow-up, states she is feeling better following IV fluids and Ativan.  She was counseled to return to the ED for new worsening symptoms, patient agrees with plan.    03/06/2021, MD 01/04/21 [phone]

## 2021-01-04 NOTE — ED Triage Notes (Signed)
Pt in with co dizziness that started yesterday, states tonight started feeling like "something dropped" and is pointing to mid sternal area. Pt also co vision changes and bilat arm numbness. Has had same symptoms in the past and has been seen for the same without any results. Pt very anxious in triage, grabbing epigastric area.

## 2021-01-08 ENCOUNTER — Ambulatory Visit (HOSPITAL_COMMUNITY)
Admission: RE | Admit: 2021-01-08 | Discharge: 2021-01-08 | Disposition: A | Discharge status: Home / Self Care | Payer: BLUE CROSS/BLUE SHIELD | Attending: Emergency Medicine | Admitting: Emergency Medicine

## 2021-01-08 DIAGNOSIS — R45851 Suicidal ideations: Secondary | ICD-10-CM | POA: Insufficient documentation

## 2021-01-08 DIAGNOSIS — F1990 Other psychoactive substance use, unspecified, uncomplicated: Secondary | ICD-10-CM | POA: Insufficient documentation

## 2021-01-08 DIAGNOSIS — F4323 Adjustment disorder with mixed anxiety and depressed mood: Secondary | ICD-10-CM | POA: Diagnosis not present

## 2021-01-08 NOTE — H&P (Signed)
Behavioral Health Medical Screening Exam  Deborah Park is an 35 y.o. female who presented to [facility] alone for assessment of depression. On assessment demographics patient reported experiencing chest pain "in center of chest, sternum, right rib"; on physical exam patient states she has chronic abdominal, chest pain, eye and jaw pain as well has "major health issues the doctors are able to figure out. I've lost substantial amount of weight since April". No obvious distress noted.   Upon chart review, patient has had multiple encounters [facility]) within past week for chest and jaw pain, over the past few months for various eye and medical complaints. 01/01/21, 12/30/20 encounters patient tested positive for amphetamines.   On assessment patient seems hyper-vigilant, alert and oriented; guarded. She reports having increased depression where she has "not been able to get out of bed for the past few weeks". Patient noted ED encounters (09/08, 09/09, 09/19, 09/26, 09/29, 10/01, 10/04). She endorses various ailments and pain. Denies any recent substance use or history of abuse; when asked about methamphetamine patient initially denied then stated she "only tried it for the first time in the past month". Per chart review, patient has extensive history of methamphetamine and polysubstance use. Patient denies any psychiatric history or hospitalizations. States provider needs to "check the records to see if you have the right [place] because I've never been diagnosed with anything and I just tried meth for the first time last month. I've never tested positive before" when asked about past psychiatric history. Patient states she was being seen at [facility], the LME. I was seeing a psychiatrist there. He was prescribing me Hydroxyzine to take as needed but that stuff doesn't work".  States she "hasn't seen him in months". Mentioned hospital doctor giving her "Ativan for my anxiety when I was in the emergency room  recently". Patient inconsistent and vague on symptoms and help she is seeking. Provider discussed [facility] services and open access hours.   Patient endorsed passive suicidal ideations; denies intent or plan. Denies any homicidal ideations, auditory or visual hallucinations, and does not appear to be actively psychotic or responding to any external/internal stimuli at this time. Patient discharged with [facility] resources.   Total Time spent with patient: 20 minutes  Psychiatric Specialty Exam: Physical Exam Vitals and nursing note reviewed.  Constitutional:      Appearance: Normal appearance. She is normal weight.  HENT:     Head: Normocephalic.     Nose: Nose normal.     Mouth/Throat:     Mouth: Mucous membranes are moist.     Pharynx: Oropharynx is clear.  Eyes:     Pupils: Pupils are equal, round, and reactive to light.  Cardiovascular:     Rate and Rhythm: Tachycardia present.  Pulmonary:     Effort: Pulmonary effort is normal.  Musculoskeletal:        General: Normal range of motion.     Cervical back: Normal range of motion.  Skin:    General: Skin is warm and dry.  Neurological:     Mental Status: She is alert.  Psychiatric:        Mood and Affect: Affect is blunt and flat.        Speech: Speech normal.        Behavior: Behavior is uncooperative.        Thought Content: Thought content is not paranoid or delusional. Thought content does not include homicidal ideation. Thought content does not include homicidal plan.  Cognition and Memory: Cognition and memory normal.     Comments: Inconsistent historian   Review of Systems  Constitutional:  Positive for appetite change and unexpected weight change.  Psychiatric/Behavioral:  Positive for dysphoric mood and suicidal ideas.        Polysubstance abuse history  Blood pressure 100/71, pulse 76, temperature 98 F (36.7 C), temperature source Oral, resp. rate 20, SpO2 100 %, unknown if currently breastfeeding.There is no  height or weight on file to calculate BMI. General Appearance: Casual and Disheveled Eye Contact:   inconsistent Speech:  Normal Rate Volume:  Normal Mood:  Dysphoric Affect:  Non-Congruent Thought Process:  Goal Directed Orientation:  Full (Time, Place, and Person) Thought Content:  Illogical Suicidal Thoughts:  Yes.  without intent/plan Homicidal Thoughts:  No Memory:  Immediate;   inconsistent historian Judgement:  Poor Insight:  Lacking and Shallow Psychomotor Activity:  Normal Concentration: Concentration: Fair and Attention Span: Fair Recall:  [employer]:Fair Language: Fair Akathisia:  NA Handed:   AIMS (if indicated):    Assets:  Health and safety inspector Housing Physical Health Resilience Social Support Sleep:     Musculoskeletal: Strength & Muscle Tone: within normal limits Gait & Station: normal Patient leans: N/A  Blood pressure 100/71, pulse 76, temperature 98 F (36.7 C), temperature source Oral, resp. rate 20, SpO2 100 %, unknown if currently breastfeeding.  Recommendations: Based on my evaluation the patient does not appear to have an emergency medical condition.  Loletta Parish, NP 01/08/2021, 5:54 PM

## 2021-01-08 NOTE — BH Assessment (Addendum)
Comprehensive Clinical Assessment (CCA) Note  01/08/2021 Deborah Park [id number]  DISPOSITION: Leevy-Johnson NP recommends patient be discharged and follow up with resources provided.    Patient is a 35 year old female that presents this date as a voluntary walk in to [facility] requesting resources for ongoing mental health and SA issues. Patient reports passive S/I in the past although currently denies any thoughts to self harm. Patient denies any S/I,  H/I or AVH. Patient has a history significant for MDD, substance induced psychosis and polysubstance abuse. Patient states she is currently not receiving any OP services for mental health issues or prescribed any medications for symptom management. Patient denies any current SA issues although patient reported she used a unknown amount of cocaine and amphetamines two weeks ago. Patient states she recently lost her employment and resides alone. Patient states she has been managing her depressive symptoms although since she has lost her employment she has been "going through a rough patch." Patient states she "has been feeling down" and per notes has been seen multiple times for ongoing pain management issues. Patient contracts for safety and is requesting resources this date to assist with possibly receiving counseling and SA issues. Leevy-Johnson NP recommends patient be discharged with resources provided.     Chief Complaint:  Chief Complaint  Patient presents with   urgent emergent eval   Visit Diagnosis: Adjustment Disorder      CCA Screening, Triage and Referral (STR)  Patient Reported Information How did you hear about [place]? Self  What Is the Reason for Your Visit/Call Today? Ongoing S/I  How Long Has This Been Causing You Problems? 1 wk - 1 month  What Do You Feel Would Help You the Most Today? Stress Management; Medication(s)   Have You Recently Had Any Thoughts About Hurting Yourself? Yes  Are You Planning to Commit Suicide/Harm  Yourself At This time? No   Have you Recently Had Thoughts About Hurting Someone [name]? No  Are You Planning to Harm Someone at This Time? No  Explanation: No data recorded  Have You Used Any Alcohol or Drugs in the Past 24 Hours? No  How Long Ago Did You Use Drugs or Alcohol? No data recorded What Did You Use and How Much? No data recorded  Do You Currently Have a Therapist/Psychiatrist? No  Name of Therapist/Psychiatrist: No data recorded  Have You Been Recently Discharged From Any Office Practice or Programs? No  Explanation of Discharge From Practice/Program: No data recorded    CCA Screening Triage Referral Assessment Type of Contact: Face-to-Face  Telemedicine Service Delivery:   Is this Initial or Reassessment? No data recorded Date Telepsych consult ordered in [facility]:  No data recorded Time Telepsych consult ordered in CHL:  No data recorded Location of Assessment: [facility]  Provider Location: [facility]   Collateral Involvement: None at this time   Does Patient Have a Court Appointed Legal Guardian? No data recorded Name and Contact of Legal Guardian: No data recorded If Minor and Not Living with Parent(s), Who has Custody? NA  Is CPS involved or ever been involved? Never  Is APS involved or ever been involved? Never   Patient Determined To Be At Risk for Harm To Self or Others Based on Review of Patient Reported Information or Presenting Complaint? Yes, for Self-Harm  Method: No data recorded Availability of Means: No data recorded Intent: No data recorded Notification Required: No data recorded Additional Information for Danger to Others Potential: No data  recorded Additional Comments for Danger to Others Potential: No data recorded Are There Guns or Other Weapons in Your Home? No data recorded Types of Guns/Weapons: No data recorded Are These Weapons Safely Secured?                            No data recorded Who  Could Verify You Are Able To Have These Secured: No data recorded Do You Have any Outstanding Charges, Pending Court Dates, Parole/Probation? No data recorded Contacted To Inform of Risk of Harm To Self or Others: Other: Comment (NA)    Does Patient Present under Involuntary Commitment? No  IVC Papers Initial File Date: No data recorded  [place] of Residence: [place]   Patient Currently Receiving the Following Services: Medication Management   Determination of Need: Emergent (2 hours)   Options For Referral: Outpatient Therapy     CCA Biopsychosocial Patient Reported Schizophrenia/Schizoaffective Diagnosis in Past: No   Strengths: Pt is willing to participate in treatment   Mental Health Symptoms Depression:   Change in energy/activity; Difficulty Concentrating   Duration of Depressive symptoms:  Duration of Depressive Symptoms: Greater than two weeks   Mania:   None   Anxiety:    Difficulty concentrating; Fatigue   Psychosis:   None   Duration of Psychotic symptoms:    Trauma:   N/A   Obsessions:   None   Compulsions:   None   Inattention:   None   Hyperactivity/Impulsivity:   None   Oppositional/Defiant Behaviors:   None   Emotional Irregularity:   Chronic feelings of emptiness   Other Mood/Personality Symptoms:   NA    Mental Status Exam Appearance and self-care  Stature:   Small   Weight:   Thin   Clothing:   -- (In scrubs)   Grooming:   Normal   Cosmetic use:   None   Posture/gait:   Normal   Motor activity:   Restless   Sensorium  Attention:   Distractible   Concentration:   Anxiety interferes   Orientation:   X5   Recall/memory:   Normal   Affect and Mood  Affect:   Anxious   Mood:   Depressed   Relating  Eye contact:   Fleeting   Facial expression:   Responsive   Attitude toward examiner:   Cooperative   Thought and Language  Speech flow:  Clear and Coherent   Thought content:    Appropriate to Mood and Circumstances   Preoccupation:   None   Hallucinations:   None   Organization:  No data recorded  [employer] of Knowledge:   Average   Intelligence:   Average   Abstraction:   Normal   Judgement:   Good   Reality Testing:   Adequate   Insight:   Present   Decision Making:   Vacilates   Social Functioning  Social Maturity:   Irresponsible   Social Judgement:   Normal   Stress  Stressors:   Illness   Coping Ability:   Human resources officer Deficits:   Self-care   Supports:   Support needed     Religion: Religion/Spirituality Are You A Religious Person?: No  Leisure/Recreation: Leisure / Recreation Do You Have Hobbies?: No  Exercise/Diet: Exercise/Diet Do You Exercise?: No Have You Gained or Lost A Significant Amount of Weight in the Past Six Months?: No Do You Follow a Special Diet?: No Do You  Have Any Trouble Sleeping?: Yes Explanation of Sleeping Difficulties: Pt has a hx of sleep disturbance   CCA Employment/Education Employment/Work Situation: Employment / Work Situation Employment Situation: Employed Work Stressors: None reported Patient's Job has Been Impacted by Current Illness: No Has Patient ever Been in Equities trader?: No  Education:     CCA Family/Childhood History Family and Relationship History: Family history Marital status: Single (Domestic partner) Does patient have children?: Yes  Childhood History:  Childhood History By whom was/is the patient raised?: Mother Did patient suffer any verbal/emotional/physical/sexual abuse as a child?: No Did patient suffer from severe childhood neglect?: No Has patient ever been sexually abused/assaulted/raped as an adolescent or adult?: No Was the patient ever a victim of a crime or a disaster?: No Witnessed domestic violence?: No Has patient been affected by domestic violence as an adult?: No  Child/Adolescent Assessment:     CCA  Substance Use Alcohol/Drug Use: Alcohol / Drug Use Pain Medications: See MAR Prescriptions: See MAR Over the Counter: See MAR History of alcohol / drug use?: Yes Longest period of sobriety (when/how long): NA Negative Consequences of Use: Personal relationships Withdrawal Symptoms: None Substance #1 Name of Substance 1: Amphetamines 1 - Age of First Use: 33 1 - Amount (size/oz): Varies 1 - Frequency: Varies 1 - Duration: Ongoing 1 - Last Use / Amount: 01/04/21 amphetamines unknown amount   Substance #3 Name of Substance 3: Cocaine 3 - Age of First Use: 33 3 - Amount (size/oz): Varies 3 - Frequency: Varies 3 - Duration: Ongoing 3 - Last Use / Amount: Pt states over two weeks ago                   ASAM's:  Six Dimensions of Multidimensional Assessment  Dimension 1:  Acute Intoxication and/or Withdrawal Potential:   Dimension 1:  Description of individual's past and current experiences of substance use and withdrawal: Pt has a hx of psychoactive-induced psychosis  Dimension 2:  Biomedical Conditions and Complications:      Dimension 3:  Emotional, Behavioral, or Cognitive Conditions and Complications:  Dimension 3:  Description of emotional, behavioral, or cognitive conditions and complications: Pt has a hx of MDD, anxiety  Dimension 4:  Readiness to Change:     Dimension 5:  Relapse, Continued use, or Continued Problem Potential:     Dimension 6:  Recovery/Living Environment:     ASAM Severity Score: ASAM's Severity Rating Score: 13  ASAM Recommended Level of Treatment: ASAM Recommended Level of Treatment: Level I Outpatient Treatment   Substance use Disorder (SUD) Substance Use Disorder (SUD)  Checklist Symptoms of Substance Use: Continued use despite having a persistent/recurrent physical/psychological problem caused/exacerbated by use  Recommendations for Services/Supports/Treatments: Recommendations for Services/Supports/Treatments Recommendations For  Services/Supports/Treatments: Individual Therapy  Discharge Disposition:    DSM5 Diagnoses: Patient Active Problem List   Diagnosis Date Noted   Genetic carrier 06/05/2019   Screening for genetic disease carrier status 05/28/2019   AMA (advanced maternal age) multigravida 35+ 05/10/2019   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

## 2021-01-09 DIAGNOSIS — F4323 Adjustment disorder with mixed anxiety and depressed mood: Secondary | ICD-10-CM | POA: Diagnosis present

## 2021-03-03 ENCOUNTER — Emergency Department (HOSPITAL_COMMUNITY): Payer: Medicaid Other

## 2021-03-03 ENCOUNTER — Emergency Department (HOSPITAL_COMMUNITY)
Admission: EM | Admit: 2021-03-03 | Discharge: 2021-03-03 | Disposition: A | Discharge status: Home / Self Care | Payer: Medicaid Other | Attending: Emergency Medicine | Admitting: Emergency Medicine

## 2021-03-03 ENCOUNTER — Encounter (HOSPITAL_COMMUNITY): Payer: Self-pay

## 2021-03-03 ENCOUNTER — Emergency Department (HOSPITAL_COMMUNITY)
Admission: EM | Admit: 2021-03-03 | Discharge: 2021-03-03 | Disposition: A | Discharge status: Home / Self Care | Payer: Medicaid Other | Source: Home / Self Care

## 2021-03-03 ENCOUNTER — Other Ambulatory Visit: Payer: Self-pay

## 2021-03-03 DIAGNOSIS — R079 Chest pain, unspecified: Secondary | ICD-10-CM | POA: Insufficient documentation

## 2021-03-03 DIAGNOSIS — Z5321 Procedure and treatment not carried out due to patient leaving prior to being seen by health care provider: Secondary | ICD-10-CM | POA: Insufficient documentation

## 2021-03-03 DIAGNOSIS — R630 Anorexia: Secondary | ICD-10-CM | POA: Diagnosis not present

## 2021-03-03 DIAGNOSIS — Y900 Blood alcohol level of less than 20 mg/100 ml: Secondary | ICD-10-CM | POA: Insufficient documentation

## 2021-03-03 DIAGNOSIS — H538 Other visual disturbances: Secondary | ICD-10-CM | POA: Insufficient documentation

## 2021-03-03 LAB — BASIC METABOLIC PANEL
Anion gap: 7 (ref 5–15)
Anion gap: 8 (ref 5–15)
BUN: 13 mg/dL (ref 6–20)
BUN: 17 mg/dL (ref 6–20)
CO2: 27 mmol/L (ref 22–32)
CO2: 26 mmol/L (ref 22–32)
Calcium: 9 mg/dL (ref 8.9–10.3)
Calcium: 8.9 mg/dL (ref 8.9–10.3)
Chloride: 103 mmol/L (ref 98–111)
Chloride: 106 mmol/L (ref 98–111)
Creatinine, Ser: 0.61 mg/dL (ref 0.44–1.00)
Creatinine, Ser: 0.64 mg/dL (ref 0.44–1.00)
GFR, Estimated: >60 mL/min (ref >60)
GFR, Estimated: >60 mL/min (ref >60)
Glucose, Bld: 114 mg/dL — ABNORMAL HIGH (ref 70–99)
Glucose, Bld: 114 mg/dL — ABNORMAL HIGH (ref 70–99)
Potassium: 3.2 mmol/L — ABNORMAL LOW (ref 3.5–5.1)
Potassium: 3.8 mmol/L (ref 3.5–5.1)
Sodium: 137 mmol/L (ref 135–145)
Sodium: 140 mmol/L (ref 135–145)

## 2021-03-03 LAB — CBC
HCT: 42.3 % (ref 36.0–46.0)
HCT: 43 % (ref 36.0–46.0)
Hemoglobin: 14.4 g/dL (ref 12.0–15.0)
Hemoglobin: 14.7 g/dL (ref 12.0–15.0)
MCH: 33.6 pg (ref 26.0–34.0)
MCH: 33.7 pg (ref 26.0–34.0)
MCHC: 34 g/dL (ref 30.0–36.0)
MCHC: 34.2 g/dL (ref 30.0–36.0)
MCV: 98.4 fL (ref 80.0–100.0)
MCV: 99.1 fL (ref 80.0–100.0)
Platelets: 309 10*3/uL (ref 150–400)
Platelets: 312 10*3/uL (ref 150–400)
RBC: 4.27 MIL/uL (ref 3.87–5.11)
RBC: 4.37 MIL/uL (ref 3.87–5.11)
RDW: 12.3 % (ref 11.5–15.5)
RDW: 12.5 % (ref 11.5–15.5)
WBC: 12.5 10*3/uL — ABNORMAL HIGH (ref 4.0–10.5)
WBC: 7.9 10*3/uL (ref 4.0–10.5)
nRBC: 0 % (ref 0.0–0.2)
nRBC: 0 % (ref 0.0–0.2)

## 2021-03-03 LAB — I-STAT BETA HCG BLOOD, ED (MC, WL, AP ONLY): I-stat hCG, quantitative: <5 m[IU]/mL (ref <5)

## 2021-03-03 LAB — TROPONIN I (HIGH SENSITIVITY)
Troponin I (High Sensitivity): 2 ng/L (ref <18)
Troponin I (High Sensitivity): <2 ng/L (ref <18)
Troponin I (High Sensitivity): <2 ng/L (ref <18)

## 2021-03-03 LAB — RAPID URINE DRUG SCREEN, HOSP PERFORMED
Amphetamines: NOT DETECTED
Barbiturates: NOT DETECTED
Benzodiazepines: NOT DETECTED
Cocaine: POSITIVE — AB
Opiates: NOT DETECTED
Tetrahydrocannabinol: NOT DETECTED

## 2021-03-03 LAB — ETHANOL: Alcohol, Ethyl (B): <10 mg/dL (ref <10)

## 2021-03-03 LAB — TSH: TSH: 0.853 u[IU]/mL (ref 0.350–4.500)

## 2021-03-03 NOTE — ED Triage Notes (Signed)
PT was seen earlier this morning and had left the hospital at some point. Pt still c/o visual changes but denies cp. Labs and imaging were obtained earlier.

## 2021-03-03 NOTE — ED Notes (Signed)
Provided pt w/labeled specimen cup for urine collection per MD order. ENMiles 

## 2021-03-03 NOTE — ED Triage Notes (Signed)
Pt presents with multiple health complaints.   1) New generalized chest pains that recently began.   2) She is usually 5'2" but measures herself today and is 5'6" but does not know how that changed.   3) Sts a generalized uncomfortable feeling like she is inside of a body that is not hers.   5) Recurrent visual changes and blurriness.

## 2021-03-03 NOTE — ED Provider Notes (Signed)
Emergency Medicine Provider Triage Evaluation Note  Deborah Park , a 35 y.o. female  was evaluated in triage.  Pt complains of chest pain and blurry vision.  Was screened at 12:30 AM this morning but left before being seen in the back.  She is been having chest pain in the last few days, also having constant blurry vision which is worse than normal.  No pain with eye movements, no loss of vision.  Patient did 3 months of cocaine yesterday.   Review of Systems  Positive: Blurry vision, chest pain Negative: Fever  Physical Exam  BP 117/79   Pulse 89   Temp 98.3 F (36.8 C)   Resp 17   LMP 03/02/2021   SpO2 100%  Gen:   Awake, no distress   Resp:  Normal effort  MSK:   Moves extremities without difficulty  Other:  No nystagmus, EOMI without any pain with movement.  Medical Decision Making  Medically screening exam initiated at 1:18 PM.  Appropriate orders placed.  Deborah Park was informed that the remainder of the evaluation will be completed by another provider, this initial triage assessment does not replace that evaluation, and the importance of remaining in the ED until their evaluation is complete.   CT head yesterday was negative, will not repeat  Do not need to repeat today.  Chest x-ray performed yesterday negative, will not repeat today.  We will get a second EKG and check basic labs including troponin.   Theron Arista, PA-C 03/03/21 1321    Jacalyn Lefevre, MD 03/03/21 1325

## 2021-03-03 NOTE — ED Provider Notes (Signed)
Emergency Medicine Provider Triage Evaluation Note  Deborah Park , a 35 y.o. female  was evaluated in triage.  Pt complains of bilateral blurry vision, chest pain.  Patient reports centralized chest pain with neck tension.  Has had a history of this in the past with negative chest pain work-ups, but states that this feels worse today.  She also complains of weight loss and inability to keep on weight.  She also states that she used to be 5 2 and is now 5 6.  She states that she feels uncomfortable like her body is not hers.  She also reports worsening blurry vision, states that this is never been this bad before.  She denies any recent alcohol use but does endorse "3 hits" of cocaine earlier today due to stresses at work.  He denies headache, facial droop, difficulty speaking.  Review of Systems  Positive: As above Negative: As above  Physical Exam  BP (!) 133/95 (BP Location: Right Arm)   Pulse 94   Temp 98.4 F (36.9 C) (Oral)   Resp 16   Ht 5\' 2"  (1.575 m)   Wt 49.9 kg   SpO2 100%   BMI 20.12 kg/m  Gen:   Awake, no distress   Resp:  Normal effort, lung sounds clear MSK:   Moves extremities without difficulty  Other:  Vision grossly intact in bilateral eyes.  No visible irritation, discharge.  Pupils equal and reactive.  Smile equal, tongue protrusion without fasciculations.  Follows two-step commands without difficulty.  Equal strength in upper lower extremities bilaterally.  Medical Decision Making  Medically screening exam initiated at 12:37 AM.  Appropriate orders placed.  Deborah Park was informed that the remainder of the evaluation will be completed by another provider, this initial triage assessment does not replace that evaluation, and the importance of remaining in the ED until their evaluation is complete.  CT head for blurry vision and recent cocaine use, basic labs, troponin, TSH   , PA-C 03/03/21 0040    14/01/22, MD 03/03/21  [phone]

## 2021-03-12 ENCOUNTER — Encounter (HOSPITAL_COMMUNITY): Payer: Self-pay | Admitting: Emergency Medicine

## 2021-03-12 ENCOUNTER — Emergency Department (HOSPITAL_COMMUNITY)
Admission: EM | Admit: 2021-03-12 | Discharge: 2021-03-12 | Disposition: A | Discharge status: Home / Self Care | Payer: Medicaid Other | Attending: Emergency Medicine | Admitting: Emergency Medicine

## 2021-03-12 ENCOUNTER — Other Ambulatory Visit: Payer: Self-pay

## 2021-03-12 DIAGNOSIS — Z5321 Procedure and treatment not carried out due to patient leaving prior to being seen by health care provider: Secondary | ICD-10-CM | POA: Diagnosis not present

## 2021-03-12 DIAGNOSIS — R42 Dizziness and giddiness: Secondary | ICD-10-CM | POA: Insufficient documentation

## 2021-03-12 LAB — BASIC METABOLIC PANEL
Anion gap: 9 (ref 5–15)
BUN: 12 mg/dL (ref 6–20)
CO2: 25 mmol/L (ref 22–32)
Calcium: 8.9 mg/dL (ref 8.9–10.3)
Chloride: 104 mmol/L (ref 98–111)
Creatinine, Ser: 0.63 mg/dL (ref 0.44–1.00)
GFR, Estimated: >60 mL/min (ref >60)
Glucose, Bld: 79 mg/dL (ref 70–99)
Potassium: 3.7 mmol/L (ref 3.5–5.1)
Sodium: 138 mmol/L (ref 135–145)

## 2021-03-12 LAB — CBC WITH DIFFERENTIAL/PLATELET
Abs Immature Granulocytes: 0.03 10*3/uL (ref 0.00–0.07)
Basophils Absolute: 0.1 10*3/uL (ref 0.0–0.1)
Basophils Relative: 1 %
Eosinophils Absolute: 0.1 10*3/uL (ref 0.0–0.5)
Eosinophils Relative: 1 %
HCT: 45.3 % (ref 36.0–46.0)
Hemoglobin: 14.8 g/dL (ref 12.0–15.0)
Immature Granulocytes: 0 %
Lymphocytes Relative: 25 %
Lymphs Abs: 2.6 10*3/uL (ref 0.7–4.0)
MCH: 33 pg (ref 26.0–34.0)
MCHC: 32.7 g/dL (ref 30.0–36.0)
MCV: 100.9 fL — ABNORMAL HIGH (ref 80.0–100.0)
Monocytes Absolute: 0.8 10*3/uL (ref 0.1–1.0)
Monocytes Relative: 8 %
Neutro Abs: 6.7 10*3/uL (ref 1.7–7.7)
Neutrophils Relative %: 65 %
Platelets: 331 10*3/uL (ref 150–400)
RBC: 4.49 MIL/uL (ref 3.87–5.11)
RDW: 12.6 % (ref 11.5–15.5)
WBC: 10.4 10*3/uL (ref 4.0–10.5)
nRBC: 0 % (ref 0.0–0.2)

## 2021-03-12 NOTE — ED Notes (Signed)
UA sent down to lab.

## 2021-03-12 NOTE — ED Provider Notes (Signed)
Emergency Medicine Provider Triage Evaluation Note  Deborah Park , a 35 y.o. female  was evaluated in triage.  Pt complains of  dizziness when she is lying down.  Pt also reports food is not going down normally  Review of Systems  Positive: weakness Negative: Fever no covid or flu exposure   Physical Exam  BP 125/77 (BP Location: Left Arm)   Pulse 91   Temp 98.4 F (36.9 C) (Oral)   Resp 18   Ht 5\' 2"  (1.575 m)   Wt 51.3 kg   LMP 03/02/2021   SpO2 100%   BMI 20.67 kg/m  Gen:   Awake, no distress   Resp:  Normal effort  MSK:   Moves extremities without difficulty  Other:    Medical Decision Making  Medically screening exam initiated at 3:52 PM.  Appropriate orders placed.  Neyah A Ore was informed that the remainder of the evaluation will be completed by another provider, this initial triage assessment does not replace that evaluation, and the importance of remaining in the ED until their evaluation is complete.     03/04/2021, Elson Areas 03/12/21 1555    14/10/22, MD 03/12/21 [phone]

## 2021-03-12 NOTE — ED Triage Notes (Signed)
Reports dizziness while laying down the past few days, tingling in face and back the past few days and reports trouble swallowing food today as well. Pt was eating a banana and chips, states her food feels like it is not going down her esophagus.

## 2021-03-12 NOTE — ED Notes (Addendum)
Patient was called to recheck vital signs but no response. x1 

## 2021-03-12 NOTE — ED Notes (Signed)
Patient was called to recheck vital signs but no response. x2 

## 2021-03-13 ENCOUNTER — Emergency Department (HOSPITAL_COMMUNITY)
Admission: EM | Admit: 2021-03-13 | Discharge: 2021-03-13 | Disposition: A | Discharge status: Home / Self Care | Payer: Medicaid Other | Attending: Emergency Medicine | Admitting: Emergency Medicine

## 2021-03-13 DIAGNOSIS — F17210 Nicotine dependence, cigarettes, uncomplicated: Secondary | ICD-10-CM | POA: Diagnosis not present

## 2021-03-13 DIAGNOSIS — Z79899 Other long term (current) drug therapy: Secondary | ICD-10-CM | POA: Diagnosis not present

## 2021-03-13 DIAGNOSIS — H538 Other visual disturbances: Secondary | ICD-10-CM | POA: Diagnosis present

## 2021-03-13 DIAGNOSIS — M545 Low back pain, unspecified: Secondary | ICD-10-CM | POA: Diagnosis not present

## 2021-03-13 DIAGNOSIS — H5789 Other specified disorders of eye and adnexa: Secondary | ICD-10-CM

## 2021-03-13 LAB — URINALYSIS, ROUTINE W REFLEX MICROSCOPIC
Bilirubin Urine: NEGATIVE
Glucose, UA: NEGATIVE mg/dL
Hgb urine dipstick: NEGATIVE
Ketones, ur: NEGATIVE mg/dL
Leukocytes,Ua: NEGATIVE
Nitrite: NEGATIVE
Protein, ur: NEGATIVE mg/dL
Specific Gravity, Urine: 1.012 (ref 1.005–1.030)
pH: 6 (ref 5.0–8.0)

## 2021-03-13 LAB — PREGNANCY, URINE: Preg Test, Ur: NEGATIVE

## 2021-03-13 NOTE — ED Provider Notes (Signed)
[facility] DEPT Provider Note   CSN: [id number] Arrival date & time: 03/13/21  0388     History Chief Complaint  Patient presents with   Back Pain    Deborah Park is a 35 y.o. female.  Patient presents to the emergency department for evaluation of low back pain.  Patient states that she believes she has had an exposure to a fungus which is behind her eyes.  This causes occasional burning of her eyes.  Currently, she has had some burning of her eyes without injury, vision loss, redness.  She states that when this happens it causes her kidneys to hurt.  She describes bilateral mid back pain.  No associated dysuria, hematuria, increased frequency or urgency.  She has noted that her urine has gone from a dark color to a clear color.  No other abdominal pain.  No treatments prior to arrival.  Patient was concerned that she was developing a infection in the urine.      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[facility])    Supervision of high risk pregnancy, antepartum 05/08/2019   History of preterm delivery, currently pregnant 05/08/2019   Amphetamine abuse ([place]) 11/04/2018   MDD (major depressive disorder), recurrent severe, without psychosis ([place]) 11/04/2018   Psychoactive substance-induced psychosis ([place])    MDD (major depressive disorder), single episode, severe with psychosis ([place]) 08/29/2018   Polysubstance abuse ([place]) 02/14/2018   Memory loss 09/17/2013   Sleep disturbance 09/17/2013    Past Surgical History:  Procedure  Laterality Date   KIDNEY SURGERY       OB History     Gravida  4   Para  3   Term  1   Preterm  2   AB  0   Living  2      SAB  0   IAB      Ectopic      Multiple  0   Live Births  2           Family History  Problem Relation Age of Onset   Healthy Mother    Healthy Father    Bipolar disorder Maternal Grandmother    Seizures Neg Hx    Parkinsonism Neg Hx    Multiple sclerosis Neg Hx    Dementia Neg Hx     Social History   Tobacco Use   Smoking status: Every Day    Packs/day: 1.00    Years: 23.00    Pack years: 23.00    Types: Cigarettes   Smokeless tobacco: Never   Tobacco comments:    10 cigs/day  Vaping Use   Vaping Use: Never used  Substance Use Topics   Alcohol use: Not Currently   Drug use: Yes    Frequency: 1.0 times per week    Types: Cocaine    Home Medications Prior to Admission medications   Medication Sig Start Date End Date Taking? Authorizing Provider  Blood Pressure Monitoring (BLOOD PRESSURE KIT) DEVI 1 kit by Does not apply  route once a week. Check Blood Pressure regularly and record readings into the [employer] App.  Large Cuff.  DX O90.0 06/05/19   Woodroe Mode, MD  erythromycin ophthalmic ointment Place a 1/2 inch ribbon of ointment into the lower eyelid 3 times a day 11/08/20   Hayden Rasmussen, MD  ketorolac (ACULAR) 0.5 % ophthalmic solution Place 1 drop into both eyes 4 (four) times daily. 12/09/20   Menshew, Dannielle Karvonen, PA-C  ondansetron (ZOFRAN ODT) 4 MG disintegrating tablet Take 1 tablet (4 mg total) by mouth every 8 (eight) hours as needed for nausea or vomiting. 11/08/20   Khatri, Hina, PA-C  Prenat-FeAsp-Meth-FA-DHA w/o A (PRENATE PIXIE) 10-0.6-0.4-200 MG CAPS Take 1 tablet by mouth daily. 05/08/19   Woodroe Mode, MD  sodium chloride (OCEAN) 0.65 % SOLN nasal spray Place 1 spray into both nostrils as needed for congestion. 08/11/20   Carmin Muskrat, MD    Allergies    Patient has no known  allergies.  Review of Systems   Review of Systems  Constitutional:  Negative for fever.  HENT:  Negative for rhinorrhea and sore throat.   Eyes:  Positive for pain (Burning). Negative for redness.  Respiratory:  Negative for cough.   Cardiovascular:  Negative for chest pain.  Gastrointestinal:  Negative for abdominal pain, diarrhea, nausea and vomiting.  Genitourinary:  Negative for dysuria, frequency, hematuria and urgency.  Musculoskeletal:  Positive for back pain. Negative for myalgias.  Skin:  Negative for rash.  Neurological:  Negative for headaches.   Physical Exam Updated Vital Signs BP 124/87 (BP Location: Left Arm)   Pulse 93   Temp 97.9 F (36.6 C) (Oral)   Resp 17   LMP 03/02/2021   SpO2 97%   Physical Exam Vitals and nursing note reviewed.  Constitutional:      Appearance: She is well-developed.  HENT:     Head: Normocephalic and atraumatic.  Eyes:     General:        Right eye: No discharge.        Left eye: No discharge.     Extraocular Movements: Extraocular movements intact.     Conjunctiva/sclera: Conjunctivae normal.     Pupils: Pupils are equal, round, and reactive to light.     Comments: Globes appear normal bilaterally.  Conjunctive is clear.  Corneas clear.  Pulmonary:     Effort: No respiratory distress.  Abdominal:     Tenderness: There is no abdominal tenderness. There is no right CVA tenderness, left CVA tenderness or guarding.  Musculoskeletal:     Cervical back: Normal range of motion and neck supple.  Skin:    General: Skin is warm and dry.  Neurological:     Mental Status: She is alert.    ED Results / Procedures / Treatments   Labs (all labs ordered are listed, but only abnormal results are displayed) Labs Reviewed  URINALYSIS, ROUTINE W REFLEX MICROSCOPIC - Abnormal; Notable for the following components:      Result Value   APPearance HAZY (*)    All other components within normal limits  PREGNANCY, URINE     EKG None  Radiology No results found.  Procedures Procedures   Medications Ordered in ED Medications - No data to display  ED Course  I have reviewed the triage vital signs and the nursing notes.  Pertinent labs & imaging results that were available during my care of the patient were reviewed by me and considered in my  medical decision making (see chart for details).  Patient seen and examined.  Patient updated on UA results.  She seems reassured.  Offered Tylenol, she declines.  Encouraged use of OTC saline drops if needed or desired for her eyes.  Otherwise encourage PCP follow-up.  Vital signs reviewed and are as follows: BP 124/87 (BP Location: Left Arm)   Pulse 93   Temp 97.9 F (36.6 C) (Oral)   Resp 17   LMP 03/02/2021   SpO2 97%      MDM Rules/Calculators/A&P                           Patient with eye irritation and "kidney pain".  Urine is reassuring.  Exam is normal.  Encouraged symptomatic treatment and return with worsening.   Final Clinical Impression(s) / ED Diagnoses Final diagnoses:  Eye irritation  Acute bilateral low back pain without sciatica    Rx / DC Orders ED Discharge Orders     None        Carlisle Cater, PA-C 03/13/21 [place], MD 03/13/21 1530

## 2021-03-13 NOTE — ED Triage Notes (Addendum)
Patient presents with complaints of lower back pain that began approx. 4 hours ago. Patient denies urinary troubles. Patient also complaining of her eyes burning. Patient states that she felt like there was a fungus behind her eyes, but not anymore.

## 2021-03-13 NOTE — ED Notes (Signed)
Patient was called to take back to hallway bed but no response. x1

## 2021-03-13 NOTE — Discharge Instructions (Signed)
Please read and follow all provided instructions.  Your diagnoses today include:  1. Eye irritation   2. Acute bilateral low back pain without sciatica     Tests performed today include: Urine test: no signs of blood or infection Urine pregnancy test: negative Vital signs. See below for your results today.   Medications prescribed:  Use over-the-counter medications for symptoms as needed.  Take any prescribed medications only as directed.  Home care instructions:  Follow any educational materials contained in this packet.  BE VERY CAREFUL not to take multiple medicines containing Tylenol (also called acetaminophen). Doing so can lead to an overdose which can damage your liver and cause liver failure and possibly death.   Follow-up instructions: Please follow-up with your primary care provider in the next 3 days for further evaluation of your symptoms.   Return instructions:  Please return to the Emergency Department if you experience worsening symptoms.  Please return if you have any other emergent concerns.  Additional Information:  Your vital signs today were: BP 124/87 (BP Location: Left Arm)   Pulse 93   Temp 97.9 F (36.6 C) (Oral)   Resp 17   LMP 03/02/2021   SpO2 97%  If your blood pressure (BP) was elevated above 135/85 this visit, please have this repeated by your doctor within one month. --------------

## 2021-03-16 ENCOUNTER — Emergency Department (HOSPITAL_COMMUNITY)
Admission: EM | Admit: 2021-03-16 | Discharge: 2021-03-16 | Disposition: A | Discharge status: Home / Self Care | Payer: Medicaid Other | Attending: Emergency Medicine | Admitting: Emergency Medicine

## 2021-03-16 ENCOUNTER — Other Ambulatory Visit: Payer: Self-pay

## 2021-03-16 DIAGNOSIS — M549 Dorsalgia, unspecified: Secondary | ICD-10-CM | POA: Diagnosis not present

## 2021-03-16 DIAGNOSIS — R42 Dizziness and giddiness: Secondary | ICD-10-CM | POA: Diagnosis present

## 2021-03-16 DIAGNOSIS — Z5321 Procedure and treatment not carried out due to patient leaving prior to being seen by health care provider: Secondary | ICD-10-CM | POA: Insufficient documentation

## 2021-03-16 LAB — COMPREHENSIVE METABOLIC PANEL
ALT: 15 U/L (ref 0–44)
AST: 17 U/L (ref 15–41)
Albumin: 3.9 g/dL (ref 3.5–5.0)
Alkaline Phosphatase: 53 U/L (ref 38–126)
Anion gap: 8 (ref 5–15)
BUN: 9 mg/dL (ref 6–20)
CO2: 25 mmol/L (ref 22–32)
Calcium: 9.3 mg/dL (ref 8.9–10.3)
Chloride: 104 mmol/L (ref 98–111)
Creatinine, Ser: 0.71 mg/dL (ref 0.44–1.00)
GFR, Estimated: >60 mL/min (ref >60)
Glucose, Bld: 91 mg/dL (ref 70–99)
Potassium: 3.9 mmol/L (ref 3.5–5.1)
Sodium: 137 mmol/L (ref 135–145)
Total Bilirubin: 0.9 mg/dL (ref 0.3–1.2)
Total Protein: 7 g/dL (ref 6.5–8.1)

## 2021-03-16 LAB — CBC WITH DIFFERENTIAL/PLATELET
Abs Immature Granulocytes: 0.03 10*3/uL (ref 0.00–0.07)
Basophils Absolute: 0.1 10*3/uL (ref 0.0–0.1)
Basophils Relative: 1 %
Eosinophils Absolute: 0.1 10*3/uL (ref 0.0–0.5)
Eosinophils Relative: 1 %
HCT: 46.6 % — ABNORMAL HIGH (ref 36.0–46.0)
Hemoglobin: 15.8 g/dL — ABNORMAL HIGH (ref 12.0–15.0)
Immature Granulocytes: 0 %
Lymphocytes Relative: 18 %
Lymphs Abs: 2 10*3/uL (ref 0.7–4.0)
MCH: 33.6 pg (ref 26.0–34.0)
MCHC: 33.9 g/dL (ref 30.0–36.0)
MCV: 99.1 fL (ref 80.0–100.0)
Monocytes Absolute: 0.7 10*3/uL (ref 0.1–1.0)
Monocytes Relative: 6 %
Neutro Abs: 8.2 10*3/uL — ABNORMAL HIGH (ref 1.7–7.7)
Neutrophils Relative %: 74 %
Platelets: 337 10*3/uL (ref 150–400)
RBC: 4.7 MIL/uL (ref 3.87–5.11)
RDW: 12.3 % (ref 11.5–15.5)
WBC: 11.1 10*3/uL — ABNORMAL HIGH (ref 4.0–10.5)
nRBC: 0 % (ref 0.0–0.2)

## 2021-03-16 LAB — CBG MONITORING, ED: Glucose-Capillary: 97 mg/dL (ref 70–99)

## 2021-03-16 NOTE — ED Triage Notes (Signed)
Pt reports feeling sick only when she is at home. Feels dizzy and has back pain and feels like she has glue in her eyes, but as soon as she walks out her door she feels better. Per triage tech, patient was found upstairs trying to register under a different name, and when questioned by registration staff and this RN, pt adamant that Deborah Park is her name.

## 2021-03-16 NOTE — ED Provider Notes (Signed)
Emergency Medicine Provider Triage Evaluation Note  Deborah Park , a 35 y.o. female  was evaluated in triage.  Pt complains of dizziness, back pain for the past week but only when she is present in her own home.  States that when she walks out the door of her home she feels perfectly fine and asymptomatic.  She mostly has symptoms when she lays down in her bed.  Reports generalized body aches but also only in her home.  No chest pain, abdominal pain, headache, blurry vision or vomiting.  Review of Systems  Positive: Feeling ill at home Negative: Chest pain, shortness of breath, abdominal pain, headache  Physical Exam  LMP 02/20/2021 (Exact Date)  Gen:   Awake, no distress   Resp:  Normal effort  MSK:   Moves extremities without difficulty  Other:  No facial asymmetry, no aphasia  Medical Decision Making  Medically screening exam initiated at 11:08 AM.  Appropriate orders placed.  Deborah Park was informed that the remainder of the evaluation will be completed by another provider, this initial triage assessment does not replace that evaluation, and the importance of remaining in the ED until their evaluation is complete.  Labs ordered   Dietrich Pates, Cordelia Poche 03/16/21 1109    Wynetta Fines, MD 03/18/21 [phone]

## 2021-03-16 NOTE — ED Notes (Signed)
Called pt x3 for room, no response. Moving pt OTF.

## 2021-05-20 ENCOUNTER — Emergency Department (HOSPITAL_COMMUNITY)
Admission: EM | Admit: 2021-05-20 | Discharge: 2021-05-20 | Discharge status: Against Medical Advice | Payer: Medicaid Other | Source: Home / Self Care

## 2021-05-20 ENCOUNTER — Emergency Department (HOSPITAL_COMMUNITY)
Admission: EM | Admit: 2021-05-20 | Discharge: 2021-05-20 | Disposition: A | Discharge status: Home / Self Care | Payer: Medicaid Other | Attending: Emergency Medicine | Admitting: Emergency Medicine

## 2021-05-20 ENCOUNTER — Emergency Department (HOSPITAL_COMMUNITY): Payer: Medicaid Other

## 2021-05-20 ENCOUNTER — Encounter (HOSPITAL_COMMUNITY): Payer: Self-pay

## 2021-05-20 ENCOUNTER — Encounter (HOSPITAL_COMMUNITY): Payer: Self-pay | Admitting: Emergency Medicine

## 2021-05-20 ENCOUNTER — Other Ambulatory Visit: Payer: Self-pay

## 2021-05-20 DIAGNOSIS — R079 Chest pain, unspecified: Secondary | ICD-10-CM | POA: Insufficient documentation

## 2021-05-20 DIAGNOSIS — Z5321 Procedure and treatment not carried out due to patient leaving prior to being seen by health care provider: Secondary | ICD-10-CM | POA: Insufficient documentation

## 2021-05-20 DIAGNOSIS — R0789 Other chest pain: Secondary | ICD-10-CM | POA: Insufficient documentation

## 2021-05-20 DIAGNOSIS — R109 Unspecified abdominal pain: Secondary | ICD-10-CM | POA: Diagnosis not present

## 2021-05-20 DIAGNOSIS — R0602 Shortness of breath: Secondary | ICD-10-CM | POA: Insufficient documentation

## 2021-05-20 LAB — CBC
HCT: 45.4 % (ref 36.0–46.0)
Hemoglobin: 15.1 g/dL — ABNORMAL HIGH (ref 12.0–15.0)
MCH: 33 pg (ref 26.0–34.0)
MCHC: 33.3 g/dL (ref 30.0–36.0)
MCV: 99.3 fL (ref 80.0–100.0)
Platelets: 343 10*3/uL (ref 150–400)
RBC: 4.57 MIL/uL (ref 3.87–5.11)
RDW: 12.1 % (ref 11.5–15.5)
WBC: 10.5 10*3/uL (ref 4.0–10.5)
nRBC: 0 % (ref 0.0–0.2)

## 2021-05-20 LAB — COMPREHENSIVE METABOLIC PANEL
ALT: 16 U/L (ref 0–44)
AST: 16 U/L (ref 15–41)
Albumin: 4.5 g/dL (ref 3.5–5.0)
Alkaline Phosphatase: 54 U/L (ref 38–126)
Anion gap: 11 (ref 5–15)
BUN: 18 mg/dL (ref 6–20)
CO2: 24 mmol/L (ref 22–32)
Calcium: 9.2 mg/dL (ref 8.9–10.3)
Chloride: 105 mmol/L (ref 98–111)
Creatinine, Ser: 0.76 mg/dL (ref 0.44–1.00)
GFR, Estimated: >60 mL/min (ref >60)
Glucose, Bld: 93 mg/dL (ref 70–99)
Potassium: 3.7 mmol/L (ref 3.5–5.1)
Sodium: 140 mmol/L (ref 135–145)
Total Bilirubin: 1.3 mg/dL — ABNORMAL HIGH (ref 0.3–1.2)
Total Protein: 7.4 g/dL (ref 6.5–8.1)

## 2021-05-20 LAB — I-STAT BETA HCG BLOOD, ED (MC, WL, AP ONLY): I-stat hCG, quantitative: <5 m[IU]/mL (ref <5)

## 2021-05-20 LAB — TROPONIN I (HIGH SENSITIVITY): Troponin I (High Sensitivity): <2 ng/L (ref <18)

## 2021-05-20 NOTE — ED Notes (Signed)
Pt talking to herself stating "hes pressing on my back." Pt states "I can feel his heart sperate from mine." 1 EKG was obtained

## 2021-05-20 NOTE — ED Provider Triage Note (Signed)
Emergency Medicine Provider Triage Evaluation Note  Deborah Park , a 36 y.o. female  was evaluated in triage.  Pt complains of chest pain. States that same began this morning when she woke up. It is substernal in nature and radiates up and down. Endorses associated shortness of breath. No hx of cardiac problems.   Of note, patient told nursing staff there was an agent moving around inside of her with burning of her skin, patient denies these complaints with me. Also denies SI/HI  Review of Systems  Positive: Chest pain, shortness of breath Negative: Fevers, chills, nausea, vomiting  Physical Exam  BP (!) 142/89 (BP Location: Right Arm)    Pulse 92    Temp 98.7 F (37.1 C) (Oral)    Resp 14    SpO2 96%  Gen:   Awake, no distress   Resp:  Normal effort  MSK:   Moves extremities without difficulty  Other:    Medical Decision Making  Medically screening exam initiated at 2:27 PM.  Appropriate orders placed.  Deborah Park was informed that the remainder of the evaluation will be completed by another provider, this initial triage assessment does not replace that evaluation, and the importance of remaining in the ED until their evaluation is complete.     Bud Face, PA-C 05/20/21 1430

## 2021-05-20 NOTE — ED Triage Notes (Signed)
Patient states she began having mid chest pain approx 2 hours ago. Patient states she thinks she has come in contact with a toxic agent somehow. Patient states she may hve swallowed something.

## 2021-05-20 NOTE — ED Notes (Signed)
Pt didn't answered when called for vitals  

## 2021-05-20 NOTE — ED Triage Notes (Signed)
Pt here from home with c/ chest pain from an agent that is in her body and  the agent sometimes he moves to her back then her chest . No SI or HI but does have some psych hx

## 2021-05-20 NOTE — ED Notes (Signed)
Pt didn't answered when called for vitals x2  

## 2021-05-20 NOTE — ED Notes (Signed)
Patient states that this is not her chart and refused to answer any further questions in triage. Patient ripped her EKG leads off and stated she was going to check back in with a different name.  Patient very hostile verbally.

## 2021-05-21 ENCOUNTER — Emergency Department (HOSPITAL_COMMUNITY)
Admission: EM | Admit: 2021-05-21 | Discharge: 2021-05-21 | Disposition: A | Discharge status: Home / Self Care | Payer: Medicaid Other | Attending: Emergency Medicine | Admitting: Emergency Medicine

## 2021-05-21 DIAGNOSIS — R000 Tachycardia, unspecified: Secondary | ICD-10-CM | POA: Diagnosis not present

## 2021-05-21 DIAGNOSIS — R202 Paresthesia of skin: Secondary | ICD-10-CM | POA: Insufficient documentation

## 2021-05-21 DIAGNOSIS — M542 Cervicalgia: Secondary | ICD-10-CM | POA: Insufficient documentation

## 2021-05-21 NOTE — Discharge Instructions (Addendum)
Get help right away if: You have trouble breathing. You make loud, high-pitched sounds when you breathe, most often when you breathe in (stridor). You start to drool. You have trouble swallowing or pain when swallowing. You develop numbness or weakness in your hands or feet. You have changes in your speech, understanding, or vision. You are in severe pain. You cannot move your head or neck. 

## 2021-05-21 NOTE — ED Triage Notes (Signed)
Patient reports feeling neck pain a few hours ago, heard bones cracking, having trouble feeling internal organs. Pain in triage 8/10

## 2021-05-21 NOTE — ED Notes (Signed)
Pt left triage and went to registration desk and attempted to check back in under a man's name.

## 2021-05-21 NOTE — ED Provider Notes (Signed)
[facility] DEPT Provider Note   CSN: [id number] Arrival date & time: 05/21/21  0946     History  Chief Complaint  Patient presents with   Neck Pain    Deborah Park is a 36 y.o. female with past medical history of anxiety, depression, psychoactive substance induced psychosis, polysubstance use, Chari malformation type II.  Presents to the emergency department with a chief complaint of hearing cracking noises in her neck and "trouble feeling my internal organs."  Patient reports that she woke this morning and heard "bones cracking in my neck."  Patient states that she had a sudden jolt of pain that radiated to her entire back and then resolved.  Patient has had no pain since then.  Patient states that after that she felt a numbness throughout her entire chest and states that she is having "trouble feeling maternal organs."    Patient denies any recent falls or injuries.  Endorses occasional alcohol use.  No alcohol use recently.  Endorses cocaine use.  Last used cocaine 1 week prior.  Denies any facial asymmetry, dysarthria, weakness, saddle paresthesia, visual disturbance, bowel/bladder incontinence, IV drug use, history of cancer, chest pain, shortness of breath.     Neck Pain Associated symptoms: numbness   Associated symptoms: no chest pain, no fever, no headaches and no weakness       Home Medications Prior to Admission medications   Medication Sig Start Date End Date Taking? Authorizing Provider  Blood Pressure Monitoring (BLOOD PRESSURE KIT) DEVI 1 kit by Does not apply route once a week. Check Blood Pressure regularly and record readings into the [employer] App.  Large Cuff.  DX O90.0 06/05/19   Woodroe Mode, MD  erythromycin ophthalmic ointment Place a 1/2 inch ribbon of ointment into the lower eyelid 3 times a day 11/08/20   Hayden Rasmussen, MD  ketorolac (ACULAR) 0.5 % ophthalmic solution Place 1 drop into both eyes 4 (four) times daily.  12/09/20   Menshew, Dannielle Karvonen, PA-C  ondansetron (ZOFRAN ODT) 4 MG disintegrating tablet Take 1 tablet (4 mg total) by mouth every 8 (eight) hours as needed for nausea or vomiting. 11/08/20   Khatri, Hina, PA-C  Prenat-FeAsp-Meth-FA-DHA w/o A (PRENATE PIXIE) 10-0.6-0.4-200 MG CAPS Take 1 tablet by mouth daily. 05/08/19   Woodroe Mode, MD  sodium chloride (OCEAN) 0.65 % SOLN nasal spray Place 1 spray into both nostrils as needed for congestion. 08/11/20   Carmin Muskrat, MD      Allergies    Patient has no known allergies.    Review of Systems   Review of Systems  Constitutional:  Negative for chills and fever.  Eyes:  Negative for visual disturbance.  Respiratory:  Negative for shortness of breath.   Cardiovascular:  Negative for chest pain.  Gastrointestinal:  Negative for abdominal pain, nausea and vomiting.  Genitourinary:  Negative for difficulty urinating and dysuria.  Musculoskeletal:  Positive for back pain and neck pain.  Skin:  Negative for color change and rash.  Neurological:  Positive for numbness. Negative for dizziness, tremors, seizures, syncope, facial asymmetry, speech difficulty, weakness, light-headedness and headaches.  Psychiatric/Behavioral:  Negative for confusion.    Physical Exam Updated Vital Signs BP 134/89 (BP Location: Left Arm)    Pulse (!) 108    Temp 97.9 F (36.6 C) (Oral)    Resp 16    Ht '5\' 6"'  (1.676 m)    Wt 49.9 kg    SpO2 99%  BMI 17.75 kg/m  Physical Exam Vitals and nursing note reviewed.  Constitutional:      General: She is not in acute distress.    Appearance: She is not ill-appearing, toxic-appearing or diaphoretic.  Eyes:     General: No scleral icterus.       Right eye: No discharge.        Left eye: No discharge.     Extraocular Movements: Extraocular movements intact.     Conjunctiva/sclera: Conjunctivae normal.     Pupils: Pupils are equal, round, and reactive to light.  Cardiovascular:     Rate and Rhythm: Tachycardia  present.     Pulses:          Radial pulses are 2+ on the right side and 2+ on the left side.     Heart sounds: Normal heart sounds. No murmur heard. Pulmonary:     Effort: Pulmonary effort is normal. No tachypnea, bradypnea or respiratory distress.     Breath sounds: Normal breath sounds. No stridor.  Chest:     Chest wall: No tenderness.  Abdominal:     General: Abdomen is flat.     Palpations: Abdomen is soft. There is no mass or pulsatile mass.     Tenderness: There is no abdominal tenderness. There is no guarding or rebound.  Musculoskeletal:     Cervical back: Full passive range of motion without pain, normal range of motion and neck supple. No swelling, edema, deformity, erythema, signs of trauma, lacerations, rigidity, spasms, torticollis, tenderness, bony tenderness or crepitus. No pain with movement, spinous process tenderness or muscular tenderness. Normal range of motion.     Thoracic back: No swelling, edema, deformity, signs of trauma, lacerations, spasms, tenderness or bony tenderness. Normal range of motion.     Lumbar back: No swelling, edema, deformity, signs of trauma, lacerations, spasms, tenderness or bony tenderness. Normal range of motion.     Right lower leg: No edema.     Left lower leg: No edema.     Comments: No midline tenderness or deformity to cervical, thoracic, or lumbar spine.  Skin:    General: Skin is warm and dry.  Neurological:     General: No focal deficit present.     Mental Status: She is alert and oriented to person, place, and time.     GCS: GCS eye subscore is 4. GCS verbal subscore is 5. GCS motor subscore is 6.     Cranial Nerves: No cranial nerve deficit or facial asymmetry.     Sensory: Sensation is intact.     Motor: No weakness, tremor, seizure activity or pronator drift.     Coordination: Romberg sign negative. Finger-Nose-Finger Test normal.     Gait: Gait is intact. Gait normal.     Comments: CN II-XII intact, equal grip strength, +5  strength to bilateral upper and lower extremities, sensation to light touch gross intact to bilateral upper and lower extremities  Psychiatric:        Attention and Perception: She is attentive. She does not perceive auditory or visual hallucinations.        Behavior: Behavior is cooperative.        Thought Content: Thought content does not include homicidal or suicidal ideation. Thought content does not include homicidal or suicidal plan.    ED Results / Procedures / Treatments   Labs (all labs ordered are listed, but only abnormal results are displayed) Labs Reviewed - No data to display  EKG None  Radiology  DG Chest 2 View  Result Date: 05/20/2021 CLINICAL DATA:  Chest pain EXAM: CHEST - 2 VIEW COMPARISON:  03/03/2021 FINDINGS: The heart size and mediastinal contours are within normal limits. Both lungs are clear. The visualized skeletal structures are unremarkable. IMPRESSION: No active cardiopulmonary disease. Electronically Signed   By: Yvonne Kendall M.D.   On: 05/20/2021 14:41    Procedures Procedures    Medications Ordered in ED Medications - No data to display  ED Course/ Medical Decision Making/ A&P                           Medical Decision Making  Alert 36 year old female no acute distress, nontoxic-appearing.  Presents to the ED with a chief complaint of earing cracking noises in her neck and "trouble feeling my internal organs."  Information was obtained from patient.  Past medical records were reviewed including previous provider notes and labs.  Patient has past medical history as noted in HPI which complicates her care.  Patient is alert to person, place, and time.  Denies any SI, HI, AVH.  Patient neurologic exam is reassuring.  No midline tenderness or deformity to cervical, thoracic, lumbar spine.  Patient has full range of motion to cervical neck.  Patient denies any bowel/bladder function, saddle paresthesia, fever, chills, IV drug use, history of cancer.   Suspect that her symptoms may be psych related.  Patient does not acutely psychotic with no SI, HI, AVH.  Does not meet inpatient criteria for psych evaluation at this time.  We will discharge patient to follow-up with her PCP in outpatient setting.  Discussed results, findings, treatment and follow up. Patient advised of return precautions. Patient verbalized understanding and agreed with plan.  Patient care discussed with attending physician Dr.Lawsing         Final Clinical Impression(s) / ED Diagnoses Final diagnoses:  None    Rx / DC Orders ED Discharge Orders     None         Loni Beckwith, PA-C 05/21/21 1051    Regan Lemming, MD 05/21/21 1131

## 2021-05-21 NOTE — ED Notes (Signed)
Went to d/c the patient and she was not in room. Did not return Chiropractor provided before she left. Not visualized in restrooms, lobby or department, assumed to have left before d/c instructions were provided.

## 2021-05-22 ENCOUNTER — Other Ambulatory Visit: Payer: Self-pay

## 2021-05-22 ENCOUNTER — Encounter: Payer: Self-pay | Admitting: Emergency Medicine

## 2021-05-22 ENCOUNTER — Emergency Department: Payer: Medicaid Other

## 2021-05-22 ENCOUNTER — Emergency Department
Admission: EM | Admit: 2021-05-22 | Discharge: 2021-05-22 | Disposition: A | Discharge status: Home / Self Care | Payer: Medicaid Other | Attending: Emergency Medicine | Admitting: Emergency Medicine

## 2021-05-22 DIAGNOSIS — R102 Pelvic and perineal pain: Secondary | ICD-10-CM | POA: Insufficient documentation

## 2021-05-22 DIAGNOSIS — W19XXXA Unspecified fall, initial encounter: Secondary | ICD-10-CM | POA: Insufficient documentation

## 2021-05-22 DIAGNOSIS — N939 Abnormal uterine and vaginal bleeding, unspecified: Secondary | ICD-10-CM | POA: Diagnosis present

## 2021-05-22 DIAGNOSIS — K625 Hemorrhage of anus and rectum: Secondary | ICD-10-CM | POA: Insufficient documentation

## 2021-05-22 DIAGNOSIS — R079 Chest pain, unspecified: Secondary | ICD-10-CM | POA: Diagnosis not present

## 2021-05-22 LAB — HCG, QUANTITATIVE, PREGNANCY: hCG, Beta Chain, Quant, S: <1 m[IU]/mL (ref <5)

## 2021-05-22 LAB — TROPONIN I (HIGH SENSITIVITY): Troponin I (High Sensitivity): 2 ng/L (ref <18)

## 2021-05-22 LAB — WET PREP, GENITAL
Clue Cells Wet Prep HPF POC: NONE SEEN
Sperm: NONE SEEN
Trich, Wet Prep: NONE SEEN
WBC, Wet Prep HPF POC: >=10 — AB (ref <10)
Yeast Wet Prep HPF POC: NONE SEEN

## 2021-05-22 LAB — HEPATIC FUNCTION PANEL
ALT: 14 U/L (ref 0–44)
AST: 20 U/L (ref 15–41)
Albumin: 4.3 g/dL (ref 3.5–5.0)
Alkaline Phosphatase: 52 U/L (ref 38–126)
Bilirubin, Direct: 0.2 mg/dL (ref 0.0–0.2)
Indirect Bilirubin: 2.5 mg/dL — ABNORMAL HIGH (ref 0.3–0.9)
Total Bilirubin: 2.7 mg/dL — ABNORMAL HIGH (ref 0.3–1.2)
Total Protein: 7.5 g/dL (ref 6.5–8.1)

## 2021-05-22 LAB — BASIC METABOLIC PANEL
Anion gap: 9 (ref 5–15)
BUN: 12 mg/dL (ref 6–20)
CO2: 25 mmol/L (ref 22–32)
Calcium: 9.2 mg/dL (ref 8.9–10.3)
Chloride: 102 mmol/L (ref 98–111)
Creatinine, Ser: 0.58 mg/dL (ref 0.44–1.00)
GFR, Estimated: >60 mL/min (ref >60)
Glucose, Bld: 106 mg/dL — ABNORMAL HIGH (ref 70–99)
Potassium: 3.4 mmol/L — ABNORMAL LOW (ref 3.5–5.1)
Sodium: 136 mmol/L (ref 135–145)

## 2021-05-22 LAB — URINE DRUG SCREEN, QUALITATIVE (ARMC ONLY)
Amphetamines, Ur Screen: NOT DETECTED
Barbiturates, Ur Screen: NOT DETECTED
Benzodiazepine, Ur Scrn: NOT DETECTED
Cannabinoid 50 Ng, Ur ~~LOC~~: NOT DETECTED
Cocaine Metabolite,Ur ~~LOC~~: NOT DETECTED
MDMA (Ecstasy)Ur Screen: NOT DETECTED
Methadone Scn, Ur: NOT DETECTED
Opiate, Ur Screen: NOT DETECTED
Phencyclidine (PCP) Ur S: NOT DETECTED
Tricyclic, Ur Screen: NOT DETECTED

## 2021-05-22 LAB — CBC
HCT: 46.4 % — ABNORMAL HIGH (ref 36.0–46.0)
Hemoglobin: 15.7 g/dL — ABNORMAL HIGH (ref 12.0–15.0)
MCH: 32.5 pg (ref 26.0–34.0)
MCHC: 33.8 g/dL (ref 30.0–36.0)
MCV: 96.1 fL (ref 80.0–100.0)
Platelets: 341 10*3/uL (ref 150–400)
RBC: 4.83 MIL/uL (ref 3.87–5.11)
RDW: 11.9 % (ref 11.5–15.5)
WBC: 11.4 10*3/uL — ABNORMAL HIGH (ref 4.0–10.5)
nRBC: 0 % (ref 0.0–0.2)

## 2021-05-22 LAB — CHLAMYDIA/NGC RT PCR (ARMC ONLY)
Chlamydia Tr: NOT DETECTED
N gonorrhoeae: NOT DETECTED

## 2021-05-22 LAB — LIPASE, BLOOD: Lipase: 25 U/L (ref 11–51)

## 2021-05-22 LAB — ETHANOL: Alcohol, Ethyl (B): <10 mg/dL (ref <10)

## 2021-05-22 MED ORDER — IBUPROFEN 800 MG PO TABS
800.0000 mg | ORAL_TABLET | Freq: Once | ORAL | Status: AC
  Administered 2021-05-22: 800 mg via ORAL
  Filled 2021-05-22: qty 1

## 2021-05-22 NOTE — ED Provider Notes (Signed)
[facility] Emergency Department Provider Note     Event Date/Time   First MD Initiated Contact with Patient 05/22/21 1258     (approximate)   History   Chest Pain, Rectal Bleeding, and Vaginal Bleeding   HPI  Deborah Park is a 36 y.o. female with a history of polysubstance abuse, major depressive disorder, and anxiety presents to the ED from home, with complaints of chest pain, rectal bleeding, and vaginal bleeding.  Patient reports symptoms started after a fall 2 days ago.  She apparently is having regular menstrual periods is not on any birth control at this time, and notes that her LMP was about 4 weeks ago.  Patient denies any recent sexual intercourse, vaginal discharge but does endorse some pelvic pain.  She denies any fevers, chills, sweats, cough, or congestion.     Physical Exam   Triage Vital Signs: ED Triage Vitals  Enc Vitals Group     BP 05/22/21 1302 117/85     Pulse Rate 05/22/21 1302 97     Resp 05/22/21 1302 13     Temp 05/22/21 1220 98.8 F (37.1 C)     Temp Source 05/22/21 1220 Oral     SpO2 05/22/21 1302 99 %     Weight 05/22/21 1221 107 lb (48.5 kg)     Height 05/22/21 1221 5\' 2"  (1.575 m)     Head Circumference --      Peak Flow --      Pain Score 05/22/21 1221 7     Pain Loc --      Pain Edu? --      Excl. in GC? --     Most recent vital signs: Vitals:   05/22/21 1220 05/22/21 1302  BP:  117/85  Pulse:  97  Resp:  13  Temp: 98.8 F (37.1 C)   SpO2:  99%    General Awake, no distress.  CV:  Good peripheral perfusion. RRR. No peripheral edema RESP:  Normal effort. CTA ABD:  No distention. Soft, nontender.  No rebound, guarding, or rigidity.  No external hemorrhoids appreciated.  Normal rectal tone.  Negative stool guaiac. GU:   Normal external genitalia.  Patient with dark blood in the vault as well as blood from the os consistent with a likely menstrual bleeding.  No CMT or adnexal masses  appreciated.   ED Results / Procedures / Treatments   Labs (all labs ordered are listed, but only abnormal results are displayed) Labs Reviewed  WET PREP, GENITAL - Abnormal; Notable for the following components:      Result Value   WBC, Wet Prep HPF POC >=10 (*)    All other components within normal limits  BASIC METABOLIC PANEL - Abnormal; Notable for the following components:   Potassium 3.4 (*)    Glucose, Bld 106 (*)    All other components within normal limits  CBC - Abnormal; Notable for the following components:   WBC 11.4 (*)    Hemoglobin 15.7 (*)    HCT 46.4 (*)    All other components within normal limits  HEPATIC FUNCTION PANEL - Abnormal; Notable for the following components:   Total Bilirubin 2.7 (*)    Indirect Bilirubin 2.5 (*)    All other components within normal limits  CHLAMYDIA/NGC RT PCR ([facility] ONLY)            ETHANOL  URINE DRUG SCREEN, QUALITATIVE ([facility] ONLY)  LIPASE, BLOOD  HCG, QUANTITATIVE, PREGNANCY  URINALYSIS, ROUTINE W REFLEX MICROSCOPIC  POC URINE PREG, ED  TROPONIN I (HIGH SENSITIVITY)  TROPONIN I (HIGH SENSITIVITY)     EKG   RADIOLOGY  I personally viewed and evaluated these images as part of my medical decision making, as well as reviewing the written report by the radiologist.  ED Provider Interpretation: no acute findings}  DG Chest 2 View  Result Date: 05/22/2021 CLINICAL DATA:  Chest pain EXAM: CHEST - 2 VIEW COMPARISON:  Radiograph 05/20/2021 FINDINGS: The heart size and mediastinal contours are within normal limits.No focal airspace disease. No pleural effusion or pneumothorax.No acute osseous abnormality. IMPRESSION: No evidence of acute cardiopulmonary disease. Electronically Signed   By: Caprice Renshaw M.D.   On: 05/22/2021 12:58   US PELVIS (TRANSABDOMINAL ONLY)  Result Date: 05/22/2021 CLINICAL DATA:  Pelvic pain, vaginal bleeding EXAM: TRANSABDOMINAL ULTRASOUND OF PELVIS DOPPLER ULTRASOUND OF OVARIES TECHNIQUE:  Transabdominal ultrasound examination of the pelvis was performed including evaluation of the uterus, ovaries, adnexal regions, and pelvic cul-de-sac. Color and duplex Doppler ultrasound was utilized to evaluate blood flow to the ovaries. COMPARISON:  None. FINDINGS: Uterus Measurements: 8.3 x 4.4 x 6.9 cm = volume: 129.4 mL. No fibroids or other mass visualized. Endometrium Thickness: 9 mm. This is normal for age. No focal abnormality visualized. Right ovary Measurements: 3.8 x 1.8 x 1.8 cm = volume: 6.6 mL. Normal appearance/no adnexal mass. Left ovary Measurements: 2.5 x 1.9 x 2.0 cm = volume: 3.5 mL. Normal appearance/no adnexal mass. Pulsed Doppler evaluation demonstrates normal low-resistance arterial and venous waveforms in both ovaries. Other: None IMPRESSION: Normal pelvic ultrasound. No evidence of ovarian torsion. Electronically Signed   By: Caprice Renshaw M.D.   On: 05/22/2021 16:22   US PELVIC DOPPLER (TORSION R/O OR MASS ARTERIAL FLOW)  Result Date: 05/22/2021 CLINICAL DATA:  Pelvic pain, vaginal bleeding EXAM: TRANSABDOMINAL ULTRASOUND OF PELVIS DOPPLER ULTRASOUND OF OVARIES TECHNIQUE: Transabdominal ultrasound examination of the pelvis was performed including evaluation of the uterus, ovaries, adnexal regions, and pelvic cul-de-sac. Color and duplex Doppler ultrasound was utilized to evaluate blood flow to the ovaries. COMPARISON:  None. FINDINGS: Uterus Measurements: 8.3 x 4.4 x 6.9 cm = volume: 129.4 mL. No fibroids or other mass visualized. Endometrium Thickness: 9 mm. This is normal for age. No focal abnormality visualized. Right ovary Measurements: 3.8 x 1.8 x 1.8 cm = volume: 6.6 mL. Normal appearance/no adnexal mass. Left ovary Measurements: 2.5 x 1.9 x 2.0 cm = volume: 3.5 mL. Normal appearance/no adnexal mass. Pulsed Doppler evaluation demonstrates normal low-resistance arterial and venous waveforms in both ovaries. Other: None IMPRESSION: Normal pelvic ultrasound. No evidence of ovarian  torsion. Electronically Signed   By: Caprice Renshaw M.D.   On: 05/22/2021 16:22     PROCEDURES:  Critical Care performed: No  Procedures   MEDICATIONS ORDERED IN ED: Medications  ibuprofen (ADVIL) tablet 800 mg (800 mg Oral Given 05/22/21 1659)     IMPRESSION / MDM / ASSESSMENT AND PLAN / ED COURSE  I reviewed the triage vital signs and the nursing notes.                              Differential diagnosis includes, but is not limited to, hemorrhoids, rectal fissure, pregnancy, ectopic, ovarian cyst, ovarian torsion, vaginitis, PID, ACS, CAP  The patient is on the cardiac monitor to evaluate for evidence of arrhythmia and/or significant heart rate changes.  Patient with ED evaluation of complaints including  chest pain, vaginal rectal bleeding, presenting to the ED.  Patient evaluated focalize the ED, found overall reassuring work-up.  No p evidence of critical anemia or significant leukocytosis on CBC.  BMP was without any significant electrolyte abnormalities.  Troponin is negative x2, and lipase is within normal limits and UDS is negative.  No laboratory evidence of cervicitis or PID. Patient's diagnosis is consistent with menstrual bleeding. Patient is to follow up with her PCP as needed or otherwise directed. Patient is given ED precautions to return to the ED for any worsening or new symptoms.   FINAL CLINICAL IMPRESSION(S) / ED DIAGNOSES   Final diagnoses:  Vaginal bleeding  Pelvic pain     Rx / DC Orders   ED Discharge Orders     None        Note:  This document was prepared using Dragon voice recognition software and may include unintentional dictation errors.    Lissa Hoard, PA-C 05/22/21 1930    Concha Se, MD 05/24/21 1500

## 2021-05-22 NOTE — Discharge Instructions (Addendum)
Your exam, labs, and ultrasound are all normal and reassuring at this time.  Your symptoms are likely due to normal menstrual pain and cramping.  Take over-the-counter Tylenol and Motrin as needed.  Follow-up with your primary provider for ongoing symptoms.

## 2021-05-22 NOTE — ED Triage Notes (Signed)
Pt reports that she is having chest pain, and rectal and vaginal bleeding from a fall two days ago. Pt is now stating that when she has a certain phone in her possession that it causes her to have medical issues and that she has been told that her voices changes on it.

## 2021-05-23 ENCOUNTER — Other Ambulatory Visit: Payer: Self-pay

## 2021-05-23 ENCOUNTER — Emergency Department (HOSPITAL_COMMUNITY)
Admission: EM | Admit: 2021-05-23 | Discharge: 2021-05-23 | Disposition: A | Discharge status: Home / Self Care | Payer: Medicaid Other | Attending: Emergency Medicine | Admitting: Emergency Medicine

## 2021-05-23 ENCOUNTER — Encounter (HOSPITAL_COMMUNITY): Payer: Self-pay

## 2021-05-23 DIAGNOSIS — Z711 Person with feared health complaint in whom no diagnosis is made: Secondary | ICD-10-CM

## 2021-05-23 DIAGNOSIS — R102 Pelvic and perineal pain: Secondary | ICD-10-CM | POA: Diagnosis not present

## 2021-05-23 DIAGNOSIS — K068 Other specified disorders of gingiva and edentulous alveolar ridge: Secondary | ICD-10-CM | POA: Insufficient documentation

## 2021-05-23 LAB — I-STAT CHEM 8, ED
BUN: 14 mg/dL (ref 6–20)
Calcium, Ion: 1.08 mmol/L — ABNORMAL LOW (ref 1.15–1.40)
Chloride: 100 mmol/L (ref 98–111)
Creatinine, Ser: 0.6 mg/dL (ref 0.44–1.00)
Glucose, Bld: 94 mg/dL (ref 70–99)
HCT: 49 % — ABNORMAL HIGH (ref 36.0–46.0)
Hemoglobin: 16.7 g/dL — ABNORMAL HIGH (ref 12.0–15.0)
Potassium: 3.4 mmol/L — ABNORMAL LOW (ref 3.5–5.1)
Sodium: 138 mmol/L (ref 135–145)
TCO2: 25 mmol/L (ref 22–32)

## 2021-05-23 LAB — I-STAT BETA HCG BLOOD, ED (MC, WL, AP ONLY): I-stat hCG, quantitative: <5 m[IU]/mL (ref <5)

## 2021-05-23 NOTE — ED Provider Notes (Signed)
[facility] DEPT Provider Note   CSN: [id number] Arrival date & time: 05/23/21  0121     History  Chief Complaint  Patient presents with   mouth bleeding    Deborah Park is a 36 y.o. female.  Patient presents stating that she has "internal bleeding".  When asked to further elaborate, she reports that she was walking down the street earlier and noticed that there was blood in her mouth.  She denies biting her tongue, has not found any wounds.  No recent dental procedures.  She reports that she did not have any nosebleed.  No mouth pain.  No seizure.      Home Medications Prior to Admission medications   Medication Sig Start Date End Date Taking? Authorizing Provider  Blood Pressure Monitoring (BLOOD PRESSURE KIT) DEVI 1 kit by Does not apply route once a week. Check Blood Pressure regularly and record readings into the [employer] App.  Large Cuff.  DX O90.0 06/05/19   Woodroe Mode, MD  erythromycin ophthalmic ointment Place a 1/2 inch ribbon of ointment into the lower eyelid 3 times a day 11/08/20   Hayden Rasmussen, MD  ketorolac (ACULAR) 0.5 % ophthalmic solution Place 1 drop into both eyes 4 (four) times daily. 12/09/20   Menshew, Dannielle Karvonen, PA-C  ondansetron (ZOFRAN ODT) 4 MG disintegrating tablet Take 1 tablet (4 mg total) by mouth every 8 (eight) hours as needed for nausea or vomiting. 11/08/20   Khatri, Hina, PA-C  Prenat-FeAsp-Meth-FA-DHA w/o A (PRENATE PIXIE) 10-0.6-0.4-200 MG CAPS Take 1 tablet by mouth daily. 05/08/19   Woodroe Mode, MD  sodium chloride (OCEAN) 0.65 % SOLN nasal spray Place 1 spray into both nostrils as needed for congestion. 08/11/20   Carmin Muskrat, MD      Allergies    Patient has no known allergies.    Review of Systems   Review of Systems  HENT:  Negative for nosebleeds.    Physical Exam Updated Vital Signs BP 126/88    Pulse 88    Temp 98.3 F (36.8 C) (Oral)    Resp 18    SpO2 98%  Physical  Exam Vitals and nursing note reviewed.  Constitutional:      General: She is not in acute distress.    Appearance: She is well-developed.  HENT:     Head: Normocephalic and atraumatic.     Mouth/Throat:     Mouth: Mucous membranes are moist.  Eyes:     General: Vision grossly intact. Gaze aligned appropriately.     Extraocular Movements: Extraocular movements intact.     Conjunctiva/sclera: Conjunctivae normal.  Cardiovascular:     Rate and Rhythm: Normal rate and regular rhythm.     Pulses: Normal pulses.     Heart sounds: Normal heart sounds, S1 normal and S2 normal. No murmur heard.   No friction rub. No gallop.  Pulmonary:     Effort: Pulmonary effort is normal. No respiratory distress.     Breath sounds: Normal breath sounds.  Abdominal:     General: Bowel sounds are normal.     Palpations: Abdomen is soft.     Tenderness: There is no abdominal tenderness. There is no guarding or rebound.     Hernia: No hernia is present.  Musculoskeletal:        General: No swelling.     Cervical back: Full passive range of motion without pain, normal range of motion and neck supple. No spinous  process tenderness or muscular tenderness. Normal range of motion.     Right lower leg: No edema.     Left lower leg: No edema.  Skin:    General: Skin is warm and dry.     Capillary Refill: Capillary refill takes less than 2 seconds.     Findings: No ecchymosis, erythema, rash or wound.  Neurological:     General: No focal deficit present.     Mental Status: She is alert and oriented to person, place, and time.     GCS: GCS eye subscore is 4. GCS verbal subscore is 5. GCS motor subscore is 6.     Cranial Nerves: Cranial nerves 2-12 are intact.     Sensory: Sensation is intact.     Motor: Motor function is intact.     Coordination: Coordination is intact.  Psychiatric:        Attention and Perception: She is inattentive.        Mood and Affect: Affect is flat.        Speech: Speech is  delayed.        Behavior: Behavior is withdrawn.        Thought Content: Thought content does not include homicidal or suicidal ideation.    ED Results / Procedures / Treatments   Labs (all labs ordered are listed, but only abnormal results are displayed) Labs Reviewed  I-STAT CHEM 8, ED - Abnormal; Notable for the following components:      Result Value   Potassium 3.4 (*)    Calcium, Ion 1.08 (*)    Hemoglobin 16.7 (*)    HCT 49.0 (*)    All other components within normal limits  I-STAT BETA HCG BLOOD, ED ([facility], [facility], [facility] ONLY)    EKG None  Radiology DG Chest 2 View  Result Date: 05/22/2021 CLINICAL DATA:  Chest pain EXAM: CHEST - 2 VIEW COMPARISON:  Radiograph 05/20/2021 FINDINGS: The heart size and mediastinal contours are within normal limits.No focal airspace disease. No pleural effusion or pneumothorax.No acute osseous abnormality. IMPRESSION: No evidence of acute cardiopulmonary disease. Electronically Signed   By: Maurine Simmering M.D.   On: 05/22/2021 12:58   US PELVIS (TRANSABDOMINAL ONLY)  Result Date: 05/22/2021 CLINICAL DATA:  Pelvic pain, vaginal bleeding EXAM: TRANSABDOMINAL ULTRASOUND OF PELVIS DOPPLER ULTRASOUND OF OVARIES TECHNIQUE: Transabdominal ultrasound examination of the pelvis was performed including evaluation of the uterus, ovaries, adnexal regions, and pelvic cul-de-sac. Color and duplex Doppler ultrasound was utilized to evaluate blood flow to the ovaries. COMPARISON:  None. FINDINGS: Uterus Measurements: 8.3 x 4.4 x 6.9 cm = volume: 129.4 mL. No fibroids or other mass visualized. Endometrium Thickness: 9 mm. This is normal for age. No focal abnormality visualized. Right ovary Measurements: 3.8 x 1.8 x 1.8 cm = volume: 6.6 mL. Normal appearance/no adnexal mass. Left ovary Measurements: 2.5 x 1.9 x 2.0 cm = volume: 3.5 mL. Normal appearance/no adnexal mass. Pulsed Doppler evaluation demonstrates normal low-resistance arterial and venous waveforms in both ovaries.  Other: None IMPRESSION: Normal pelvic ultrasound. No evidence of ovarian torsion. Electronically Signed   By: Maurine Simmering M.D.   On: 05/22/2021 16:22   US PELVIC DOPPLER (TORSION R/O OR MASS ARTERIAL FLOW)  Result Date: 05/22/2021 CLINICAL DATA:  Pelvic pain, vaginal bleeding EXAM: TRANSABDOMINAL ULTRASOUND OF PELVIS DOPPLER ULTRASOUND OF OVARIES TECHNIQUE: Transabdominal ultrasound examination of the pelvis was performed including evaluation of the uterus, ovaries, adnexal regions, and pelvic cul-de-sac. Color and duplex Doppler ultrasound was utilized to  evaluate blood flow to the ovaries. COMPARISON:  None. FINDINGS: Uterus Measurements: 8.3 x 4.4 x 6.9 cm = volume: 129.4 mL. No fibroids or other mass visualized. Endometrium Thickness: 9 mm. This is normal for age. No focal abnormality visualized. Right ovary Measurements: 3.8 x 1.8 x 1.8 cm = volume: 6.6 mL. Normal appearance/no adnexal mass. Left ovary Measurements: 2.5 x 1.9 x 2.0 cm = volume: 3.5 mL. Normal appearance/no adnexal mass. Pulsed Doppler evaluation demonstrates normal low-resistance arterial and venous waveforms in both ovaries. Other: None IMPRESSION: Normal pelvic ultrasound. No evidence of ovarian torsion. Electronically Signed   By: Maurine Simmering M.D.   On: 05/22/2021 16:22    Procedures Procedures    Medications Ordered in ED Medications - No data to display  ED Course/ Medical Decision Making/ A&P                           Medical Decision Making  Patient presents to the emergency department for concerns over bleeding in her mouth.  Oral examination is entirely normal.  No lesions.  She does not have any petechiae in her mouth, no evidence of bruising or bleeding elsewhere.  Reviewing her records reveals that she had a full work-up at [facility] yesterday with complaints of rectal bleeding and vaginal bleeding.  CBC was normal at that time including normal platelets and actually elevated hemoglobin.  Hemoglobin is  stable today.  No further work-up necessary.         Final Clinical Impression(s) / ED Diagnoses Final diagnoses:  Worried well    Rx / DC Orders ED Discharge Orders     None         Myrta Mercer, Gwenyth Allegra, MD 05/23/21 0301

## 2021-05-23 NOTE — ED Triage Notes (Signed)
Pt states that she is having internal bleeding. Pt states that her mouth was bleeding x 2 days ago.

## 2021-06-02 ENCOUNTER — Emergency Department
Admission: EM | Admit: 2021-06-02 | Discharge: 2021-06-02 | Discharge status: Against Medical Advice | Payer: Medicaid Other | Attending: Emergency Medicine | Admitting: Emergency Medicine

## 2021-06-02 ENCOUNTER — Encounter: Payer: Self-pay | Admitting: Emergency Medicine

## 2021-06-02 ENCOUNTER — Other Ambulatory Visit: Payer: Self-pay

## 2021-06-02 DIAGNOSIS — D72829 Elevated white blood cell count, unspecified: Secondary | ICD-10-CM | POA: Diagnosis not present

## 2021-06-02 DIAGNOSIS — Z20822 Contact with and (suspected) exposure to covid-19: Secondary | ICD-10-CM | POA: Diagnosis not present

## 2021-06-02 DIAGNOSIS — B9789 Other viral agents as the cause of diseases classified elsewhere: Secondary | ICD-10-CM | POA: Diagnosis not present

## 2021-06-02 DIAGNOSIS — R110 Nausea: Secondary | ICD-10-CM

## 2021-06-02 DIAGNOSIS — R1084 Generalized abdominal pain: Secondary | ICD-10-CM

## 2021-06-02 DIAGNOSIS — J988 Other specified respiratory disorders: Secondary | ICD-10-CM | POA: Insufficient documentation

## 2021-06-02 DIAGNOSIS — R509 Fever, unspecified: Secondary | ICD-10-CM | POA: Diagnosis present

## 2021-06-02 LAB — CBC
HCT: 44.3 % (ref 36.0–46.0)
Hemoglobin: 14.9 g/dL (ref 12.0–15.0)
MCH: 33 pg (ref 26.0–34.0)
MCHC: 33.6 g/dL (ref 30.0–36.0)
MCV: 98.2 fL (ref 80.0–100.0)
Platelets: 325 10*3/uL (ref 150–400)
RBC: 4.51 MIL/uL (ref 3.87–5.11)
RDW: 11.9 % (ref 11.5–15.5)
WBC: 13 10*3/uL — ABNORMAL HIGH (ref 4.0–10.5)
nRBC: 0 % (ref 0.0–0.2)

## 2021-06-02 LAB — URINALYSIS, COMPLETE (UACMP) WITH MICROSCOPIC
Bacteria, UA: NONE SEEN
Bilirubin Urine: NEGATIVE
Glucose, UA: NEGATIVE mg/dL
Hgb urine dipstick: NEGATIVE
Ketones, ur: 5 mg/dL — AB
Leukocytes,Ua: NEGATIVE
Nitrite: NEGATIVE
Protein, ur: NEGATIVE mg/dL
Specific Gravity, Urine: 1.031 — ABNORMAL HIGH (ref 1.005–1.030)
pH: 5 (ref 5.0–8.0)

## 2021-06-02 LAB — BASIC METABOLIC PANEL
Anion gap: 10 (ref 5–15)
BUN: 21 mg/dL — ABNORMAL HIGH (ref 6–20)
CO2: 23 mmol/L (ref 22–32)
Calcium: 9.3 mg/dL (ref 8.9–10.3)
Chloride: 105 mmol/L (ref 98–111)
Creatinine, Ser: 0.77 mg/dL (ref 0.44–1.00)
GFR, Estimated: >60 mL/min (ref >60)
Glucose, Bld: 90 mg/dL (ref 70–99)
Potassium: 3.6 mmol/L (ref 3.5–5.1)
Sodium: 138 mmol/L (ref 135–145)

## 2021-06-02 LAB — PREGNANCY, URINE: Preg Test, Ur: NEGATIVE

## 2021-06-02 LAB — RESP PANEL BY RT-PCR (FLU A&B, COVID) ARPGX2
Influenza A by PCR: NEGATIVE
Influenza B by PCR: NEGATIVE
SARS Coronavirus 2 by RT PCR: NEGATIVE

## 2021-06-02 NOTE — ED Provider Notes (Addendum)
?[facility] EMERGENCY DEPARTMENT ?Provider Note ? ? ?CSN: [id number] ?Arrival date & time: 06/02/21  1723 ? ?  ? ?History ? ?Chief Complaint  ?Patient presents with  ? Fever  ? ? ?Deborah Park is a 36 y.o. female presents to the emergency department for evaluation of several days of fever, headache, cough, nausea, abdominal burning/cramping.  No vomiting or diarrhea.  Patient thought she had COVID.  She has been fully vaccinated for COVID.  She denies any chest pain, shortness of breath.  She states has had fevers as high as 102 but afebrile here in the emergency department.  She has taken Tylenol and ibuprofen today.  She states her abdominal pain is mild. ? ?HPI ? ?  ? ?Home Medications ?Prior to Admission medications   ?Medication Sig Start Date End Date Taking? Authorizing Provider  ?Blood Pressure Monitoring (BLOOD PRESSURE KIT) DEVI 1 kit by Does not apply route once a week. Check Blood Pressure regularly and record readings into the [employer] App.  Large Cuff.  DX O90.0 06/05/19   Woodroe Mode, MD  ?erythromycin ophthalmic ointment Place a 1/2 inch ribbon of ointment into the lower eyelid 3 times a day 11/08/20   Hayden Rasmussen, MD  ?ketorolac (ACULAR) 0.5 % ophthalmic solution Place 1 drop into both eyes 4 (four) times daily. 12/09/20   Menshew, Dannielle Karvonen, PA-C  ?ondansetron (ZOFRAN ODT) 4 MG disintegrating tablet Take 1 tablet (4 mg total) by mouth every 8 (eight) hours as needed for nausea or vomiting. 11/08/20   Delia Heady, PA-C  ?Prenat-FeAsp-Meth-FA-DHA w/o A (PRENATE PIXIE) 10-0.6-0.4-200 MG CAPS Take 1 tablet by mouth daily. 05/08/19   Woodroe Mode, MD  ?sodium chloride (OCEAN) 0.65 % SOLN nasal spray Place 1 spray into both nostrils as needed for congestion. 08/11/20   Carmin Muskrat, MD  ?   ? ?Allergies    ?Patient has no known allergies.   ? ?Review of Systems   ?Review of Systems ? ?Physical Exam ?Updated Vital Signs ?BP (!) 125/93 (BP Location: Left Arm)    Pulse 90   Temp 98.7 ?F (37.1 ?C) (Oral)   Resp 15   Ht 5' 2" (1.575 m)   Wt 40.8 kg   SpO2 98%   BMI 16.46 kg/m?  ?Physical Exam ?Constitutional:   ?   Appearance: She is well-developed.  ?HENT:  ?   Head: Normocephalic and atraumatic.  ?   Right Ear: Tympanic membrane normal.  ?   Ears:  ?   Comments: Left TM with fluid present, slight bulging, no redness ?   Nose: Nose normal.  ?   Mouth/Throat:  ?   Pharynx: No oropharyngeal exudate or posterior oropharyngeal erythema.  ?Eyes:  ?   Extraocular Movements: Extraocular movements intact.  ?   Conjunctiva/sclera: Conjunctivae normal.  ?   Pupils: Pupils are equal, round, and reactive to light.  ?Cardiovascular:  ?   Rate and Rhythm: Normal rate.  ?   Pulses: Normal pulses.  ?   Heart sounds: Normal heart sounds.  ?Pulmonary:  ?   Effort: Pulmonary effort is normal. No respiratory distress.  ?   Breath sounds: Normal breath sounds. No stridor. No wheezing.  ?Abdominal:  ?   General: Abdomen is flat. There is no distension.  ?   Tenderness: There is no abdominal tenderness. There is no guarding.  ?Musculoskeletal:     ?   General: Normal range of motion.  ?   Cervical  back: Normal range of motion.  ?Lymphadenopathy:  ?   Cervical: Cervical adenopathy present.  ?Skin: ?   General: Skin is warm.  ?   Findings: No rash.  ?Neurological:  ?   General: No focal deficit present.  ?   Mental Status: She is alert and oriented to person, place, and time. Mental status is at baseline.  ?Psychiatric:     ?   Behavior: Behavior normal.     ?   Thought Content: Thought content normal.  ? ? ?ED Results / Procedures / Treatments   ?Labs ?(all labs ordered are listed, but only abnormal results are displayed) ?Labs Reviewed  ?URINALYSIS, COMPLETE (UACMP) WITH MICROSCOPIC - Abnormal; Notable for the following components:  ?    Result Value  ? Color, Urine YELLOW (*)   ? APPearance HAZY (*)   ? Specific Gravity, Urine 1.031 (*)   ? Ketones, ur 5 (*)   ? All other components within  normal limits  ?CBC - Abnormal; Notable for the following components:  ? WBC 13.0 (*)   ? All other components within normal limits  ?BASIC METABOLIC PANEL - Abnormal; Notable for the following components:  ? BUN 21 (*)   ? All other components within normal limits  ?RESP PANEL BY RT-PCR (FLU A&B, COVID) ARPGX2  ?PREGNANCY, URINE  ? ? ?EKG ?None ? ?Radiology ?No results found. ? ?Procedures ?Procedures  ? ? ?Medications Ordered in ED ?Medications - No data to display ? ?ED Course/ Medical Decision Making/ A&P ?  ?                        ?Medical Decision Making ?Amount and/or Complexity of Data Reviewed ?Labs: ordered. ? ? ? ?Final Clinical Impression(s) / ED Diagnoses ?Final diagnoses:  ?Viral respiratory illness  ?Generalized abdominal pain  ?Nausea  ?36 year old female with flulike symptoms.  Vital signs are stable, afebrile nontachycardic.  Flu COVID strep test negative.  Normal urinalysis.  Patient requesting blood alert which is obtained showing slight leukocytosis.  Urinalysis and blood work concerning for mild dehydration.  Patient's physical exam shows no significant abdominal tenderness, distention or guarding.  She has mild posterior cervical lymphadenopathy with no signs of any bacterial infectious process.  Lungs are clear to auscultation with no wheezing rales or rhonchi.  After lab work returned, went to discuss blood work with patient, patient eloped. ? ?Rx / DC Orders ?ED Discharge Orders   ? ? None  ? ?  ? ? ?  ?Duanne Guess, PA-C ?06/02/21 2057 ? ?  ?Duanne Guess, PA-C ?06/02/21 2058 ? ?  ?Vanessa Grayridge, MD ?06/03/21 1147 ? ?

## 2021-06-02 NOTE — ED Provider Triage Note (Signed)
Emergency Medicine Provider Triage Evaluation Note ? ?Deborah Park , a 36 y.o. female  was evaluated in triage.  Pt complains of several days of fever, headache cough nausea abdominal burning/cramping.  She thinks she may have COVID but no known contacts.  She has been vaccinated for COVID.  She denies any chest pain.  No shortness of breath.  Fevers have been as high as 102.  She has taken Tylenol and ibuprofen today.. ? ?Review of Systems  ?Positive: Flulike symptoms ?Negative: Chest pain shortness of breath ? ?Physical Exam  ?There were no vitals taken for this visit. ?Gen:   Awake, no distress   ?Resp:  Normal effort  ?MSK:   Moves extremities without difficulty  ?Other:   ? ?Medical Decision Making  ?Medically screening exam initiated at 6:06 PM.  Appropriate orders placed.  Siren A Kang was informed that the remainder of the evaluation will be completed by another provider, this initial triage assessment does not replace that evaluation, and the importance of remaining in the ED until their evaluation is complete. ? ? ?  ?Evon Slack, PA-C ?06/02/21 1807 ? ?

## 2021-06-02 NOTE — ED Notes (Signed)
Pt not found in room at this time by nurse, provider. Eloped. Unable to explain risks of leaving.  ?

## 2021-06-02 NOTE — ED Triage Notes (Signed)
Pt in via POV, reports fever, headache, cough, nausea, abdominal pain, ongoing x 2 days now.  Concerns for Covid.  Vitals WDL, NAD noted at this time.   ?

## 2021-06-03 ENCOUNTER — Emergency Department (HOSPITAL_COMMUNITY)
Admission: EM | Admit: 2021-06-03 | Discharge: 2021-06-04 | Disposition: A | Discharge status: Home / Self Care | Payer: Medicaid Other | Attending: Emergency Medicine | Admitting: Emergency Medicine

## 2021-06-03 ENCOUNTER — Encounter (HOSPITAL_COMMUNITY): Payer: Self-pay

## 2021-06-03 ENCOUNTER — Other Ambulatory Visit: Payer: Self-pay

## 2021-06-03 DIAGNOSIS — R6889 Other general symptoms and signs: Secondary | ICD-10-CM

## 2021-06-03 DIAGNOSIS — R451 Restlessness and agitation: Secondary | ICD-10-CM | POA: Insufficient documentation

## 2021-06-03 DIAGNOSIS — R1110 Vomiting, unspecified: Secondary | ICD-10-CM | POA: Insufficient documentation

## 2021-06-03 HISTORY — DX: Essential (primary) hypertension: I10

## 2021-06-03 NOTE — ED Triage Notes (Signed)
Patient has been vomiting today. Cannot tell me when she started, cannot tell me how many times. She said she is having back and nerve pain too. Abdominal pain is lower abdomen and centralized. No radiation. Her back pain she said is going from her "scrotom to her skull."  ?

## 2021-06-04 LAB — COMPREHENSIVE METABOLIC PANEL
ALT: 12 U/L (ref 0–44)
AST: 18 U/L (ref 15–41)
Albumin: 4.3 g/dL (ref 3.5–5.0)
Alkaline Phosphatase: 59 U/L (ref 38–126)
Anion gap: 9 (ref 5–15)
BUN: 19 mg/dL (ref 6–20)
CO2: 23 mmol/L (ref 22–32)
Calcium: 9.3 mg/dL (ref 8.9–10.3)
Chloride: 105 mmol/L (ref 98–111)
Creatinine, Ser: 0.79 mg/dL (ref 0.44–1.00)
GFR, Estimated: >60 mL/min (ref >60)
Glucose, Bld: 119 mg/dL — ABNORMAL HIGH (ref 70–99)
Potassium: 3.6 mmol/L (ref 3.5–5.1)
Sodium: 137 mmol/L (ref 135–145)
Total Bilirubin: 0.8 mg/dL (ref 0.3–1.2)
Total Protein: 7.4 g/dL (ref 6.5–8.1)

## 2021-06-04 LAB — URINALYSIS, ROUTINE W REFLEX MICROSCOPIC
Bilirubin Urine: NEGATIVE
Glucose, UA: NEGATIVE mg/dL
Hgb urine dipstick: NEGATIVE
Ketones, ur: 5 mg/dL — AB
Leukocytes,Ua: NEGATIVE
Nitrite: NEGATIVE
Protein, ur: 30 mg/dL — AB
Specific Gravity, Urine: 1.026 (ref 1.005–1.030)
pH: 5 (ref 5.0–8.0)

## 2021-06-04 LAB — CBC
HCT: 44.7 % (ref 36.0–46.0)
Hemoglobin: 15.2 g/dL — ABNORMAL HIGH (ref 12.0–15.0)
MCH: 33.4 pg (ref 26.0–34.0)
MCHC: 34 g/dL (ref 30.0–36.0)
MCV: 98.2 fL (ref 80.0–100.0)
Platelets: 328 10*3/uL (ref 150–400)
RBC: 4.55 MIL/uL (ref 3.87–5.11)
RDW: 12.1 % (ref 11.5–15.5)
WBC: 13.2 10*3/uL — ABNORMAL HIGH (ref 4.0–10.5)
nRBC: 0 % (ref 0.0–0.2)

## 2021-06-04 LAB — LIPASE, BLOOD: Lipase: 26 U/L (ref 11–51)

## 2021-06-04 NOTE — ED Notes (Signed)
Pt blk mobile phone found while cleaning room after discharge. Phone placed in labeled biohazard bag and placed in lost and found box in ED Asst. Director's office. Hulen Shouts RN, Jake RN advised. ENMiles ?

## 2021-06-04 NOTE — ED Provider Notes (Signed)
?[facility]-EMERGENCY DEPT ?Provider Note ? ? ?CSN: [id number] ?Arrival date & time: 06/03/21  2326 ? ?  ? ?History ? ?Chief Complaint  ?Patient presents with  ? Vomiting  ? ? ?Deborah Park is a 36 y.o. female. ? ?36 year old female to the emergency department for multiple complaints.  She presented in triage with complaints of vomiting.  During my encounter with her she reports that she has been spitting up phlegm.  She denies any emesis of stomach contents.  Now states that she has been tasting blood in her mouth.  She is unsure where this is coming from.  She denies any oral trauma or seeing any blood in her mouth.  Denies use of chronic anticoagulants.  Furthermore complaining of diffuse "nerve pain".  She denies taking any medications for this.  Has not seen a primary care doctor in some time. ? ?The history is provided by the patient. No language interpreter was used.  ? ?  ? ?Home Medications ?Prior to Admission medications   ?Not on File  ?   ? ?Allergies    ?Patient has no known allergies.   ? ?Review of Systems   ?Review of Systems ?Ten systems reviewed and are negative for acute change, except as noted in the HPI.  ? ? ?Physical Exam ?Updated Vital Signs ?BP (!) 131/98   Pulse 89   Temp 98.7 ?F (37.1 ?C) (Oral)   Resp 18 Comment: Simultaneous filing. User may not have seen previous data.  Ht 5\' 6"  (1.676 m)   Wt 49 kg   SpO2 99%   BMI 17.43 kg/m?  ?Physical Exam ?Vitals and nursing note reviewed.  ?Constitutional:   ?   General: She is not in acute distress. ?   Appearance: She is well-developed. She is not diaphoretic.  ?   Comments: Anxious appearing  ?HENT:  ?   Head: Normocephalic and atraumatic.  ?   Right Ear: Tympanic membrane, ear canal and external ear normal.  ?   Left Ear: Ear canal and external ear normal.  ?   Ears:  ?   Comments: Minimal erythema to the internal L ear canal without changes to the external L ear. No bulging, retraction, perforation of either  TM. ?   Mouth/Throat:  ?   Mouth: Mucous membranes are moist.  ?   Comments: No evidence of blood in the oropharynx. No gingival bleeding or signs or oral trauma. ?Eyes:  ?   General: No scleral icterus. ?   Extraocular Movements: Extraocular movements intact.  ?   Conjunctiva/sclera: Conjunctivae normal.  ?Cardiovascular:  ?   Rate and Rhythm: Normal rate and regular rhythm.  ?   Pulses: Normal pulses.  ?Pulmonary:  ?   Effort: Pulmonary effort is normal. No respiratory distress.  ?   Breath sounds: No stridor. No wheezing.  ?   Comments: Lungs CTAB. Respirations even and unlabored. ?Musculoskeletal:     ?   General: Normal range of motion.  ?   Cervical back: Normal range of motion.  ?Skin: ?   General: Skin is warm and dry.  ?   Coloration: Skin is not pale.  ?   Findings: No erythema or rash.  ?Neurological:  ?   Mental Status: She is alert and oriented to person, place, and time.  ?   Coordination: Coordination normal.  ?   Comments: GCS 15. Moving all extremities spontaneously.  ?Psychiatric:     ?   Mood and Affect:  Mood is anxious. Affect is tearful.     ?   Behavior: Behavior is agitated and withdrawn.  ? ? ?ED Results / Procedures / Treatments   ?Labs ?(all labs ordered are listed, but only abnormal results are displayed) ?Labs Reviewed  ?COMPREHENSIVE METABOLIC PANEL - Abnormal; Notable for the following components:  ?    Result Value  ? Glucose, Bld 119 (*)   ? All other components within normal limits  ?CBC - Abnormal; Notable for the following components:  ? WBC 13.2 (*)   ? Hemoglobin 15.2 (*)   ? All other components within normal limits  ?URINALYSIS, ROUTINE W REFLEX MICROSCOPIC - Abnormal; Notable for the following components:  ? APPearance HAZY (*)   ? Ketones, ur 5 (*)   ? Protein, ur 30 (*)   ? Bacteria, UA RARE (*)   ? All other components within normal limits  ?LIPASE, BLOOD  ?PREGNANCY, URINE  ? ? ?EKG ?None ? ?Radiology ?No results found. ? ?Procedures ?Procedures  ? ? ?Medications Ordered  in ED ?Medications - No data to display ? ?ED Course/ Medical Decision Making/ A&P ?  ?                        ?Medical Decision Making ?Amount and/or Complexity of Data Reviewed ?Labs: ordered. ? ? ?This patient presents to the ED for concern of multiple complaints, this involves an extensive number of treatment options, and is a complaint that carries with it a high risk of complications and morbidity.  ? ? ?Co morbidities that complicate the patient evaluation ? ?Mental disorder ?Polysubstance abuse ? ? ?Additional history obtained: ? ?External records from outside source obtained and reviewed including prior imaging studies under chart marked for merge ? ? ?Lab Tests: ? ?I Ordered, and personally interpreted labs.  The pertinent results include:  leukocytosis of 13.2 (stable compared to 48 hours ago). Stable H/H. No electrolyte derangements. Liver and kidney function preserved. UA negative for UTI. ? ? ?Cardiac Monitoring: ? ?The patient was maintained on a cardiac monitor.  I personally viewed and interpreted the cardiac monitored which showed an underlying rhythm of: NSR ? ? ?Test Considered: ? ?Abdominal ultrasound ? ? ?Reevaluation: ? ?After the interventions noted above, I reevaluated the patient and found that they have : remained stable ? ? ?Social Determinants of Health: ? ?Insured  ? ? ?Dispostion: ? ?After consideration of the diagnostic results and the patients response to treatment, I feel that the patent would benefit from repeat evaluation by her PCP as well as referral to outpatient neurology.  ? ?On chart review, this is the patient's 7th visit to the ED in 2 weeks for various complaints. Her labs today are stable and unchanged compared to 48 hours ago when evaluated at [facility]. No present concern for emergent process. No criteria for SIRS/sepsis. Return precautions discussed and provided. Patient discharged in stable condition with no unaddressed concerns. ? ? ? ? ? ? ? ? ?Final Clinical  Impression(s) / ED Diagnoses ?Final diagnoses:  ?Multiple complaints  ? ? ?Rx / DC Orders ?ED Discharge Orders   ? ?      Ordered  ?  Ambulatory referral to Neurology       ?Comments: An appointment is requested in approximately: 4 weeks  ? 06/04/21 0229  ? ?  ?  ? ?  ? ? ?  ?Antony Madura, PA-C ?06/04/21 9735 ? ?  ?Paula Libra, MD ?06/04/21 0407 ? ?

## 2021-06-04 NOTE — Discharge Instructions (Signed)
We recommend ibuprofen or Naproxen for pain management. Follow up with your primary care doctor. You have also been provided a referral to neurology given your stated complaints. ?

## 2021-06-06 ENCOUNTER — Encounter: Payer: Self-pay | Admitting: Emergency Medicine

## 2021-06-14 ENCOUNTER — Encounter (HOSPITAL_COMMUNITY): Payer: Self-pay | Admitting: Emergency Medicine

## 2021-06-14 ENCOUNTER — Emergency Department (HOSPITAL_COMMUNITY): Payer: Medicaid Other

## 2021-06-14 ENCOUNTER — Other Ambulatory Visit: Payer: Self-pay

## 2021-06-14 ENCOUNTER — Emergency Department (HOSPITAL_COMMUNITY)
Admission: EM | Admit: 2021-06-14 | Discharge: 2021-06-15 | Disposition: A | Discharge status: Home / Self Care | Payer: Medicaid Other | Attending: Emergency Medicine | Admitting: Emergency Medicine

## 2021-06-14 DIAGNOSIS — R079 Chest pain, unspecified: Secondary | ICD-10-CM | POA: Insufficient documentation

## 2021-06-14 DIAGNOSIS — I10 Essential (primary) hypertension: Secondary | ICD-10-CM | POA: Diagnosis not present

## 2021-06-14 DIAGNOSIS — R0602 Shortness of breath: Secondary | ICD-10-CM | POA: Diagnosis not present

## 2021-06-14 DIAGNOSIS — N9489 Other specified conditions associated with female genital organs and menstrual cycle: Secondary | ICD-10-CM | POA: Insufficient documentation

## 2021-06-14 DIAGNOSIS — R202 Paresthesia of skin: Secondary | ICD-10-CM | POA: Insufficient documentation

## 2021-06-14 DIAGNOSIS — Z79899 Other long term (current) drug therapy: Secondary | ICD-10-CM | POA: Insufficient documentation

## 2021-06-14 LAB — CBC WITH DIFFERENTIAL/PLATELET
Abs Immature Granulocytes: 0.04 10*3/uL (ref 0.00–0.07)
Basophils Absolute: 0.1 10*3/uL (ref 0.0–0.1)
Basophils Relative: 1 %
Eosinophils Absolute: 0.2 10*3/uL (ref 0.0–0.5)
Eosinophils Relative: 2 %
HCT: 44.6 % (ref 36.0–46.0)
Hemoglobin: 15.4 g/dL — ABNORMAL HIGH (ref 12.0–15.0)
Immature Granulocytes: 0 %
Lymphocytes Relative: 29 %
Lymphs Abs: 2.9 10*3/uL (ref 0.7–4.0)
MCH: 33.6 pg (ref 26.0–34.0)
MCHC: 34.5 g/dL (ref 30.0–36.0)
MCV: 97.4 fL (ref 80.0–100.0)
Monocytes Absolute: 0.8 10*3/uL (ref 0.1–1.0)
Monocytes Relative: 8 %
Neutro Abs: 6.1 10*3/uL (ref 1.7–7.7)
Neutrophils Relative %: 60 %
Platelets: 327 10*3/uL (ref 150–400)
RBC: 4.58 MIL/uL (ref 3.87–5.11)
RDW: 11.9 % (ref 11.5–15.5)
WBC: 10.1 10*3/uL (ref 4.0–10.5)
nRBC: 0 % (ref 0.0–0.2)

## 2021-06-14 LAB — RAPID URINE DRUG SCREEN, HOSP PERFORMED
Amphetamines: NOT DETECTED
Barbiturates: NOT DETECTED
Benzodiazepines: NOT DETECTED
Cocaine: NOT DETECTED
Opiates: NOT DETECTED
Tetrahydrocannabinol: NOT DETECTED

## 2021-06-14 LAB — BASIC METABOLIC PANEL
Anion gap: 11 (ref 5–15)
BUN: 10 mg/dL (ref 6–20)
CO2: 24 mmol/L (ref 22–32)
Calcium: 9 mg/dL (ref 8.9–10.3)
Chloride: 101 mmol/L (ref 98–111)
Creatinine, Ser: 0.71 mg/dL (ref 0.44–1.00)
GFR, Estimated: >60 mL/min (ref >60)
Glucose, Bld: 111 mg/dL — ABNORMAL HIGH (ref 70–99)
Potassium: 3.5 mmol/L (ref 3.5–5.1)
Sodium: 136 mmol/L (ref 135–145)

## 2021-06-14 LAB — URINALYSIS, ROUTINE W REFLEX MICROSCOPIC
Bacteria, UA: NONE SEEN
Bilirubin Urine: NEGATIVE
Glucose, UA: NEGATIVE mg/dL
Hgb urine dipstick: NEGATIVE
Ketones, ur: 5 mg/dL — AB
Nitrite: NEGATIVE
Protein, ur: NEGATIVE mg/dL
Specific Gravity, Urine: 1.018 (ref 1.005–1.030)
pH: 6 (ref 5.0–8.0)

## 2021-06-14 LAB — TROPONIN I (HIGH SENSITIVITY): Troponin I (High Sensitivity): 3 ng/L (ref <18)

## 2021-06-14 LAB — ETHANOL: Alcohol, Ethyl (B): <10 mg/dL (ref <10)

## 2021-06-14 LAB — PROTIME-INR
INR: 1.1 (ref 0.8–1.2)
Prothrombin Time: 13.7 seconds (ref 11.4–15.2)

## 2021-06-14 NOTE — ED Provider Triage Note (Signed)
Emergency Medicine Provider Triage Evaluation Note ? ?Deborah Park , a 36 y.o. female  was evaluated in triage.  Pt complains of intermittent left-sided facial and upper extremity numbness that started last night and has been continuous throughout the day.  Comes and goes spontaneously.  Denies any weakness.  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  She is not currently anticoagulated.  Endorses chest pain this evening that does not radiate, does not have any associated shortness of breath, no palpitations.  Endorses generalized abdominal pain worse in the suprapubic area.. ? ?Review of Systems  ?Positive: Chest pain, numbness on the left side, abdominal pain ?Negative: Diarrhea, urinary frequency or urgency, fevers, ? ?Physical Exam  ?BP 122/78 (BP Location: Right Arm)   Pulse 87   Temp 98.4 ?F (36.9 ?C) (Oral)   Resp 14   Ht 5\' 2"  (1.575 m)   Wt 48.1 kg   SpO2 97%   BMI 19.39 kg/m?  ?Gen:   Awake, no distress   ?Resp:  Normal effort  ?MSK:   Moves extremities without difficulty  ?Other:  Cranial nerves intact.  PERRL, EOMI.  Symmetric strength and sensation in the upper and lower extremities bilaterally.  No pronator drift.  Patient ambulatory in the emergency department without difficulty. ? ?Medical Decision Making  ?Medically screening exam initiated at 9:42 PM.  Appropriate orders placed.  Mardelle A Reever was informed that the remainder of the evaluation will be completed by another provider, this initial triage assessment does not replace that evaluation, and the importance of remaining in the ED until their evaluation is complete. ? ?This chart was dictated using voice recognition software, Dragon. Despite the best efforts of this provider to proofread and correct errors, errors may still occur which can change documentation meaning. ? ?  ? , PA-C ?06/14/21 2156 ? ?

## 2021-06-14 NOTE — ED Triage Notes (Signed)
Pt arrive by EMS from home for c/o cp and abd pain, initially called EMS for intermittent tingling and numbness on  herl left side since yesterday morning. Pt is AO x 4 on triage no neuro deficit noticed, nad during triage. ?

## 2021-06-15 LAB — I-STAT BETA HCG BLOOD, ED (MC, WL, AP ONLY): I-stat hCG, quantitative: <5 m[IU]/mL (ref <5)

## 2021-06-15 LAB — TROPONIN I (HIGH SENSITIVITY): Troponin I (High Sensitivity): 3 ng/L (ref <18)

## 2021-06-15 MED ORDER — DIPHENHYDRAMINE HCL 50 MG/ML IJ SOLN
12.5000 mg | Freq: Once | INTRAMUSCULAR | Status: AC
  Administered 2021-06-15: 12.5 mg via INTRAVENOUS
  Filled 2021-06-15: qty 1

## 2021-06-15 MED ORDER — SODIUM CHLORIDE 0.9 % IV BOLUS
1000.0000 mL | Freq: Once | INTRAVENOUS | Status: AC
Start: 2021-06-15 — End: 2021-06-15
  Administered 2021-06-15: 1000 mL via INTRAVENOUS

## 2021-06-15 MED ORDER — PROCHLORPERAZINE EDISYLATE 10 MG/2ML IJ SOLN
10.0000 mg | Freq: Once | INTRAMUSCULAR | Status: AC
  Administered 2021-06-15: 10 mg via INTRAVENOUS
  Filled 2021-06-15: qty 2

## 2021-06-15 NOTE — Discharge Instructions (Signed)
You were seen in the emergency department tonight for numbness/tingling.  Your work-up was overall reassuring.  Your labs did not show significant abnormalities.  Your head CT did not show any areas of bleeding.  Please follow-up with neurology, we have sent a referral, please call the office to schedule soonest available follow-up appointment.  Return to the emergency department for any new or worsening symptoms including but not limited to new or worsening pain, persistent numbness, weakness, change in your vision, trouble with speech, seizure activity, passing out, trouble walking, or any other concerns. ?

## 2021-06-15 NOTE — ED Provider Notes (Signed)
?[facility] ?Provider Note ? ? ?CSN: [id number] ?Arrival date & time: 06/14/21  2134 ? ?  ? ?History ? ?Chief Complaint  ?Patient presents with  ? Numbness  ? ? ?Deborah Park is a 36 y.o. female with a history of hypertension, anxiety, depression, polysubstance use, and type II Chiari malformation who presents to the emergency department with multiple complaints.  ? ?Patient reports that last night about 24 hours ago when she was laying in bed she developed left-sided painful paresthesias/numbness to the face, upper and lower extremity as well as to the body completely on the left side. This lasted a couple of hours and then resolved.  This has subsequently occurred intermittently briefly throughout the day today.  No alleviating or aggravating factors.  No paresthesias/numbness at present.  States last night she also felt short of breath with mouth tightness and some chest pain when these symptoms initially came on, she states that her shortness of breath has been a chronic problem for several months, chest pain has occurred intermittently throughout the day today described as sharp without alleviating or aggravating factors.  She mentions multiple other chronic conditions that have not acutely changed today.  She denies diplopia, loss of vision, speech change, syncope, hemoptysis, or vomiting. Denies current drug use, states she has been clean for 3 years.  ? ?HPI ? ?  ? ?Home Medications ?Prior to Admission medications   ?Medication Sig Start Date End Date Taking? Authorizing Provider  ?Blood Pressure Monitoring (BLOOD PRESSURE KIT) DEVI 1 kit by Does not apply route once a week. Check Blood Pressure regularly and record readings into the [employer] App.  Large Cuff.  DX O90.0 06/05/19   Woodroe Mode, MD  ?erythromycin ophthalmic ointment Place a 1/2 inch ribbon of ointment into the lower eyelid 3 times a day 11/08/20   Hayden Rasmussen, MD  ?ketorolac (ACULAR) 0.5 %  ophthalmic solution Place 1 drop into both eyes 4 (four) times daily. 12/09/20   Menshew, Dannielle Karvonen, PA-C  ?ondansetron (ZOFRAN ODT) 4 MG disintegrating tablet Take 1 tablet (4 mg total) by mouth every 8 (eight) hours as needed for nausea or vomiting. 11/08/20   Delia Heady, PA-C  ?Prenat-FeAsp-Meth-FA-DHA w/o A (PRENATE PIXIE) 10-0.6-0.4-200 MG CAPS Take 1 tablet by mouth daily. 05/08/19   Woodroe Mode, MD  ?sodium chloride (OCEAN) 0.65 % SOLN nasal spray Place 1 spray into both nostrils as needed for congestion. 08/11/20   Carmin Muskrat, MD  ?   ? ?Allergies    ?Patient has no known allergies.   ? ?Review of Systems   ?Review of Systems  ?Constitutional:  Negative for chills and fever.  ?HENT:  Negative for congestion, sinus pressure and sinus pain.   ?Eyes:  Negative for visual disturbance.  ?Respiratory:  Positive for shortness of breath.   ?Cardiovascular:  Positive for chest pain.  ?Gastrointestinal:  Positive for abdominal pain (chronic). Negative for vomiting.  ?Neurological:  Positive for numbness. Negative for seizures, syncope and speech difficulty.  ?All other systems reviewed and are negative. ? ?Physical Exam ?Updated Vital Signs ?BP 121/88 (BP Location: Right Arm)   Pulse 80   Temp 98.4 ?F (36.9 ?C) (Oral)   Resp 14   Ht _0  (1.575 m)   Wt 48.1 kg   SpO2 99%   BMI 19.39 kg/m?  ?Physical Exam ?Vitals and nursing note reviewed.  ?Constitutional:   ?   General: She is not in acute distress. ?  Appearance: Normal appearance. She is not toxic-appearing.  ?HENT:  ?   Head: Normocephalic and atraumatic.  ?   Mouth/Throat:  ?   Pharynx: Oropharynx is clear. Uvula midline.  ?Eyes:  ?   General: Vision grossly intact. Gaze aligned appropriately.  ?   Extraocular Movements: Extraocular movements intact.  ?   Conjunctiva/sclera: Conjunctivae normal.  ?   Pupils: Pupils are equal, round, and reactive to light.  ?   Comments: No proptosis.   ?Cardiovascular:  ?   Rate and Rhythm: Normal rate and  regular rhythm.  ?   Pulses:     ?     Radial pulses are 2+ on the right side and 2+ on the left side.  ?     Dorsalis pedis pulses are 2+ on the right side and 2+ on the left side.  ?Pulmonary:  ?   Effort: Pulmonary effort is normal.  ?   Breath sounds: Normal breath sounds.  ?Abdominal:  ?   General: There is no distension.  ?   Palpations: Abdomen is soft.  ?   Tenderness: There is no abdominal tenderness. There is no guarding or rebound.  ?Musculoskeletal:  ?   Cervical back: Normal range of motion and neck supple. No rigidity.  ?   Comments: Ranging upper and lower extremities without focal tenderness, compartments are soft  ?Skin: ?   General: Skin is warm and dry.  ?Neurological:  ?   Mental Status: She is alert.  ?   Comments: Alert. Clear speech. No facial droop. CNIII-XII grossly intact. Bilateral upper and lower extremities' sensation grossly intact-patient states that the left side of her body feels "hypersensitive". 5/5 symmetric strength with grip strength, elbow/shoulder flexion/extension, hip flexion/extension and plantar and dorsi flexion bilaterally . Normal finger to nose bilaterally. Negative pronator drift.  Ambulatory.   ?  ?Psychiatric:     ?   Mood and Affect: Mood normal.     ?   Behavior: Behavior normal.  ? ? ?ED Results / Procedures / Treatments   ?Labs ?(all labs ordered are listed, but only abnormal results are displayed) ?Labs Reviewed  ?CBC WITH DIFFERENTIAL/PLATELET - Abnormal; Notable for the following components:  ?    Result Value  ? Hemoglobin 15.4 (*)   ? All other components within normal limits  ?BASIC METABOLIC PANEL - Abnormal; Notable for the following components:  ? Glucose, Bld 111 (*)   ? All other components within normal limits  ?URINALYSIS, ROUTINE W REFLEX MICROSCOPIC - Abnormal; Notable for the following components:  ? APPearance HAZY (*)   ? Ketones, ur 5 (*)   ? Leukocytes,Ua SMALL (*)   ? All other components within normal limits  ?PROTIME-INR  ?ETHANOL  ?RAPID  URINE DRUG SCREEN, HOSP PERFORMED  ?I-STAT BETA HCG BLOOD, ED ([facility], [facility], [facility] ONLY)  ?TROPONIN I (HIGH SENSITIVITY)  ?TROPONIN I (HIGH SENSITIVITY)  ? ? ?EKG ?None ? ?Radiology ?DG Chest 2 View ? ?Result Date: 06/14/2021 ?CLINICAL DATA:  Chest pain EXAM: CHEST - 2 VIEW COMPARISON:  05/22/2021 FINDINGS: The heart size and mediastinal contours are within normal limits. Both lungs are clear. The visualized skeletal structures are unremarkable. IMPRESSION: No active cardiopulmonary disease. Electronically Signed   By: Rolm Baptise M.D.   On: 06/14/2021 23:05  ? ?CT HEAD WO CONTRAST (5MM) ? ?Result Date: 06/14/2021 ?CLINICAL DATA:  TIA, left-sided facial and left upper extremity numbness EXAM: CT HEAD WITHOUT CONTRAST TECHNIQUE: Contiguous axial images were obtained from the  base of the skull through the vertex without intravenous contrast. RADIATION DOSE REDUCTION: This exam was performed according to the departmental dose-optimization program which includes automated exposure control, adjustment of the mA and/or kV according to patient size and/or use of iterative reconstruction technique. COMPARISON:  03/03/2021 FINDINGS: Brain: No evidence of acute infarction, hemorrhage, cerebral edema, mass, mass effect, or midline shift. No hydrocephalus or extra-axial fluid collection. Vascular: No hyperdense vessel. Skull: Normal. Negative for fracture or focal lesion. Sinuses/Orbits: Mucosal thickening and bubbly fluid in the left maxillary sinus. Otherwise clear. Other: The mastoid air cells are well aerated. IMPRESSION: IMPRESSION 1.  No acute intracranial process. 2. Mucosal thickening and bubbly fluid in the left maxillary sinus, which can be seen in the setting of acute sinusitis. Correlate with symptoms. Electronically Signed   By: Merilyn Baba M.D.   On: 06/14/2021 22:36   ? ?Procedures ?Procedures  ? ? ?Medications Ordered in ED ?Medications - No data to display ? ?ED Course/ Medical Decision Making/ A&P ?  ?                         ?Medical Decision Making ?Risk ?Prescription drug management. ? ? ?Patient presents to the ED with complaints of numbness/paresthesias as well as chest pain, this involves an extensive number of treatment

## 2021-06-24 ENCOUNTER — Emergency Department (HOSPITAL_COMMUNITY)
Admission: EM | Admit: 2021-06-24 | Discharge: 2021-06-25 | Discharge status: Against Medical Advice | Payer: Medicaid Other

## 2021-06-24 NOTE — ED Notes (Signed)
Called x3 with no response 

## 2021-06-25 ENCOUNTER — Ambulatory Visit (HOSPITAL_COMMUNITY)
Admission: EM | Admit: 2021-06-25 | Discharge: 2021-06-25 | Disposition: A | Discharge status: Home / Self Care | Payer: Medicaid Other | Attending: Psychiatry | Admitting: Psychiatry

## 2021-06-25 DIAGNOSIS — F4323 Adjustment disorder with mixed anxiety and depressed mood: Secondary | ICD-10-CM | POA: Insufficient documentation

## 2021-06-25 DIAGNOSIS — R413 Other amnesia: Secondary | ICD-10-CM | POA: Insufficient documentation

## 2021-06-25 NOTE — Discharge Instructions (Signed)
Take all medications as prescribed. Keep all follow-up appointments as scheduled.  Do not consume alcohol or use illegal drugs while on prescription medications. Report any adverse effects from your medications to your primary care provider promptly.  In the event of recurrent symptoms or worsening symptoms, call 911, a crisis hotline, or go to the nearest emergency department for evaluation.   

## 2021-06-25 NOTE — BH Assessment (Signed)
Patient presenting to [facility] with her father with chief complaint of somatic symptoms and AVH. Pt reports a lost of appetite, memory and time. Dad reports that pt is having AH but pt reports she talks to herself because she is never around anyone. Dad reports that pt feels like she has chips in her body and she has been going to the ED for physical symptoms. Pt denies SI, HI, AVH. ? ?Pt is routine.  ?

## 2021-06-25 NOTE — ED Provider Notes (Signed)
Behavioral Health Urgent Care Medical Screening Exam ? ?Patient Name: Deborah Park ?MRN: [id number] ?Date of Evaluation: 06/25/21 ?Chief Complaint:   ?Diagnosis:  ?Final diagnoses:  ?Memory difficulties  ?Adjustment disorder with mixed anxiety and depressed mood  ? ? ?History of Present illness: Deborah Park is a 36 y.o. female. Presents to [facility] urgent care accompanied with her father who reports concerns with memory loss, poor concentration and inability to focus.  Aviela reports " I am losing chunks of time" stated that she was recently seen and evaluated by the local emergency department and was advised to follow-up with Neurology.  ? ?Perrin denied suicidal or homicidal ideations.  Denies auditory visual hallucinations.  Does report history of " ear pain and white noises. "  Father reports patient was evaluated by ENT with a diagnosis of ear congestion related to allergies.  Stated patient was treated with antihistamine and appeared to be improving at that time. ? ?Father reports concern the patient running out of gas on 2 different occasions and States she is "stealing from me and I am unable to watch her." Stated that she was able to stay at a clinic and they she got better.  ? ?Reports a history of substance abuse.  Denied that she is followed by therapy or psychiatry currently.  Discussed following up [facility] urgent care walk-in clinic and keeping all outpatient follow-up appointments for neurology.  ? ?During evaluation Deborah Park is sitting in no acute distress. She is alert/oriented x 4; calm/cooperative; and mood congruent with affect.  She is speaking in a clear tone at moderate volume, and normal pace; with good eye contact. Her thought process is coherent and relevant; There is no indication that she is currently responding to internal/external stimuli or experiencing delusional thought content; and she has denied suicidal/self-harm/homicidal ideation, psychosis,  and paranoia.   ?Patient has remained calm throughout assessment and has answered questions appropriately.   ? ? ?At this time Deborah Park is educated and verbalizes understanding of mental health resources and other crisis services in the community.She is instructed to call 911 and present to the nearest emergency room should she experience any suicidal/homicidal ideation, auditory/visual/hallucinations, or detrimental worsening of her mental health condition. She was a also advised by Clinical research associate that she could call the toll-free phone on insurance card to assist with identifying in network counselors and agencies or number on back of Medicaid card to speak with care coordinator ?  ?Psychiatric Specialty Exam ? ?Presentation  ?General Appearance:Appropriate for Environment ? ?Eye Contact:Good ? ?Speech:Clear and Coherent ? ?Speech Volume:Normal ? ?Handedness:Right ? ? ?Mood and Affect  ?Mood:Anxious; Depressed ? ?Affect:Congruent ? ? ?Thought Process  ?Thought Processes:Coherent ? ?Descriptions of Associations:Intact ? ?Orientation:Full (Time, Place and Person) ? ?Thought Content:Logical ? Diagnosis of Schizophrenia or Schizoaffective disorder in past: No ?  Hallucinations:None ? ?Ideas of Reference:None ? ?Suicidal Thoughts:No ? ?Homicidal Thoughts:No ? ? ?Sensorium  ?Memory:Immediate Good; Recent Good; Remote Good ? ?Judgment:Fair ? ?Insight:Fair ? ? ?Executive Functions  ?Concentration:Fair ? ?Attention Span:No data recorded ?Recall:Good ? ?Fund of Knowledge:Good ? ?Language:Good ? ? ?Psychomotor Activity  ?Psychomotor Activity:Normal ? ? ?Assets  ?Assets:Social Support; Desire for Improvement ? ? ?Sleep  ?Sleep:Fair ? ?Number of hours: No data recorded ? ?Nutritional Assessment (For OBS and [facility] admissions only) ?Has the patient had a weight loss or gain of 10 pounds or more in the last 3 months?: No ?Has the patient had a decrease in food intake/or appetite?: No ?  Does the patient have dental problems?:  No ?Does the patient have eating habits or behaviors that may be indicators of an eating disorder including binging or inducing vomiting?: No ?Has the patient recently lost weight without trying?: 0 ?Has the patient been eating poorly because of a decreased appetite?: 0 ?Malnutrition Screening Tool Score: 0 ? ? ? ?Physical Exam: ?Physical Exam ?Vitals and nursing note reviewed.  ?Cardiovascular:  ?   Rate and Rhythm: Normal rate and regular rhythm.  ?Neurological:  ?   Mental Status: She is alert.  ?Psychiatric:     ?   Mood and Affect: Mood normal.     ?   Thought Content: Thought content normal.  ? ?Review of Systems  ?HENT: Negative.    ?Eyes: Negative.   ?Cardiovascular: Negative.   ?Psychiatric/Behavioral:  Positive for substance abuse. The patient is nervous/anxious.   ?All other systems reviewed and are negative. ?Blood pressure 108/79, pulse (!) 104, temperature 98.1 ?F (36.7 ?C), temperature source Oral, resp. rate 18, SpO2 99 %, unknown if currently breastfeeding. There is no height or weight on file to calculate BMI. ? ?Musculoskeletal: ?Strength & Muscle Tone: within normal limits ?Gait & Station: normal ?Patient leans: N/A ? ? ?[facility] MSE Discharge Disposition for Follow up and Recommendations: ?Based on my evaluation the patient does not appear to have an emergency medical condition and can be discharged with resources and follow up care in outpatient services for Partial Hospitalization Program, Substance Abuse Intensive Outpatient Program, Individual Therapy, and Neurology  ? ? ?Oneta Rack, NP ?06/25/2021, 5:25 PM ? ?

## 2021-08-02 ENCOUNTER — Emergency Department (HOSPITAL_COMMUNITY)
Admission: EM | Admit: 2021-08-02 | Discharge: 2021-08-02 | Discharge status: Against Medical Advice | Payer: Self-pay | Attending: Emergency Medicine | Admitting: Emergency Medicine

## 2021-08-02 ENCOUNTER — Other Ambulatory Visit: Payer: Self-pay

## 2021-08-02 NOTE — ED Notes (Signed)
Pt gave an alias and stormed out of triage after being asked about it by registration.  ?

## 2021-08-17 ENCOUNTER — Emergency Department (HOSPITAL_COMMUNITY)
Admission: EM | Admit: 2021-08-17 | Discharge: 2021-08-17 | Disposition: A | Discharge status: Home / Self Care | Payer: Medicaid Other | Attending: Emergency Medicine | Admitting: Emergency Medicine

## 2021-08-17 ENCOUNTER — Other Ambulatory Visit: Payer: Self-pay

## 2021-08-17 ENCOUNTER — Emergency Department (HOSPITAL_COMMUNITY): Payer: Medicaid Other

## 2021-08-17 ENCOUNTER — Encounter (HOSPITAL_COMMUNITY): Payer: Self-pay

## 2021-08-17 DIAGNOSIS — I10 Essential (primary) hypertension: Secondary | ICD-10-CM | POA: Diagnosis not present

## 2021-08-17 DIAGNOSIS — F17200 Nicotine dependence, unspecified, uncomplicated: Secondary | ICD-10-CM | POA: Diagnosis not present

## 2021-08-17 DIAGNOSIS — R0789 Other chest pain: Secondary | ICD-10-CM | POA: Diagnosis not present

## 2021-08-17 LAB — BASIC METABOLIC PANEL
Anion gap: 9 (ref 5–15)
BUN: 18 mg/dL (ref 6–20)
CO2: 26 mmol/L (ref 22–32)
Calcium: 9.5 mg/dL (ref 8.9–10.3)
Chloride: 104 mmol/L (ref 98–111)
Creatinine, Ser: 0.68 mg/dL (ref 0.44–1.00)
GFR, Estimated: >60 mL/min (ref >60)
Glucose, Bld: 110 mg/dL — ABNORMAL HIGH (ref 70–99)
Potassium: 3.7 mmol/L (ref 3.5–5.1)
Sodium: 139 mmol/L (ref 135–145)

## 2021-08-17 LAB — CBC
HCT: 47.5 % — ABNORMAL HIGH (ref 36.0–46.0)
Hemoglobin: 16.5 g/dL — ABNORMAL HIGH (ref 12.0–15.0)
MCH: 34.4 pg — ABNORMAL HIGH (ref 26.0–34.0)
MCHC: 34.7 g/dL (ref 30.0–36.0)
MCV: 99.2 fL (ref 80.0–100.0)
Platelets: 279 10*3/uL (ref 150–400)
RBC: 4.79 MIL/uL (ref 3.87–5.11)
RDW: 12.3 % (ref 11.5–15.5)
WBC: 13.8 10*3/uL — ABNORMAL HIGH (ref 4.0–10.5)
nRBC: 0 % (ref 0.0–0.2)

## 2021-08-17 LAB — D-DIMER, QUANTITATIVE: D-Dimer, Quant: <0.27 ug/mL-FEU (ref 0.00–0.50)

## 2021-08-17 LAB — HCG, QUANTITATIVE, PREGNANCY: hCG, Beta Chain, Quant, S: <1 m[IU]/mL (ref <5)

## 2021-08-17 LAB — TROPONIN I (HIGH SENSITIVITY): Troponin I (High Sensitivity): 3 ng/L (ref <18)

## 2021-08-17 NOTE — ED Triage Notes (Signed)
Pt reports with chest pain that radiates into her jaw. Pt states that this has happened for weeks.  ?

## 2021-08-17 NOTE — Discharge Instructions (Signed)
You were seen today for chest pain.  Your work-up is reassuring.  Follow-up with your primary physician.  Trial naproxen for any ongoing pain. ?

## 2021-08-17 NOTE — ED Provider Notes (Signed)
?[facility] ?Provider Note ? ? ?CSN: [id number] ?Arrival date & time: 08/17/21  0059 ? ?  ? ?History ? ?Chief Complaint  ?Patient presents with  ? Chest Pain  ? ? ?Deborah Park is a 36 y.o. female. ? ?HPI ? ?  ? ?This is a 36 year old female who presents with chest pain.  Patient reports several week history of waxing and waning chest discomfort.  She states that it can be on either side of her chest and she describes it as electric shocks.  It will radiate into her jaw.  She states that normally it just comes and goes.  It is not specifically provoked by anything but she states that she often will feel it in her car.  Today she had episodes that lasted longer on the order of 20 minutes and this concerned her.  She does report some nausea.  No vomiting or diarrhea.  No other GI symptoms.  No history of GERD.  She denies shortness of breath or cough.  No known infectious symptoms.  She is a current everyday smoker. ? ?Home Medications ?Prior to Admission medications   ?Medication Sig Start Date End Date Taking? Authorizing Provider  ?Blood Pressure Monitoring (BLOOD PRESSURE KIT) DEVI 1 kit by Does not apply route once a week. Check Blood Pressure regularly and record readings into the [employer] App.  Large Cuff.  DX O90.0 06/05/19   Woodroe Mode, MD  ?erythromycin ophthalmic ointment Place a 1/2 inch ribbon of ointment into the lower eyelid 3 times a day 11/08/20   Hayden Rasmussen, MD  ?ketorolac (ACULAR) 0.5 % ophthalmic solution Place 1 drop into both eyes 4 (four) times daily. 12/09/20   Menshew, Dannielle Karvonen, PA-C  ?ondansetron (ZOFRAN ODT) 4 MG disintegrating tablet Take 1 tablet (4 mg total) by mouth every 8 (eight) hours as needed for nausea or vomiting. 11/08/20   Delia Heady, PA-C  ?Prenat-FeAsp-Meth-FA-DHA w/o A (PRENATE PIXIE) 10-0.6-0.4-200 MG CAPS Take 1 tablet by mouth daily. 05/08/19   Woodroe Mode, MD  ?sodium chloride (OCEAN) 0.65 % SOLN nasal spray Place 1  spray into both nostrils as needed for congestion. 08/11/20   Carmin Muskrat, MD  ?   ? ?Allergies    ?Patient has no known allergies.   ? ?Review of Systems   ?Review of Systems  ?Cardiovascular:  Positive for chest pain.  ?All other systems reviewed and are negative. ? ?Physical Exam ?Updated Vital Signs ?BP 109/73   Pulse 98   Temp 98.2 ?F (36.8 ?C) (Oral)   Resp 18   LMP 07/21/2021   SpO2 98%  ?Physical Exam ?Vitals and nursing note reviewed.  ?Constitutional:   ?   Appearance: She is well-developed. She is not ill-appearing.  ?HENT:  ?   Head: Normocephalic and atraumatic.  ?Eyes:  ?   Pupils: Pupils are equal, round, and reactive to light.  ?Cardiovascular:  ?   Rate and Rhythm: Normal rate and regular rhythm.  ?   Heart sounds: Normal heart sounds.  ?Pulmonary:  ?   Effort: Pulmonary effort is normal. No respiratory distress.  ?   Breath sounds: No wheezing.  ?Abdominal:  ?   General: Bowel sounds are normal.  ?   Palpations: Abdomen is soft.  ?Musculoskeletal:  ?   Cervical back: Neck supple.  ?   Right lower leg: No tenderness. No edema.  ?   Left lower leg: No tenderness. No edema.  ?Skin: ?  General: Skin is warm and dry.  ?Neurological:  ?   Mental Status: She is alert and oriented to person, place, and time.  ?Psychiatric:     ?   Mood and Affect: Mood is not anxious.  ? ? ?ED Results / Procedures / Treatments   ?Labs ?(all labs ordered are listed, but only abnormal results are displayed) ?Labs Reviewed  ?BASIC METABOLIC PANEL - Abnormal; Notable for the following components:  ?    Result Value  ? Glucose, Bld 110 (*)   ? All other components within normal limits  ?CBC - Abnormal; Notable for the following components:  ? WBC 13.8 (*)   ? Hemoglobin 16.5 (*)   ? HCT 47.5 (*)   ? MCH 34.4 (*)   ? All other components within normal limits  ?HCG, QUANTITATIVE, PREGNANCY  ?D-DIMER, QUANTITATIVE  ?TROPONIN I (HIGH SENSITIVITY)  ?TROPONIN I (HIGH SENSITIVITY)  ? ? ?EKG ?EKG  Interpretation ? ?Date/Time:  Wednesday Aug 17 2021 01:10:28 EDT ?Ventricular Rate:  80 ?PR Interval:  194 ?QRS Duration: 98 ?QT Interval:  382 ?QTC Calculation: 441 ?R Axis:   91 ?Text Interpretation: Sinus rhythm Biatrial enlargement Borderline right axis deviation Confirmed by Thayer Jew [phone]) on 08/17/2021 1:40:40 AM ? ?Radiology ?DG Chest 2 View ? ?Result Date: 08/17/2021 ?CLINICAL DATA:  Chest pain. EXAM: CHEST - 2 VIEW COMPARISON:  Chest radiograph dated 06/14/2021. FINDINGS: The heart size and mediastinal contours are within normal limits. Both lungs are clear. The visualized skeletal structures are unremarkable. IMPRESSION: No active cardiopulmonary disease. Electronically Signed   By: Anner Crete M.D.   On: 08/17/2021 01:31   ? ?Procedures ?Procedures  ? ? ?Medications Ordered in ED ?Medications - No data to display ? ?ED Course/ Medical Decision Making/ A&P ?  ?                        ?Medical Decision Making ?Amount and/or Complexity of Data Reviewed ?Labs: ordered. ?Radiology: ordered. ? ? ?This patient presents to the ED for concern of chest pain, this involves an extensive number of treatment options, and is a complaint that carries with it a high risk of complications and morbidity.  I considered the following differential and admission for this acute, potentially life threatening condition.  The differential diagnosis includes ACS, PE, pneumothorax, pneumonia, costochondritis ? ?MDM:   ? ?This is a 36 year old female who presents with chest pain.  She is nontoxic and vital signs are reassuring.  Pain is quite atypical.  It is waxing and waning without significant eliciting factors.  Quite atypical for ACS.  EKG without ischemic changes.  Troponin x1 negative.  Feel this is enough to rule out given ongoing nature of symptoms.  Patient was screened for PE with a D-dimer and this is negative.  She is otherwise low risk.  This effectively rules out PE.  Chest x-ray shows no evidence of  pneumothorax or pneumonia.  Basic lab work reassuring.  Patient has remained hemodynamically stable.  Recommend trial of anti-inflammatories and follow-up with primary physician. ? ?(Labs, imaging, consults) ? ?Labs: ?I Ordered, and personally interpreted labs.  The pertinent results include: CBC, BMP, troponin, D-dimer ? ?Imaging Studies ordered: ?I ordered imaging studies including x-ray ?I independently visualized and interpreted imaging. ?I agree with the radiologist interpretation ? ?Additional history obtained from chart review.  External records from outside source obtained and reviewed including prior evaluations ? ?Cardiac Monitoring: ?The patient was maintained on a cardiac  monitor.  I personally viewed and interpreted the cardiac monitored which showed an underlying rhythm of: Normal sinus rhythm ? ?Reevaluation: ?After the interventions noted above, I reevaluated the patient and found that they have :stayed the same ? ?Social Determinants of Health: ?Smoker ? ?Disposition: Discharge ? ?Co morbidities that complicate the patient evaluation ? ?Past Medical History:  ?Diagnosis Date  ? Anxiety   ? 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 Hypertension   ? Memory loss 09/17/2013  ? Mental disorder   ? Sleep disturbance 09/17/2013  ?  ? ?Medicines ?No orders of the defined types were placed in this encounter. ?  ?I have reviewed the patients home medicines and have made adjustments as needed ? ?Problem List / ED Course: ?Problem List Items Addressed This Visit   ?None ?Visit Diagnoses   ? ? Atypical chest pain    -  Primary  ? ?  ?  ? ? ? ? ? ? ? ? ? ? ? ? ?Final Clinical Impression(s) / ED Diagnoses ?Final diagnoses:  ?Atypical chest pain  ? ? ?Rx / DC Orders ?ED Discharge Orders   ? ? None  ? ?  ? ? ?  ?Merryl Hacker, MD ?08/17/21 0236 ? ?

## 2021-08-17 NOTE — ED Notes (Signed)
I provided reinforced discharge education based off of discharge instructions. Pt acknowledged and understood my education. Pt had no further questions/concerns for provider/myself.  °

## 2021-08-22 ENCOUNTER — Encounter (HOSPITAL_COMMUNITY): Payer: Self-pay

## 2021-08-22 ENCOUNTER — Emergency Department (HOSPITAL_COMMUNITY)
Admission: EM | Admit: 2021-08-22 | Discharge: 2021-08-22 | Disposition: A | Discharge status: Home / Self Care | Payer: Medicaid Other | Attending: Emergency Medicine | Admitting: Emergency Medicine

## 2021-08-22 DIAGNOSIS — F419 Anxiety disorder, unspecified: Secondary | ICD-10-CM | POA: Insufficient documentation

## 2021-08-22 DIAGNOSIS — R251 Tremor, unspecified: Secondary | ICD-10-CM | POA: Diagnosis not present

## 2021-08-22 DIAGNOSIS — R072 Precordial pain: Secondary | ICD-10-CM | POA: Insufficient documentation

## 2021-08-22 DIAGNOSIS — R079 Chest pain, unspecified: Secondary | ICD-10-CM | POA: Diagnosis present

## 2021-08-22 DIAGNOSIS — I10 Essential (primary) hypertension: Secondary | ICD-10-CM | POA: Diagnosis not present

## 2021-08-22 LAB — CBC WITH DIFFERENTIAL/PLATELET
Abs Immature Granulocytes: 0.04 10*3/uL (ref 0.00–0.07)
Basophils Absolute: 0.1 10*3/uL (ref 0.0–0.1)
Basophils Relative: 1 %
Eosinophils Absolute: 0 10*3/uL (ref 0.0–0.5)
Eosinophils Relative: 0 %
HCT: 45.9 % (ref 36.0–46.0)
Hemoglobin: 15.7 g/dL — ABNORMAL HIGH (ref 12.0–15.0)
Immature Granulocytes: 0 %
Lymphocytes Relative: 21 %
Lymphs Abs: 1.9 10*3/uL (ref 0.7–4.0)
MCH: 34.1 pg — ABNORMAL HIGH (ref 26.0–34.0)
MCHC: 34.2 g/dL (ref 30.0–36.0)
MCV: 99.8 fL (ref 80.0–100.0)
Monocytes Absolute: 0.5 10*3/uL (ref 0.1–1.0)
Monocytes Relative: 5 %
Neutro Abs: 6.5 10*3/uL (ref 1.7–7.7)
Neutrophils Relative %: 73 %
Platelets: 288 10*3/uL (ref 150–400)
RBC: 4.6 MIL/uL (ref 3.87–5.11)
RDW: 12.3 % (ref 11.5–15.5)
WBC: 9.1 10*3/uL (ref 4.0–10.5)
nRBC: 0 % (ref 0.0–0.2)

## 2021-08-22 LAB — I-STAT BETA HCG BLOOD, ED (MC, WL, AP ONLY): I-stat hCG, quantitative: <5 m[IU]/mL (ref <5)

## 2021-08-22 LAB — BASIC METABOLIC PANEL
Anion gap: 7 (ref 5–15)
BUN: 11 mg/dL (ref 6–20)
CO2: 25 mmol/L (ref 22–32)
Calcium: 9 mg/dL (ref 8.9–10.3)
Chloride: 105 mmol/L (ref 98–111)
Creatinine, Ser: 0.72 mg/dL (ref 0.44–1.00)
GFR, Estimated: >60 mL/min (ref >60)
Glucose, Bld: 98 mg/dL (ref 70–99)
Potassium: 3.7 mmol/L (ref 3.5–5.1)
Sodium: 137 mmol/L (ref 135–145)

## 2021-08-22 LAB — TROPONIN I (HIGH SENSITIVITY)
Troponin I (High Sensitivity): <2 ng/L (ref <18)
Troponin I (High Sensitivity): <2 ng/L (ref <18)

## 2021-08-22 LAB — D-DIMER, QUANTITATIVE: D-Dimer, Quant: 0.3 ug/mL-FEU (ref 0.00–0.50)

## 2021-08-22 NOTE — ED Provider Notes (Signed)
Deborah Park   CSN: [id number] Arrival date & time: 08/22/21  9791     History  No chief complaint on file.   Deborah Park is a 36 y.o. female with medical history of anxiety, memory loss, sleep disturbance, hypertension, psychoactive substance induced psychosis, polysubstance use, chart malformation type II.  Presents to the emergency department today to be evaluated for an episode of tremors and chest pain.  Patient states that this morning at 730 while she was sitting watching TV she began having tremors to her entire body.  Patient described tremors as "convulsions," and stated it was "like I was having a seizure."  Patient has full recollection of all events and did not lose consciousness at any point this morning.  Patient states that these symptoms lasted for approximately 20 minutes.  Patient states that they continued while she drove to the emergency department and stopped as she was entering the emergency department.  Patient also reports of chest pain.  States that she had episode of chest pain upon being roomed in the emergency department.  Pain was located to the left side of her chest and felt like "sharp needles."  This lasted for a few seconds and then resolved.  Patient reports that she has had 10 similar episodes of the last 2 weeks.  Denies any associated shortness of breath, palpitations, nausea, vomiting, diaphoresis, lightheadedness, syncope.  Patient denies any recent falls or traumatic injuries, illicit drug use, alcohol use.  Denies any fever, chills, numbness, weakness, facial asymmetry, dysarthria, visual disturbance, headache, seizure, fever, chills, abdominal pain, nausea, vomiting,.  HPI     Home Medications Prior to Admission medications   Medication Sig Start Date End Date Taking? Authorizing Provider  Blood Pressure Monitoring (BLOOD PRESSURE KIT) DEVI 1 kit by Does not apply route once a week. Check  Blood Pressure regularly and record readings into the [employer] App.  Large Cuff.  DX O90.0 06/05/19   Woodroe Mode, MD  erythromycin ophthalmic ointment Place a 1/2 inch ribbon of ointment into the lower eyelid 3 times a day 11/08/20   Hayden Rasmussen, MD  ketorolac (ACULAR) 0.5 % ophthalmic solution Place 1 drop into both eyes 4 (four) times daily. 12/09/20   Menshew, Dannielle Karvonen, PA-C  ondansetron (ZOFRAN ODT) 4 MG disintegrating tablet Take 1 tablet (4 mg total) by mouth every 8 (eight) hours as needed for nausea or vomiting. 11/08/20   Khatri, Hina, PA-C  Prenat-FeAsp-Meth-FA-DHA w/o A (PRENATE PIXIE) 10-0.6-0.4-200 MG CAPS Take 1 tablet by mouth daily. 05/08/19   Woodroe Mode, MD  sodium chloride (OCEAN) 0.65 % SOLN nasal spray Place 1 spray into both nostrils as needed for congestion. 08/11/20   Carmin Muskrat, MD      Allergies    Patient has no known allergies.    Review of Systems   Review of Systems  Constitutional:  Negative for chills and fever.  Eyes:  Negative for visual disturbance.  Respiratory:  Negative for shortness of breath.   Cardiovascular:  Positive for chest pain.  Gastrointestinal:  Negative for abdominal pain, nausea and vomiting.  Genitourinary:  Negative for difficulty urinating and dysuria.  Musculoskeletal:  Negative for back pain and neck pain.  Skin:  Negative for color change and rash.  Neurological:  Positive for tremors. Negative for dizziness, seizures, syncope, facial asymmetry, speech difficulty, weakness, light-headedness, numbness and headaches.  Psychiatric/Behavioral:  Negative for confusion.    Physical Exam Updated Vital  Signs BP (!) 130/96 (BP Location: Left Arm)   Pulse (!) 101   Temp 98.5 F (36.9 C) (Oral)   Resp 18   SpO2 97%  Physical Exam Vitals and nursing Park reviewed.  Constitutional:      General: She is not in acute distress.    Appearance: She is not ill-appearing, toxic-appearing or diaphoretic.  Eyes:     General:         Right eye: No discharge.        Left eye: No discharge.     Extraocular Movements: Extraocular movements intact.     Conjunctiva/sclera: Conjunctivae normal.     Pupils: Pupils are equal, round, and reactive to light.  Cardiovascular:     Rate and Rhythm: Normal rate.     Pulses:          Radial pulses are 2+ on the right side and 2+ on the left side.  Pulmonary:     Effort: Pulmonary effort is normal. No tachypnea, bradypnea or respiratory distress.     Breath sounds: Normal breath sounds. No stridor.  Abdominal:     General: Abdomen is flat.     Palpations: Abdomen is soft. There is no mass or pulsatile mass.     Tenderness: There is no abdominal tenderness. There is no guarding or rebound.     Hernia: There is no hernia in the umbilical area or ventral area.  Musculoskeletal:     Cervical back: Normal range of motion and neck supple. No rigidity.  Skin:    General: Skin is warm and dry.  Neurological:     General: No focal deficit present.     Mental Status: She is alert.     GCS: GCS eye subscore is 4. GCS verbal subscore is 5. GCS motor subscore is 6.     Cranial Nerves: No cranial nerve deficit or facial asymmetry.     Sensory: Sensation is intact.     Motor: Tremor present. No weakness, seizure activity or pronator drift.     Coordination: Romberg sign negative. Finger-Nose-Finger Test normal.     Gait: Gait is intact. Gait normal.     Comments: CN II-XII intact, equal grip strength, +5 strength to bilateral upper and lower extremities   Patient has slight tremor to bilateral upper extremities   Psychiatric:        Mood and Affect: Mood is anxious.        Behavior: Behavior is cooperative.    ED Results / Procedures / Treatments   Labs (all labs ordered are listed, but only abnormal results are displayed) Labs Reviewed  CBC WITH DIFFERENTIAL/PLATELET - Abnormal; Notable for the following components:      Result Value   Hemoglobin 15.7 (*)    MCH 34.1 (*)     All other components within normal limits  BASIC METABOLIC PANEL  D-DIMER, QUANTITATIVE  RAPID URINE DRUG SCREEN, HOSP PERFORMED  I-STAT BETA HCG BLOOD, ED ([facility], [facility], [facility] ONLY)  TROPONIN I (HIGH SENSITIVITY)  TROPONIN I (HIGH SENSITIVITY)    EKG EKG Interpretation  Date/Time:  Monday Aug 22 2021 09:37:59 EDT Ventricular Rate:  64 PR Interval:  212 QRS Duration: 96 QT Interval:  409 QTC Calculation: 422 R Axis:   90 Text Interpretation: Sinus rhythm Prolonged PR interval Right atrial enlargement Probable anterior infarct, old No significant change since last tracing Confirmed by Aletta Edouard [phone]) on 08/22/2021 9:42:40 AM  Radiology No results found.  Procedures Procedures  Medications Ordered in ED Medications - No data to display  ED Course/ Medical Decision Making/ A&P                           Medical Decision Making Amount and/or Complexity of Data Reviewed Labs: ordered.   Alert 36 year old female in no acute distress, nontoxic-appearing.  Presents emergency department with a chief complaint of tremors and chest pain.  Information obtained from patient.  Past medical records were reviewed including previous ER notes, labs, and imaging.  Per chart review patient presented to the emergency department on 06/14/2021 with  reports of paresthesia.  Patient had noncontrast head CT which showed no acute intracranial abnormality.  Patient had no focal neurological deficits on that visit.  Ambulatory referral to neurology was placed.  Patient states that she has an appointment scheduled for neurology however it is still 1 to 2 months away.  Neuro exam is reassuring.  Patient noted to have slight tremor to bilateral upper extremities.  Patient appears extremely anxious.  I have a low suspicion for seizure as patient has full recollection of all events this morning.  I doubt the severity of patient's tremors as she was able to drive to the emergency department and ambulate  inside without incident.  No focal neurological deficits at this time.  Patient will need follow-up with neurology in the outpatient setting.  Patient's chest pain does not sound consistent with ACS or PE however will obtain troponin and D-dimer for further evaluation.  I personally viewed and interpreted patient's EKG.  Tracing shows sinus rhythm.  I personally viewed interpret patient's lab results.  Pertinent findings include: -D-dimer within normal limits; low suspicion for PE at this time -Troponin less than 2 and less than 2; blood suspicion for ACS at this time -BMP and CBC unremarkable -Beta-hCG less than 5  Patient reports that she has no episodes of significant tremors while in the emergency department.  No further episodes of chest pain.  Will discharge patient to follow-up with neurology, ambulatory referral has already been placed due to previous visit.  Patient is also asking for information about psychiatry.  Will give resources at this time.  She does not express any SI, HI, or AVH at this time.  Based on patient's chief complaint, I considered admission might be necessary, however after reassuring ED workup feel patient is reasonable for discharge.  Discussed results, findings, treatment and follow up. Patient advised of return precautions. Patient verbalized understanding and agreed with plan.  Portions of this Park were generated with [employer]. Dictation errors may occur despite best attempts at proofreading.         Final Clinical Impression(s) / ED Diagnoses Final diagnoses:  Precordial chest pain  Tremors of nervous system    Rx / DC Orders ED Discharge Orders     None         Dyann Ruddle 08/22/21 1552    Hayden Rasmussen, MD 08/22/21 [phone]

## 2021-08-22 NOTE — ED Triage Notes (Signed)
Pt arrived via POV, states she is feeling "shocks" throughout entire left side of body. States this has been ongoing for several days, and has started causing convulsions this morning.

## 2021-08-22 NOTE — Discharge Instructions (Addendum)
You came to the emergency department today to be evaluated for your tremors and your chest pain.  Your physical exam was reassuring.  Please follow-up with the neurologist in the outpatient setting for further evaluation of your shaking.  Your EKG, chest x-ray, and lab work were reassuring that you are not having acute heart attack today.  Please follow-up with your primary care doctor for repeat evaluation.  Get help right away if: Your chest pain gets worse. You have a cough that gets worse, or you cough up blood. You have severe pain in your abdomen. You faint. You have sudden, unexplained chest discomfort. You have sudden, unexplained discomfort in your arms, back, neck, or jaw. You have shortness of breath at any time. You suddenly start to sweat, or your skin gets clammy. You feel nausea or you vomit. You suddenly feel lightheaded or dizzy. You have severe weakness, or unexplained weakness or fatigue. Your heart begins to beat quickly, or it feels like it is skipping beats.

## 2021-08-22 NOTE — ED Notes (Signed)
Informed pt we needed urine also informed nurse pt stated she will let us know she didn't have to go at this time

## 2021-08-24 ENCOUNTER — Encounter (HOSPITAL_COMMUNITY): Payer: Self-pay

## 2021-08-24 ENCOUNTER — Emergency Department (HOSPITAL_COMMUNITY)
Admission: EM | Admit: 2021-08-24 | Discharge: 2021-08-24 | Disposition: A | Discharge status: Home / Self Care | Payer: Medicaid Other | Attending: Emergency Medicine | Admitting: Emergency Medicine

## 2021-08-24 ENCOUNTER — Other Ambulatory Visit: Payer: Self-pay

## 2021-08-24 DIAGNOSIS — I10 Essential (primary) hypertension: Secondary | ICD-10-CM | POA: Insufficient documentation

## 2021-08-24 DIAGNOSIS — R519 Headache, unspecified: Secondary | ICD-10-CM | POA: Diagnosis not present

## 2021-08-24 DIAGNOSIS — R202 Paresthesia of skin: Secondary | ICD-10-CM | POA: Diagnosis present

## 2021-08-24 DIAGNOSIS — R079 Chest pain, unspecified: Secondary | ICD-10-CM | POA: Diagnosis not present

## 2021-08-24 LAB — I-STAT CHEM 8, ED
BUN: 16 mg/dL (ref 6–20)
Calcium, Ion: 1.2 mmol/L (ref 1.15–1.40)
Chloride: 101 mmol/L (ref 98–111)
Creatinine, Ser: 0.6 mg/dL (ref 0.44–1.00)
Glucose, Bld: 91 mg/dL (ref 70–99)
HCT: 47 % — ABNORMAL HIGH (ref 36.0–46.0)
Hemoglobin: 16 g/dL — ABNORMAL HIGH (ref 12.0–15.0)
Potassium: 3.7 mmol/L (ref 3.5–5.1)
Sodium: 138 mmol/L (ref 135–145)
TCO2: 24 mmol/L (ref 22–32)

## 2021-08-24 NOTE — Discharge Instructions (Signed)
The test today in the ED were reassuring.  Follow-up with your primary care doctor for further evaluation of the symptoms you have been having
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
## 2021-09-03 ENCOUNTER — Encounter (HOSPITAL_COMMUNITY): Payer: Self-pay

## 2021-09-03 ENCOUNTER — Emergency Department (HOSPITAL_COMMUNITY)
Admission: EM | Admit: 2021-09-03 | Discharge: 2021-09-03 | Disposition: A | Discharge status: Home / Self Care | Payer: Medicaid Other | Attending: Emergency Medicine | Admitting: Emergency Medicine

## 2021-09-03 ENCOUNTER — Emergency Department (HOSPITAL_COMMUNITY): Payer: Medicaid Other

## 2021-09-03 DIAGNOSIS — R079 Chest pain, unspecified: Secondary | ICD-10-CM | POA: Insufficient documentation

## 2021-09-03 DIAGNOSIS — Z5321 Procedure and treatment not carried out due to patient leaving prior to being seen by health care provider: Secondary | ICD-10-CM | POA: Insufficient documentation

## 2021-09-03 LAB — CBC
HCT: 47.2 % — ABNORMAL HIGH (ref 36.0–46.0)
Hemoglobin: 15.9 g/dL — ABNORMAL HIGH (ref 12.0–15.0)
MCH: 33.1 pg (ref 26.0–34.0)
MCHC: 33.7 g/dL (ref 30.0–36.0)
MCV: 98.1 fL (ref 80.0–100.0)
Platelets: 314 10*3/uL (ref 150–400)
RBC: 4.81 MIL/uL (ref 3.87–5.11)
RDW: 12.1 % (ref 11.5–15.5)
WBC: 9.1 10*3/uL (ref 4.0–10.5)
nRBC: 0 % (ref 0.0–0.2)

## 2021-09-03 LAB — I-STAT BETA HCG BLOOD, ED (MC, WL, AP ONLY): I-stat hCG, quantitative: <5 m[IU]/mL (ref <5)

## 2021-09-03 LAB — BASIC METABOLIC PANEL
Anion gap: 8 (ref 5–15)
BUN: 16 mg/dL (ref 6–20)
CO2: 22 mmol/L (ref 22–32)
Calcium: 10 mg/dL (ref 8.9–10.3)
Chloride: 109 mmol/L (ref 98–111)
Creatinine, Ser: 0.62 mg/dL (ref 0.44–1.00)
GFR, Estimated: >60 mL/min (ref >60)
Glucose, Bld: 95 mg/dL (ref 70–99)
Potassium: 3.9 mmol/L (ref 3.5–5.1)
Sodium: 139 mmol/L (ref 135–145)

## 2021-09-03 LAB — TROPONIN I (HIGH SENSITIVITY): Troponin I (High Sensitivity): <2 ng/L (ref <18)

## 2021-09-03 NOTE — ED Provider Triage Note (Signed)
Emergency Medicine Provider Triage Evaluation Note  Deborah Park , a 36 y.o. female  was evaluated in triage.  Pt complains of chest pain.  She states that same has been ongoing for the past 2 months and is only present when she drives.  She states she feels "like someone is pulling my heart from my chest."  She endorses some associated shortness of breath and significant anxiety related to this.  She says that she has had this occur before and they were unable to tell her what happened.  Review of Systems  Positive:  Negative: See above  Physical Exam  BP 124/87 (BP Location: Left Arm)   Pulse (!) 115   Temp 98 F (36.7 C) (Oral)   Resp (!) 25   SpO2 96%  Gen:   Awake, no distress   Resp:  Normal effort  MSK:   Moves extremities without difficulty  Other:    Medical Decision Making  Medically screening exam initiated at 3:23 PM.  Appropriate orders placed.  Deborah Park was informed that the remainder of the evaluation will be completed by another provider, this initial triage assessment does not replace that evaluation, and the importance of remaining in the ED until their evaluation is complete.     Silva Bandy, PA-C 09/03/21 1525

## 2021-09-03 NOTE — ED Triage Notes (Addendum)
Pt presents with c/o chest pains on both sides of her chest for approx 2 months. Pt reports that she can feel a pulse in her neck but can't feel her heartbeat when she puts her hand on her chest. Pt appears visibly anxious in triage, tearful. Pt reports her chest pain is only present when she drives her car.

## 2021-09-03 NOTE — ED Notes (Signed)
Patient was called for triage but no response. x1 

## 2021-10-15 ENCOUNTER — Other Ambulatory Visit: Payer: Self-pay

## 2021-10-15 ENCOUNTER — Encounter (HOSPITAL_COMMUNITY): Payer: Self-pay

## 2021-10-15 ENCOUNTER — Emergency Department (HOSPITAL_COMMUNITY)
Admission: EM | Admit: 2021-10-15 | Discharge: 2021-10-15 | Disposition: A | Discharge status: Home / Self Care | Payer: Medicaid Other | Attending: Emergency Medicine | Admitting: Emergency Medicine

## 2021-10-15 ENCOUNTER — Emergency Department (HOSPITAL_COMMUNITY): Payer: Medicaid Other

## 2021-10-15 DIAGNOSIS — F419 Anxiety disorder, unspecified: Secondary | ICD-10-CM | POA: Insufficient documentation

## 2021-10-15 DIAGNOSIS — R1013 Epigastric pain: Secondary | ICD-10-CM | POA: Diagnosis not present

## 2021-10-15 DIAGNOSIS — R0789 Other chest pain: Secondary | ICD-10-CM

## 2021-10-15 MED ORDER — HYOSCYAMINE SULFATE 0.125 MG SL SUBL
0.2500 mg | SUBLINGUAL_TABLET | Freq: Once | SUBLINGUAL | Status: AC
  Administered 2021-10-15: 0.25 mg via SUBLINGUAL
  Filled 2021-10-15: qty 2

## 2021-10-15 MED ORDER — ALUM & MAG HYDROXIDE-SIMETH 200-200-20 MG/5ML PO SUSP
30.0000 mL | Freq: Once | ORAL | Status: AC
Start: 2021-10-15 — End: 2021-10-15
  Administered 2021-10-15: 30 mL via ORAL
  Filled 2021-10-15: qty 30

## 2021-10-15 NOTE — ED Triage Notes (Signed)
Pt states that she is having chest pain that extends into her throat x 30 mins ago.
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
## 2021-10-25 ENCOUNTER — Emergency Department (HOSPITAL_COMMUNITY)
Admission: EM | Admit: 2021-10-25 | Discharge: 2021-10-26 | Discharge status: Against Medical Advice | Payer: Medicaid Other | Attending: Emergency Medicine | Admitting: Emergency Medicine

## 2021-10-25 ENCOUNTER — Encounter (HOSPITAL_COMMUNITY): Payer: Self-pay | Admitting: Emergency Medicine

## 2021-10-25 ENCOUNTER — Other Ambulatory Visit: Payer: Self-pay

## 2021-10-25 DIAGNOSIS — Z79899 Other long term (current) drug therapy: Secondary | ICD-10-CM | POA: Diagnosis not present

## 2021-10-25 DIAGNOSIS — R531 Weakness: Secondary | ICD-10-CM | POA: Insufficient documentation

## 2021-10-25 DIAGNOSIS — I10 Essential (primary) hypertension: Secondary | ICD-10-CM | POA: Insufficient documentation

## 2021-10-25 DIAGNOSIS — R451 Restlessness and agitation: Secondary | ICD-10-CM | POA: Insufficient documentation

## 2021-10-25 DIAGNOSIS — Z5321 Procedure and treatment not carried out due to patient leaving prior to being seen by health care provider: Secondary | ICD-10-CM | POA: Diagnosis not present

## 2021-10-25 DIAGNOSIS — F1910 Other psychoactive substance abuse, uncomplicated: Secondary | ICD-10-CM | POA: Diagnosis not present

## 2021-10-25 DIAGNOSIS — Z20822 Contact with and (suspected) exposure to covid-19: Secondary | ICD-10-CM | POA: Diagnosis not present
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
## 2021-10-26 ENCOUNTER — Encounter (HOSPITAL_COMMUNITY): Payer: Self-pay

## 2021-10-26 ENCOUNTER — Other Ambulatory Visit: Payer: Self-pay

## 2021-10-26 ENCOUNTER — Emergency Department (HOSPITAL_COMMUNITY)
Admission: EM | Admit: 2021-10-26 | Discharge: 2021-10-26 | Discharge status: Against Medical Advice | Payer: Medicaid Other | Source: Home / Self Care | Attending: Emergency Medicine | Admitting: Emergency Medicine

## 2021-10-26 DIAGNOSIS — R531 Weakness: Secondary | ICD-10-CM | POA: Insufficient documentation

## 2021-10-26 DIAGNOSIS — Z20822 Contact with and (suspected) exposure to covid-19: Secondary | ICD-10-CM | POA: Insufficient documentation

## 2021-10-26 DIAGNOSIS — I10 Essential (primary) hypertension: Secondary | ICD-10-CM | POA: Insufficient documentation

## 2021-10-26 DIAGNOSIS — Z5321 Procedure and treatment not carried out due to patient leaving prior to being seen by health care provider: Secondary | ICD-10-CM | POA: Diagnosis not present

## 2021-10-26 DIAGNOSIS — Z79899 Other long term (current) drug therapy: Secondary | ICD-10-CM | POA: Insufficient documentation

## 2021-10-26 DIAGNOSIS — F1910 Other psychoactive substance abuse, uncomplicated: Secondary | ICD-10-CM | POA: Insufficient documentation

## 2021-10-26 DIAGNOSIS — F99 Mental disorder, not otherwise specified: Secondary | ICD-10-CM

## 2021-10-26 DIAGNOSIS — F22 Delusional disorders: Secondary | ICD-10-CM | POA: Insufficient documentation

## 2021-10-26 DIAGNOSIS — R451 Restlessness and agitation: Secondary | ICD-10-CM | POA: Diagnosis present

## 2021-10-26 LAB — COMPREHENSIVE METABOLIC PANEL
ALT: 12 U/L (ref 0–44)
AST: 17 U/L (ref 15–41)
Albumin: 4.3 g/dL (ref 3.5–5.0)
Alkaline Phosphatase: 50 U/L (ref 38–126)
Anion gap: 9 (ref 5–15)
BUN: 16 mg/dL (ref 6–20)
CO2: 22 mmol/L (ref 22–32)
Calcium: 9.4 mg/dL (ref 8.9–10.3)
Chloride: 114 mmol/L — ABNORMAL HIGH (ref 98–111)
Creatinine, Ser: 0.65 mg/dL (ref 0.44–1.00)
GFR, Estimated: >60 mL/min (ref >60)
Glucose, Bld: 111 mg/dL — ABNORMAL HIGH (ref 70–99)
Potassium: 3.4 mmol/L — ABNORMAL LOW (ref 3.5–5.1)
Sodium: 145 mmol/L (ref 135–145)
Total Bilirubin: 1 mg/dL (ref 0.3–1.2)
Total Protein: 7.2 g/dL (ref 6.5–8.1)

## 2021-10-26 LAB — CBC WITH DIFFERENTIAL/PLATELET
Abs Immature Granulocytes: 0.05 10*3/uL (ref 0.00–0.07)
Basophils Absolute: 0.1 10*3/uL (ref 0.0–0.1)
Basophils Relative: 0 %
Eosinophils Absolute: 0 10*3/uL (ref 0.0–0.5)
Eosinophils Relative: 0 %
HCT: 41 % (ref 36.0–46.0)
Hemoglobin: 14 g/dL (ref 12.0–15.0)
Immature Granulocytes: 0 %
Lymphocytes Relative: 10 %
Lymphs Abs: 1.1 10*3/uL (ref 0.7–4.0)
MCH: 33.4 pg (ref 26.0–34.0)
MCHC: 34.1 g/dL (ref 30.0–36.0)
MCV: 97.9 fL (ref 80.0–100.0)
Monocytes Absolute: 0.8 10*3/uL (ref 0.1–1.0)
Monocytes Relative: 6 %
Neutro Abs: 9.9 10*3/uL — ABNORMAL HIGH (ref 1.7–7.7)
Neutrophils Relative %: 84 %
Platelets: 317 10*3/uL (ref 150–400)
RBC: 4.19 MIL/uL (ref 3.87–5.11)
RDW: 12.3 % (ref 11.5–15.5)
WBC: 11.9 10*3/uL — ABNORMAL HIGH (ref 4.0–10.5)
nRBC: 0 % (ref 0.0–0.2)

## 2021-10-26 LAB — I-STAT BETA HCG BLOOD, ED (MC, WL, AP ONLY): I-stat hCG, quantitative: <5 m[IU]/mL (ref <5)

## 2021-10-26 LAB — RAPID URINE DRUG SCREEN, HOSP PERFORMED
Amphetamines: NOT DETECTED
Barbiturates: NOT DETECTED
Benzodiazepines: NOT DETECTED
Cocaine: NOT DETECTED
Opiates: NOT DETECTED
Tetrahydrocannabinol: NOT DETECTED

## 2021-10-26 LAB — RESP PANEL BY RT-PCR (FLU A&B, COVID) ARPGX2
Influenza A by PCR: NEGATIVE
Influenza B by PCR: NEGATIVE
SARS Coronavirus 2 by RT PCR: NEGATIVE

## 2021-10-26 LAB — SALICYLATE LEVEL: Salicylate Lvl: <7 mg/dL — ABNORMAL LOW (ref 7.0–30.0)

## 2021-10-26 LAB — ETHANOL: Alcohol, Ethyl (B): <10 mg/dL (ref <10)

## 2021-10-26 LAB — ACETAMINOPHEN LEVEL: Acetaminophen (Tylenol), Serum: <10 ug/mL — ABNORMAL LOW (ref 10–30)
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
## 2021-10-26 NOTE — ED Notes (Addendum)
This RN went to talk to patient. Patient stated she is not staying and stood up and walked out of the Emergency Dept. Dr. Rhunette Croft informed

## 2021-10-26 NOTE — ED Triage Notes (Signed)
Pt is talking in incomplete sentences. 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 Pt reports having a drug problem.

## 2021-10-29 ENCOUNTER — Emergency Department (HOSPITAL_COMMUNITY)
Admission: EM | Admit: 2021-10-29 | Discharge: 2021-10-29 | Discharge status: Against Medical Advice | Payer: Medicaid Other | Attending: Emergency Medicine | Admitting: Emergency Medicine

## 2021-10-29 ENCOUNTER — Other Ambulatory Visit: Payer: Self-pay

## 2021-10-29 ENCOUNTER — Encounter (HOSPITAL_COMMUNITY): Payer: Self-pay

## 2021-10-29 DIAGNOSIS — R002 Palpitations: Secondary | ICD-10-CM | POA: Diagnosis present

## 2021-10-29 DIAGNOSIS — Z5321 Procedure and treatment not carried out due to patient leaving prior to being seen by health care provider: Secondary | ICD-10-CM | POA: Insufficient documentation

## 2021-10-29 NOTE — ED Triage Notes (Signed)
Reports palpitations for > 1 month. Pt denies any pain or palpitation sensation currently.

## 2021-11-06 ENCOUNTER — Encounter (HOSPITAL_COMMUNITY): Payer: Self-pay

## 2021-11-06 ENCOUNTER — Emergency Department (HOSPITAL_COMMUNITY)
Admission: EM | Admit: 2021-11-06 | Discharge: 2021-11-06 | Disposition: A | Discharge status: Home / Self Care | Payer: Medicaid Other | Attending: Emergency Medicine | Admitting: Emergency Medicine

## 2021-11-06 ENCOUNTER — Emergency Department (HOSPITAL_COMMUNITY): Payer: Medicaid Other

## 2021-11-06 DIAGNOSIS — R509 Fever, unspecified: Secondary | ICD-10-CM | POA: Diagnosis not present

## 2021-11-06 DIAGNOSIS — H9201 Otalgia, right ear: Secondary | ICD-10-CM | POA: Diagnosis not present

## 2021-11-06 DIAGNOSIS — Z20822 Contact with and (suspected) exposure to covid-19: Secondary | ICD-10-CM | POA: Diagnosis not present

## 2021-11-06 DIAGNOSIS — M26601 Right temporomandibular joint disorder, unspecified: Secondary | ICD-10-CM | POA: Diagnosis not present

## 2021-11-06 DIAGNOSIS — R0602 Shortness of breath: Secondary | ICD-10-CM | POA: Diagnosis not present

## 2021-11-06 DIAGNOSIS — I10 Essential (primary) hypertension: Secondary | ICD-10-CM | POA: Diagnosis not present

## 2021-11-06 DIAGNOSIS — R079 Chest pain, unspecified: Secondary | ICD-10-CM

## 2021-11-06 DIAGNOSIS — R0789 Other chest pain: Secondary | ICD-10-CM | POA: Diagnosis not present

## 2021-11-06 DIAGNOSIS — M26621 Arthralgia of right temporomandibular joint: Secondary | ICD-10-CM

## 2021-11-06 DIAGNOSIS — J069 Acute upper respiratory infection, unspecified: Secondary | ICD-10-CM

## 2021-11-06 LAB — CBC
HCT: 39.7 % (ref 36.0–46.0)
Hemoglobin: 13 g/dL (ref 12.0–15.0)
MCH: 33.6 pg (ref 26.0–34.0)
MCHC: 32.7 g/dL (ref 30.0–36.0)
MCV: 102.6 fL — ABNORMAL HIGH (ref 80.0–100.0)
Platelets: 341 10*3/uL (ref 150–400)
RBC: 3.87 MIL/uL (ref 3.87–5.11)
RDW: 12.9 % (ref 11.5–15.5)
WBC: 8 10*3/uL (ref 4.0–10.5)
nRBC: 0 % (ref 0.0–0.2)

## 2021-11-06 LAB — BASIC METABOLIC PANEL
Anion gap: 5 (ref 5–15)
BUN: 15 mg/dL (ref 6–20)
CO2: 26 mmol/L (ref 22–32)
Calcium: 8.8 mg/dL — ABNORMAL LOW (ref 8.9–10.3)
Chloride: 107 mmol/L (ref 98–111)
Creatinine, Ser: 0.86 mg/dL (ref 0.44–1.00)
GFR, Estimated: >60 mL/min (ref >60)
Glucose, Bld: 97 mg/dL (ref 70–99)
Potassium: 3.7 mmol/L (ref 3.5–5.1)
Sodium: 138 mmol/L (ref 135–145)

## 2021-11-06 LAB — RESP PANEL BY RT-PCR (FLU A&B, COVID) ARPGX2
Influenza A by PCR: NEGATIVE
Influenza B by PCR: NEGATIVE
SARS Coronavirus 2 by RT PCR: NEGATIVE

## 2021-11-06 LAB — TROPONIN I (HIGH SENSITIVITY): Troponin I (High Sensitivity): <2 ng/L (ref <18)

## 2021-11-06 MED ORDER — ACETAMINOPHEN 325 MG PO TABS
650.0000 mg | ORAL_TABLET | Freq: Once | ORAL | Status: AC
  Administered 2021-11-06: 650 mg via ORAL
  Filled 2021-11-06: qty 2

## 2021-11-07 ENCOUNTER — Encounter (HOSPITAL_COMMUNITY): Payer: Self-pay

## 2021-11-07 ENCOUNTER — Encounter (HOSPITAL_COMMUNITY): Payer: Self-pay | Admitting: Emergency Medicine

## 2021-11-07 ENCOUNTER — Emergency Department (HOSPITAL_COMMUNITY)
Admission: EM | Admit: 2021-11-07 | Discharge: 2021-11-09 | Disposition: A | Discharge status: Home / Self Care | Payer: Medicaid Other | Attending: Emergency Medicine | Admitting: Emergency Medicine

## 2021-11-07 ENCOUNTER — Other Ambulatory Visit: Payer: Self-pay

## 2021-11-07 DIAGNOSIS — F333 Major depressive disorder, recurrent, severe with psychotic symptoms: Secondary | ICD-10-CM | POA: Diagnosis not present

## 2021-11-07 DIAGNOSIS — F29 Unspecified psychosis not due to a substance or known physiological condition: Secondary | ICD-10-CM | POA: Diagnosis not present

## 2021-11-07 DIAGNOSIS — E876 Hypokalemia: Secondary | ICD-10-CM | POA: Insufficient documentation

## 2021-11-07 DIAGNOSIS — Z20822 Contact with and (suspected) exposure to covid-19: Secondary | ICD-10-CM | POA: Insufficient documentation

## 2021-11-07 DIAGNOSIS — D72829 Elevated white blood cell count, unspecified: Secondary | ICD-10-CM | POA: Diagnosis not present

## 2021-11-07 DIAGNOSIS — F323 Major depressive disorder, single episode, severe with psychotic features: Secondary | ICD-10-CM | POA: Diagnosis not present

## 2021-11-07 DIAGNOSIS — F32A Depression, unspecified: Secondary | ICD-10-CM

## 2021-11-07 DIAGNOSIS — Z79899 Other long term (current) drug therapy: Secondary | ICD-10-CM | POA: Diagnosis not present

## 2021-11-07 DIAGNOSIS — Z046 Encounter for general psychiatric examination, requested by authority: Secondary | ICD-10-CM | POA: Diagnosis present

## 2021-11-07 MED ORDER — OLANZAPINE 5 MG PO TBDP: 5.0000 mg | ORAL_TABLET | Freq: Three times a day (TID) | ORAL | Status: DC | PRN

## 2021-11-07 MED ORDER — LORAZEPAM 1 MG PO TABS: 1.0000 mg | ORAL_TABLET | ORAL | Status: DC | PRN

## 2021-11-07 MED ORDER — ZIPRASIDONE MESYLATE 20 MG IM SOLR
20.0000 mg | INTRAMUSCULAR | Status: AC | PRN
  Administered 2021-11-08: 20 mg via INTRAMUSCULAR
  Filled 2021-11-07: qty 20
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
## 2021-11-07 NOTE — ED Notes (Signed)
Pt was informed of [facility] Policy regarding dressing out into hospital attire, all belongings to be placed into belonging bags and labeled with pt ID and placed into secure storage until d/c. Pt acknowledged these instructions I provided. Pt had no further questions or concerns at this time.
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
## 2021-11-08 DIAGNOSIS — F323 Major depressive disorder, single episode, severe with psychotic features: Secondary | ICD-10-CM

## 2021-11-08 LAB — RESP PANEL BY RT-PCR (FLU A&B, COVID) ARPGX2
Influenza A by PCR: NEGATIVE
Influenza B by PCR: NEGATIVE
SARS Coronavirus 2 by RT PCR: NEGATIVE

## 2021-11-08 LAB — COMPREHENSIVE METABOLIC PANEL
ALT: 16 U/L (ref 0–44)
AST: 20 U/L (ref 15–41)
Albumin: 4.3 g/dL (ref 3.5–5.0)
Alkaline Phosphatase: 49 U/L (ref 38–126)
Anion gap: 10 (ref 5–15)
BUN: 12 mg/dL (ref 6–20)
CO2: 22 mmol/L (ref 22–32)
Calcium: 9 mg/dL (ref 8.9–10.3)
Chloride: 106 mmol/L (ref 98–111)
Creatinine, Ser: 0.69 mg/dL (ref 0.44–1.00)
GFR, Estimated: >60 mL/min (ref >60)
Glucose, Bld: 95 mg/dL (ref 70–99)
Potassium: 3.3 mmol/L — ABNORMAL LOW (ref 3.5–5.1)
Sodium: 138 mmol/L (ref 135–145)
Total Bilirubin: 1 mg/dL (ref 0.3–1.2)
Total Protein: 7.1 g/dL (ref 6.5–8.1)

## 2021-11-08 LAB — CBC WITH DIFFERENTIAL/PLATELET
Abs Immature Granulocytes: 0.14 10*3/uL — ABNORMAL HIGH (ref 0.00–0.07)
Basophils Absolute: 0.1 10*3/uL (ref 0.0–0.1)
Basophils Relative: 1 %
Eosinophils Absolute: 0.1 10*3/uL (ref 0.0–0.5)
Eosinophils Relative: 1 %
HCT: 43.4 % (ref 36.0–46.0)
Hemoglobin: 14.3 g/dL (ref 12.0–15.0)
Immature Granulocytes: 1 %
Lymphocytes Relative: 28 %
Lymphs Abs: 3.1 10*3/uL (ref 0.7–4.0)
MCH: 33.5 pg (ref 26.0–34.0)
MCHC: 32.9 g/dL (ref 30.0–36.0)
MCV: 101.6 fL — ABNORMAL HIGH (ref 80.0–100.0)
Monocytes Absolute: 0.9 10*3/uL (ref 0.1–1.0)
Monocytes Relative: 8 %
Neutro Abs: 6.9 10*3/uL (ref 1.7–7.7)
Neutrophils Relative %: 61 %
Platelets: 340 10*3/uL (ref 150–400)
RBC: 4.27 MIL/uL (ref 3.87–5.11)
RDW: 12.9 % (ref 11.5–15.5)
WBC: 11.2 10*3/uL — ABNORMAL HIGH (ref 4.0–10.5)
nRBC: 0 % (ref 0.0–0.2)

## 2021-11-08 LAB — RAPID URINE DRUG SCREEN, HOSP PERFORMED
Amphetamines: NOT DETECTED
Barbiturates: NOT DETECTED
Benzodiazepines: NOT DETECTED
Cocaine: NOT DETECTED
Opiates: NOT DETECTED
Tetrahydrocannabinol: NOT DETECTED

## 2021-11-08 LAB — I-STAT BETA HCG BLOOD, ED (MC, WL, AP ONLY)
I-stat hCG, quantitative: <5 m[IU]/mL (ref <5)
I-stat hCG, quantitative: <5 m[IU]/mL (ref <5)

## 2021-11-08 LAB — ETHANOL: Alcohol, Ethyl (B): <10 mg/dL (ref <10)

## 2021-11-08 LAB — ACETAMINOPHEN LEVEL: Acetaminophen (Tylenol), Serum: <10 ug/mL — ABNORMAL LOW (ref 10–30)

## 2021-11-08 LAB — SALICYLATE LEVEL: Salicylate Lvl: <7 mg/dL — ABNORMAL LOW (ref 7.0–30.0)

## 2021-11-08 MED ORDER — ACETAMINOPHEN 325 MG PO TABS
650.0000 mg | ORAL_TABLET | ORAL | Status: AC
  Administered 2021-11-08: 650 mg via ORAL
  Filled 2021-11-08: qty 2

## 2021-11-08 MED ORDER — STERILE WATER FOR INJECTION IJ SOLN
INTRAMUSCULAR | Status: AC
  Administered 2021-11-08: 2 mL via INTRAMUSCULAR
  Filled 2021-11-08: qty 10

## 2021-11-08 MED ORDER — OLANZAPINE 5 MG PO TBDP
5.0000 mg | ORAL_TABLET | Freq: Every day | ORAL | Status: DC
  Filled 2021-11-08: qty 1

## 2021-11-08 MED ORDER — ONDANSETRON 4 MG PO TBDP
4.0000 mg | ORAL_TABLET | Freq: Once | ORAL | Status: AC
  Administered 2021-11-08: 4 mg via ORAL
  Filled 2021-11-08: qty 1

## 2021-11-08 MED ORDER — ESCITALOPRAM OXALATE 10 MG PO TABS
10.0000 mg | ORAL_TABLET | Freq: Every day | ORAL | Status: DC
Start: 2021-11-08 — End: 2021-11-09
  Administered 2021-11-08: 10 mg via ORAL
  Filled 2021-11-08 (×2): qty 1

## 2021-11-08 MED ORDER — ONDANSETRON HCL 4 MG/2ML IJ SOLN
4.0000 mg | Freq: Once | INTRAMUSCULAR | Status: DC
  Filled 2021-11-08: qty 2

## 2021-11-08 NOTE — Progress Notes (Signed)
Inpatient Behavioral Health Placement  Pt meets inpatient criteria per ,Earney Navy, NP.  There are no available beds at [facility] per [facility]  Rosey Bath, RN. Referral was sent to the following facilities;   Destination Service Provider Address Phone Fax  [facility] [place] [address] [phone]  [facility]  [address] [place], [place] [address] [phone]  [facility]  [address] [place] [address] [phone]  [facility]  [address] [place] [address] [phone]  [facility]  [address] [place] [address] [phone]  [facility]  [address] [place] [address] [phone]  [facility]  [address], [place] [address] [phone]  [facility]  [address] [place] [address] [phone]  [facility]  [address] [place] [address] [phone]  [facility]  [address], [place] [address] [phone]  [facility]  [address] [place] [address] [phone]  [facility]  [address], [place] [address] [phone]    Situation ongoing,  CSW will follow up.   Maryjean Ka, MSW, LCSWA 11/08/2021  @ 6:11 PM

## 2021-11-08 NOTE — ED Notes (Signed)
Gave report to RN at [facility] and she said she will talk to the Dr and call back with a bed if they can accept her
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
## 2021-11-08 NOTE — ED Notes (Signed)
Pt belonging:two bags place at nurse desk-----blue shirt, blk sweater, shoes, blue jean

## 2021-11-08 NOTE — ED Provider Notes (Incomplete)
  [facility] DEPT Provider Note   CSN: [id number] Arrival date & time: 11/07/21  2313     History {Add pertinent medical, surgical, social history, OB history to HPI:1} Chief Complaint  Patient presents with  . Mental Health Problem    Ivis Nicolson Urbanik is a 36 y.o. female.  HPI     Home Medications Prior to Admission medications   Medication Sig Start Date End Date Taking? Authorizing Provider  Blood Pressure Monitoring (BLOOD PRESSURE KIT) DEVI 1 kit by Does not apply route once a week. Check Blood Pressure regularly and record readings into the Bab[employer]s App.  Large Cuff.  DX O90.0 06/05/19   Woodroe Mode, MD  Prenat-FeAsp-Meth-FA-DHA w/o A (PRENATE PIXIE) 10-0.6-0.4-200 MG CAPS Take 1 tablet by mouth daily. 05/08/19   Woodroe Mode, MD  sodium chloride (OCEAN) 0.65 % SOLN nasal spray Place 1 spray into both nostrils as needed for congestion. 08/11/20   Carmin Muskrat, MD      Allergies    Penicillins    Review of Systems   Review of Systems  Physical Exam Updated Vital Signs BP 127/85   Pulse (!) 103   Temp 97.8 F (36.6 C) (Oral)   Resp 16   Ht $R'5\' 2"'uz$  (1.575 m)   Wt 52 kg   LMP 10/23/2021 (Exact Date)   SpO2 100%   BMI 20.97 kg/m  Physical Exam  ED Results / Procedures / Treatments   Labs (all labs ordered are listed, but only abnormal results are displayed) Labs Reviewed  RESP PANEL BY RT-PCR (FLU A&B, COVID) ARPGX2  COMPREHENSIVE METABOLIC PANEL  ETHANOL  RAPID URINE DRUG SCREEN, HOSP PERFORMED  CBC WITH DIFFERENTIAL/PLATELET  ACETAMINOPHEN LEVEL  SALICYLATE LEVEL  I-STAT BETA HCG BLOOD, ED ([facility], [facility], [facility] ONLY)    EKG None  Radiology DG Chest 2 View  Result Date: 11/06/2021 CLINICAL DATA:  chest pain EXAM: CHEST - 2 VIEW COMPARISON:  None Available. FINDINGS: The cardiomediastinal silhouette is normal in contour. No pleural effusion. No pneumothorax. No acute pleuroparenchymal abnormality. Visualized abdomen  is unremarkable. No acute osseous abnormality noted. IMPRESSION: No acute cardiopulmonary abnormality. Electronically Signed   By: Valentino Saxon M.D.   On: 11/06/2021 14:48    Procedures Procedures  {Document cardiac monitor, telemetry assessment procedure when appropriate:1}  Medications Ordered in ED Medications  OLANZapine zydis (ZYPREXA) disintegrating tablet 5 mg (has no administration in time range)    And  LORazepam (ATIVAN) tablet 1 mg (has no administration in time range)    And  ziprasidone (GEODON) injection 20 mg (has no administration in time range)    ED Course/ Medical Decision Making/ A&P                           Medical Decision Making Amount and/or Complexity of Data Reviewed Labs: ordered.   ***  {Document critical care time when appropriate:1} {Document review of labs and clinical decision tools ie heart score, Chads2Vasc2 etc:1}  {Document your independent review of radiology images, and any outside records:1} {Document your discussion with family members, caretakers, and with consultants:1} {Document social determinants of health affecting pt's care:1} {Document your decision making why or why not admission, treatments were needed:1} Final Clinical Impression(s) / ED Diagnoses Final diagnoses:  None    Rx / DC Orders ED Discharge Orders     None

## 2021-11-08 NOTE — ED Provider Notes (Signed)
Emergency Medicine Observation Re-evaluation Note  Deborah Park is a 36 y.o. female, seen on rounds today.  Pt initially presented to the ED for complaints of Mental Health Problem Currently, the patient is being evaluated for psychiatric admission.  Physical Exam  BP 91/62   Pulse 81   Temp 98.8 F (37.1 C) (Oral)   Resp 15   Ht 1.575 m (5\' 2" )   Wt 52 kg   LMP 10/23/2021 (Exact Date)   SpO2 97%   BMI 20.97 kg/m  Physical Exam General: Well-developed well-nourished patient in no acute distress Cardiac: Regular rate and rhythm Lungs: Clear to auscultation Psych: Appears to have some thought disorder  ED Course / MDM  EKG:   I have reviewed the labs performed to date as well as medications administered while in observation.  Recent changes in the last 24 hours include medically cleared and psych has seen and evaluated.  Plan  Current plan is for psychiatric admission. Deborah Park 10/25/2021 is under involuntary commitment.      [place], MD 11/08/21 [phone]
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
## 2021-11-09 NOTE — ED Provider Notes (Signed)
Emergency Medicine Observation Re-evaluation Note  Deborah Park is a 36 y.o. female, seen on rounds today.  Pt initially presented to the ED for complaints of Mental Health Problem Currently, the patient is ivc'd and being admitted.  Physical Exam  BP 105/86   Pulse 70   Temp 98 F (36.7 C) (Oral)   Resp 14   Ht 1.575 m (5\' 2" )   Wt 52 kg   LMP 10/23/2021 (Exact Date)   SpO2 100%   BMI 20.97 kg/m  Physical Exam General: wdwn Cardiac: rrr Lungs: no distress Psych: resting  ED Course / MDM  EKG:EKG Interpretation  Date/Time:  Monday November 07 2021 23:51:34 EDT Ventricular Rate:  93 PR Interval:  182 QRS Duration: 96 QT Interval:  371 QTC Calculation: 462 R Axis:   90 Text Interpretation: Sinus rhythm RAE, consider biatrial enlargement Borderline right axis deviation No significant change since last tracing Confirmed by 11-16-1996 [phone]) on 11/08/2021 3:37:32 PM  I have reviewed the labs performed to date as well as medications administered while in observation.  Recent changes in the last 24 hours include plan for admission.  Plan  Current plan is for patient is being tranferred to [facility]. Deborah Park [facility] is under involuntary commitment.      [place], MD 11/09/21 [phone]

## 2021-11-09 NOTE — ED Notes (Signed)
Pt accepted at [facility] with Dr. Landry Mellow as accepting physician. Report to be called to [phone]. Bed currently ready

## 2021-11-09 NOTE — ED Notes (Signed)
Pt agitated, arguing with staff, stating she is leaving and we can not stop her. Pt is under IVC. Pt continues to come out of room in attempts to leave. Security and [employer] present as she tries to fight with them as an attempt to leave. Geodon ordered and given. Pt compliant, resting on stretcher after receiving medication.
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
## 2021-12-14 ENCOUNTER — Encounter (HOSPITAL_COMMUNITY): Payer: Self-pay

## 2021-12-14 ENCOUNTER — Other Ambulatory Visit: Payer: Self-pay

## 2021-12-14 ENCOUNTER — Emergency Department (HOSPITAL_COMMUNITY)
Admission: EM | Admit: 2021-12-14 | Discharge: 2021-12-14 | Disposition: A | Discharge status: Home / Self Care | Payer: Medicaid Other | Attending: Emergency Medicine | Admitting: Emergency Medicine

## 2021-12-14 DIAGNOSIS — R300 Dysuria: Secondary | ICD-10-CM | POA: Diagnosis present

## 2021-12-14 DIAGNOSIS — I10 Essential (primary) hypertension: Secondary | ICD-10-CM | POA: Diagnosis not present

## 2021-12-14 LAB — URINALYSIS, ROUTINE W REFLEX MICROSCOPIC
Bilirubin Urine: NEGATIVE
Glucose, UA: NEGATIVE mg/dL
Hgb urine dipstick: NEGATIVE
Ketones, ur: NEGATIVE mg/dL
Leukocytes,Ua: NEGATIVE
Nitrite: NEGATIVE
Protein, ur: NEGATIVE mg/dL
Specific Gravity, Urine: 1.011 (ref 1.005–1.030)
pH: 5 (ref 5.0–8.0)

## 2021-12-14 NOTE — Discharge Instructions (Signed)
You were evaluated in the Emergency Department and after careful evaluation, we did not find any emergent condition requiring admission or further testing in the hospital.  Your exam/testing today is overall reassuring.  Recommend follow-up with urology if symptoms continue  Please return to the Emergency Department if you experience any worsening of your condition.   Thank you for allowing [place] to be a part of your care.

## 2021-12-14 NOTE — ED Triage Notes (Signed)
Pt reports Hematuria, strong odor to urine, urinary frequency x3 weeks.

## 2021-12-14 NOTE — ED Provider Notes (Signed)
[facility]-EMERGENCY DEPT [facility] Emergency Department Provider Note MRN:  [id number]  Arrival date & time: 12/14/21     Chief Complaint   Urinary Frequency   History of Present Illness   Deborah Park is a 36 y.o. year-old female with a history of hypertension, mental disorder presenting to the ED with chief complaint of urinary frequency.  Frequent urination, burning with urination, blood in the urine for the past few weeks.  Also foul odor.  She describes the odor as "the smell of a man's urine".  Sometimes she smells a man's urine when she is not urinating, simply walking the halls.  Review of Systems  A thorough review of systems was obtained and all systems are negative except as noted in the HPI and PMH.   Patient's Health History    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HCC)    Hypertension    Memory loss 09/17/2013   Mental disorder    Sleep disturbance 09/17/2013    Past Surgical History:  Procedure Laterality Date   KIDNEY SURGERY     MOUTH SURGERY      Family History  Problem Relation Age of Onset   Healthy Mother    Healthy Father    Bipolar disorder Maternal Grandmother    Seizures Neg Hx    Parkinsonism Neg Hx    Multiple sclerosis Neg Hx    Dementia Neg Hx     Social History   Socioeconomic History   Marital status: Single    Spouse name: Not on file   Number of children: 1   Years of education: College   Highest education level: Not on file  Occupational History    Employer: NOT EMPLOYED    Comment: not employed  Tobacco Use   Smoking status: Never   Smokeless tobacco: Never  Vaping Use   Vaping Use: Never used  Substance and Sexual Activity   Alcohol use: Never   Drug use: Never    Frequency: 1.0 times per week    Types: Cocaine   Sexual activity: Not Currently    Birth control/protection: None  Other Topics Concern   Not on file  Social History Narrative   ** Merged History  Encounter **       ** Merged History Encounter **       ** Merged History Encounter **       Patient lives at home with son.   Caffeine use; 1-3 cups daily,is right handed   Social Determinants of Health   Financial Resource Strain: Not on file  Food Insecurity: Not on file  Transportation Needs: Not on file  Physical Activity: Not on file  Stress: Not on file  Social Connections: Not on file  Intimate Partner Violence: Not on file     Physical Exam   Vitals:   12/14/21 0408  BP: 116/80  Pulse: 80  Resp: 18  Temp: 98.3 F (36.8 C)  SpO2: 96%    CONSTITUTIONAL: Well-appearing, NAD NEURO/PSYCH:  Alert and oriented x 3, no focal deficits, bizarre affect EYES:  eyes equal and reactive ENT/NECK:  no LAD, no JVD CARDIO: Regular rate, well-perfused, normal S1 and S2 PULM:  CTAB no wheezing or rhonchi GI/GU:  non-distended, non-tender MSK/SPINE:  No gross deformities, no edema SKIN:  no rash, atraumatic   *Additional and/or pertinent findings included in MDM below  Diagnostic and Interventional Summary    EKG Interpretation  Date/Time:  Ventricular Rate:    PR Interval:    QRS Duration:   QT Interval:    QTC Calculation:   R Axis:     Text Interpretation:         Labs Reviewed  URINE CULTURE  URINALYSIS, ROUTINE W REFLEX MICROSCOPIC    No orders to display    Medications - No data to display   Procedures  /  Critical Care Procedures  ED Course and Medical Decision Making  Initial Impression and Ddx Patient has normal vital signs, soft nontender abdomen, denies flank pain, no fever, suspicious for UTI but also suspicious for underlying psychiatric cause of symptoms.  Past medical/surgical history that increases complexity of ED encounter: Mental disorder  Interpretation of Diagnostics I personally reviewed the urinalysis and my interpretation is as follows: Normal    Patient Reassessment and Ultimate Disposition/Management     Patient  denies hallucinations, says that her mental health is good, no indication for further testing or admission, no signs of emergent process, appropriate for discharge.  Patient management required discussion with the following services or consulting groups:  None  Complexity of Problems Addressed Acute complicated illness or Injury  Additional Data Reviewed and Analyzed Further history obtained from: None  Additional Factors Impacting ED Encounter Risk None  Elmer Sow. Pilar Plate, MD [facility] Emergency Medicine [facility] [email or web address]  Final Clinical Impressions(s) / ED Diagnoses     ICD-10-CM   1. Dysuria  R30.0       ED Discharge Orders     None        Discharge Instructions Discussed with and Provided to Patient:    Discharge Instructions      You were evaluated in the Emergency Department and after careful evaluation, we did not find any emergent condition requiring admission or further testing in the hospital.  Your exam/testing today is overall reassuring.  Recommend follow-up with urology if symptoms continue  Please return to the Emergency Department if you experience any worsening of your condition.   Thank you for allowing [place] to be a part of your care.      Sabas Sous, MD 12/14/21 [phone]

## 2021-12-15 LAB — URINE CULTURE: Culture: NO GROWTH

## 2022-07-03 ENCOUNTER — Emergency Department (HOSPITAL_COMMUNITY): Admission: EM | Admit: 2022-07-03 | Payer: Medicaid Other | Source: Home / Self Care

## 2022-07-04 ENCOUNTER — Emergency Department (HOSPITAL_COMMUNITY): Admission: EM | Admit: 2022-07-04 | Discharge: 2022-07-04 | Payer: Medicaid Other

## 2022-07-04 ENCOUNTER — Emergency Department (HOSPITAL_COMMUNITY): Payer: Medicaid Other

## 2022-07-04 ENCOUNTER — Emergency Department (HOSPITAL_COMMUNITY)
Admission: EM | Admit: 2022-07-04 | Discharge: 2022-07-04 | Disposition: A | Discharge status: Home / Self Care | Payer: Medicaid Other | Attending: Emergency Medicine | Admitting: Emergency Medicine

## 2022-07-04 ENCOUNTER — Other Ambulatory Visit: Payer: Self-pay

## 2022-07-04 DIAGNOSIS — R000 Tachycardia, unspecified: Secondary | ICD-10-CM | POA: Insufficient documentation

## 2022-07-04 DIAGNOSIS — R42 Dizziness and giddiness: Secondary | ICD-10-CM

## 2022-07-04 DIAGNOSIS — J449 Chronic obstructive pulmonary disease, unspecified: Secondary | ICD-10-CM | POA: Insufficient documentation

## 2022-07-04 DIAGNOSIS — Z1152 Encounter for screening for COVID-19: Secondary | ICD-10-CM | POA: Insufficient documentation

## 2022-07-04 LAB — CBC
HCT: 42.9 % (ref 36.0–46.0)
Hemoglobin: 14.5 g/dL (ref 12.0–15.0)
MCH: 33 pg (ref 26.0–34.0)
MCHC: 33.8 g/dL (ref 30.0–36.0)
MCV: 97.7 fL (ref 80.0–100.0)
Platelets: 277 10*3/uL (ref 150–400)
RBC: 4.39 MIL/uL (ref 3.87–5.11)
RDW: 12.6 % (ref 11.5–15.5)
WBC: 9.3 10*3/uL (ref 4.0–10.5)
nRBC: 0 % (ref 0.0–0.2)

## 2022-07-04 LAB — SARS CORONAVIRUS 2 BY RT PCR: SARS Coronavirus 2 by RT PCR: NEGATIVE

## 2022-07-04 LAB — BASIC METABOLIC PANEL
Anion gap: 8 (ref 5–15)
BUN: 20 mg/dL (ref 8–23)
CO2: 23 mmol/L (ref 22–32)
Calcium: 8.9 mg/dL (ref 8.9–10.3)
Chloride: 104 mmol/L (ref 98–111)
Creatinine, Ser: 0.67 mg/dL (ref 0.44–1.00)
GFR, Estimated: >60 mL/min (ref >60)
Glucose, Bld: 84 mg/dL (ref 70–99)
Potassium: 3.7 mmol/L (ref 3.5–5.1)
Sodium: 135 mmol/L (ref 135–145)

## 2022-07-04 LAB — MAGNESIUM: Magnesium: 2 mg/dL (ref 1.7–2.4)

## 2022-07-04 MED ORDER — SODIUM CHLORIDE 0.9 % IV BOLUS
1000.0000 mL | Freq: Once | INTRAVENOUS | Status: AC
  Administered 2022-07-04: 1000 mL via INTRAVENOUS
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
## 2022-10-03 ENCOUNTER — Other Ambulatory Visit: Payer: Self-pay

## 2022-10-03 ENCOUNTER — Emergency Department (HOSPITAL_COMMUNITY): Payer: MEDICAID

## 2022-10-03 ENCOUNTER — Emergency Department (HOSPITAL_COMMUNITY)
Admission: EM | Admit: 2022-10-03 | Discharge: 2022-10-03 | Disposition: A | Discharge status: Against Medical Advice | Payer: MEDICAID | Attending: Emergency Medicine | Admitting: Emergency Medicine

## 2022-10-03 DIAGNOSIS — M7918 Myalgia, other site: Secondary | ICD-10-CM | POA: Diagnosis not present

## 2022-10-03 DIAGNOSIS — R059 Cough, unspecified: Secondary | ICD-10-CM | POA: Insufficient documentation

## 2022-10-03 DIAGNOSIS — K625 Hemorrhage of anus and rectum: Secondary | ICD-10-CM | POA: Diagnosis not present

## 2022-10-03 DIAGNOSIS — Z5321 Procedure and treatment not carried out due to patient leaving prior to being seen by health care provider: Secondary | ICD-10-CM | POA: Insufficient documentation

## 2022-10-03 DIAGNOSIS — R1110 Vomiting, unspecified: Secondary | ICD-10-CM | POA: Diagnosis not present

## 2022-10-03 DIAGNOSIS — R079 Chest pain, unspecified: Secondary | ICD-10-CM | POA: Insufficient documentation

## 2022-10-03 LAB — CBC
HCT: 47.3 % — ABNORMAL HIGH (ref 36.0–46.0)
Hemoglobin: 15.7 g/dL — ABNORMAL HIGH (ref 12.0–15.0)
MCH: 33 pg (ref 26.0–34.0)
MCHC: 33.2 g/dL (ref 30.0–36.0)
MCV: 99.4 fL (ref 80.0–100.0)
Platelets: 315 10*3/uL (ref 150–400)
RBC: 4.76 MIL/uL (ref 3.87–5.11)
RDW: 12.3 % (ref 11.5–15.5)
WBC: 10.1 10*3/uL (ref 4.0–10.5)
nRBC: 0 % (ref 0.0–0.2)

## 2022-10-03 LAB — BASIC METABOLIC PANEL
Anion gap: 7 (ref 5–15)
BUN: 15 mg/dL (ref 6–20)
CO2: 26 mmol/L (ref 22–32)
Calcium: 9.5 mg/dL (ref 8.9–10.3)
Chloride: 103 mmol/L (ref 98–111)
Creatinine, Ser: 0.74 mg/dL (ref 0.44–1.00)
GFR, Estimated: >60 mL/min (ref >60)
Glucose, Bld: 98 mg/dL (ref 70–99)
Potassium: 4 mmol/L (ref 3.5–5.1)
Sodium: 136 mmol/L (ref 135–145)

## 2022-10-03 LAB — HCG, SERUM, QUALITATIVE: Preg, Serum: NEGATIVE

## 2022-10-03 LAB — TROPONIN I (HIGH SENSITIVITY): Troponin I (High Sensitivity): <2 ng/L (ref <18)

## 2022-10-03 NOTE — ED Triage Notes (Signed)
Pt arrives with reports of chest pain, emesis, rectal bleeding, and cough. Pt reports pain all over.

## 2022-12-14 ENCOUNTER — Encounter (HOSPITAL_COMMUNITY): Payer: Self-pay

## 2022-12-14 ENCOUNTER — Other Ambulatory Visit: Payer: Self-pay

## 2022-12-14 ENCOUNTER — Emergency Department (HOSPITAL_COMMUNITY): Payer: MEDICAID

## 2022-12-14 ENCOUNTER — Emergency Department (HOSPITAL_COMMUNITY)
Admission: EM | Admit: 2022-12-14 | Discharge: 2022-12-14 | Disposition: A | Discharge status: Home / Self Care | Payer: MEDICAID | Attending: Emergency Medicine | Admitting: Emergency Medicine

## 2022-12-14 DIAGNOSIS — N939 Abnormal uterine and vaginal bleeding, unspecified: Secondary | ICD-10-CM | POA: Diagnosis present

## 2022-12-14 DIAGNOSIS — R079 Chest pain, unspecified: Secondary | ICD-10-CM | POA: Diagnosis not present

## 2022-12-14 LAB — BASIC METABOLIC PANEL
Anion gap: 10 (ref 5–15)
BUN: 7 mg/dL (ref 6–20)
CO2: 24 mmol/L (ref 22–32)
Calcium: 9 mg/dL (ref 8.9–10.3)
Chloride: 104 mmol/L (ref 98–111)
Creatinine, Ser: 0.66 mg/dL (ref 0.44–1.00)
GFR, Estimated: >60 mL/min (ref >60)
Glucose, Bld: 88 mg/dL (ref 70–99)
Potassium: 3.7 mmol/L (ref 3.5–5.1)
Sodium: 138 mmol/L (ref 135–145)

## 2022-12-14 LAB — TROPONIN I (HIGH SENSITIVITY)
Troponin I (High Sensitivity): <2 ng/L (ref <18)
Troponin I (High Sensitivity): <2 ng/L (ref <18)

## 2022-12-14 LAB — WET PREP, GENITAL
Clue Cells Wet Prep HPF POC: NONE SEEN
Sperm: NONE SEEN
Trich, Wet Prep: NONE SEEN
WBC, Wet Prep HPF POC: <10 (ref <10)
Yeast Wet Prep HPF POC: NONE SEEN

## 2022-12-14 LAB — CBC
HCT: 44.6 % (ref 36.0–46.0)
Hemoglobin: 14.8 g/dL (ref 12.0–15.0)
MCH: 34.2 pg — ABNORMAL HIGH (ref 26.0–34.0)
MCHC: 33.2 g/dL (ref 30.0–36.0)
MCV: 103 fL — ABNORMAL HIGH (ref 80.0–100.0)
Platelets: 280 10*3/uL (ref 150–400)
RBC: 4.33 MIL/uL (ref 3.87–5.11)
RDW: 12.4 % (ref 11.5–15.5)
WBC: 10.9 10*3/uL — ABNORMAL HIGH (ref 4.0–10.5)
nRBC: 0 % (ref 0.0–0.2)

## 2022-12-14 LAB — HCG, SERUM, QUALITATIVE: Preg, Serum: NEGATIVE

## 2022-12-14 NOTE — ED Notes (Signed)
Pt aware of the need for urine 

## 2022-12-14 NOTE — ED Provider Notes (Signed)
[place] EMERGENCY DEPARTMENT AT [facility] Provider Note   CSN: [id number] Arrival date & time: 12/14/22  1204     History  No chief complaint on file.   Deborah Park is a 37 y.o. female who presents for " Stool coming out of my vagina."  She has a past medical history of major depressive disorder, psychosis, schizophrenia, polysubstance abuse.  Patient reports a 20 pound weight loss however review of EMR shows that the patient has been the same weight since April of this year.  Patient reports that her entire body is numb.  She states that she is incontinent of stool and has to wear a diaper and thinks that there is stool coming from her vagina.  She also reports irregular bleeding.  She occasionally has some sharp chest pain.  The patient thinks that there is a tear between her rectum and her vagina.  She denies any anal receptive intercourse.  Patient states she does not use IV drugs and does not have severe back pain.  She denies specific saddle anesthesia because her entire body is numb.  Review of EMR shows that the patient had a gynecologic exam on 7 August at which point she was not having significant incontinence.  She was given a referral to gynecology however has not yet followed with them.  HPI     Home Medications Prior to Admission medications   Not on File      Allergies    Penicillins    Review of Systems   Review of Systems  Physical Exam Updated Vital Signs BP 100/78 (BP Location: Right Arm)   Pulse 60   Temp 98.3 F (36.8 C) (Oral)   Resp 17   Wt 47.7 kg   SpO2 98%   BMI 18.64 kg/m  Physical Exam Vitals and nursing note reviewed.  Constitutional:      General: She is not in acute distress.    Appearance: She is underweight. She is not diaphoretic.  HENT:     Head: Normocephalic and atraumatic.     Right Ear: External ear normal.     Left Ear: External ear normal.     Nose: Nose normal.     Mouth/Throat:     Mouth: Mucous  membranes are moist.  Eyes:     General: No scleral icterus.    Conjunctiva/sclera: Conjunctivae normal.  Cardiovascular:     Rate and Rhythm: Normal rate and regular rhythm.     Heart sounds: Normal heart sounds. No murmur heard.    No friction rub. No gallop.  Pulmonary:     Effort: Pulmonary effort is normal. No respiratory distress.     Breath sounds: Normal breath sounds.  Abdominal:     General: Bowel sounds are normal. There is no distension.     Palpations: Abdomen is soft. There is no mass.     Tenderness: There is no abdominal tenderness. There is no guarding.  Genitourinary:    Comments: Pelvic exam: normal external genitalia, vulva, vagina, cervix, uterus and adnexa, VULVA: normal appearing vulva with no masses, tenderness or lesions, VAGINA: normal appearing vagina with normal color and discharge, no lesions, blood is present in the vaginal vault, no stool is noted, no obvious fistulas or lacerations, CERVIX: Normal appearing cervix with dark blood from the os consistent with menstrual bleeding, UTERUS: uterus is normal size, shape, consistency and nontender, ADNEXA: normal adnexa in size, nontender and no masses, RECTAL: normal rectal, no masses or fistula  no obvious fecal incontinence, normal rectal tone, exam chaperoned by Acquanetta Chain  Musculoskeletal:     Cervical back: Normal range of motion.  Skin:    General: Skin is warm and dry.  Neurological:     Mental Status: She is alert and oriented to person, place, and time.  Psychiatric:        Behavior: Behavior normal.     ED Results / Procedures / Treatments   Labs (all labs ordered are listed, but only abnormal results are displayed) Labs Reviewed  CBC - Abnormal; Notable for the following components:      Result Value   WBC 10.9 (*)    MCV 103.0 (*)    MCH 34.2 (*)    All other components within normal limits  WET PREP, GENITAL  BASIC METABOLIC PANEL  HCG, SERUM, QUALITATIVE  GC/CHLAMYDIA PROBE AMP ([facility]) NOT AT [facility]  TROPONIN I (HIGH SENSITIVITY)  TROPONIN I (HIGH SENSITIVITY)    EKG EKG Interpretation Date/Time:  Thursday December 14 2022 12:37:01 EDT Ventricular Rate:  74 PR Interval:  168 QRS Duration:  84 QT Interval:  394 QTC Calculation: 437 R Axis:   95  Text Interpretation: Normal sinus rhythm Right atrial enlargement Rightward axis Cannot rule out Anterior infarct , age undetermined Abnormal ECG When compared with ECG of 03-Oct-2022 19:35, PREVIOUS ECG IS PRESENT no change Confirmed by Arby Barrette [phone]) on 12/15/2022 2:58:52 PM  Radiology No results found.  Procedures Procedures    Medications Ordered in ED Medications - No data to display  ED Course/ Medical Decision Making/ A&P Clinical Course as of 12/17/22 1554  Thu Dec 14, 2022  1325 Weight: per chart  07/04/22- 45.4 kg 11/08/2022- 48.3 kg  [AH]  1354 MCV(!): 103.0 [AH]  1727 DG Chest Portable 1 View [AH]  1728 CBC(!) [AH]  1728 hCG, serum, qualitative [AH]  1728 Wet prep, genital [AH]  1728 Troponin I (High Sensitivity) [AH]  1728 Basic metabolic panel [AH]    Clinical Course User Index [AH] Arthor Captain, PA-C                                 Medical Decision Making Patient here with complaint of stool leaking from her vagina, abnormal bleeding, weight loss. Differential diagnosis for nonpregnant vaginal bleeding includes but is not limited to systemic causes such as cirrhosis of the liver, coagulopathy such as ITP or von Willebrand's disease, strep vaginitis, HRT, hypothyroidism, secondary anovulation.  Reproductive tract causes include adenomyosis, atrophic endometrium, dysfunctional uterine bleeding, endometriosis, fibroids, foreign body, infection, IUD, neoplasia or vaginal trauma.   After review of EMR patient has had no weight loss for the past several months. On physical examination she has no stool leaking from her bottom and no stool leaking from her vagina. She is having sharp  intermittent chest pain but has none today.  Troponins ordered at triage.  I ordered labs including GC chlamydia wet prep CBC and BMP. Patient's CBC shows mildly elevated white blood count of insignificant value.  Hemoglobin is normal.  BMP shows no abnormalities.  Patient's hCG is negative for pregnancy.  She has 2 negative troponins.  Wet prep shows no abnormalities.  After evaluation of the patient I suspect her current symptoms are more likely related to her schizophrenia she does not appear to have a fistula or stool leaking from her bottom.  She does have some uterine bleeding which may  be abnormal or related to her normal menstrual cycle.  She has had no weight loss for the past 6 months.\ Again I suspect this is more likely delusional thought.  Will refer patient to outpatient gynecology for further assessment.  She is hemodynamically stable and does not appear to have any evidence of infection.  Amount and/or Complexity of Data Reviewed Labs: ordered. Decision-making details documented in ED Course. Radiology:  Decision-making details documented in ED Course.           Final Clinical Impression(s) / ED Diagnoses Final diagnoses:  Abnormal uterine bleeding (AUB)    Rx / DC Orders ED Discharge Orders     None         Arthor Captain, PA-C 12/17/22 1554    Royanne Foots, DO 12/21/22 1643

## 2022-12-14 NOTE — ED Triage Notes (Signed)
Pt c/o vaginal and rectal bleeding and incontinence x 3 weeks ; states she is finding fecal matter with urination; LMP 8/20; had similar episode last November; endorses difficulty walking, sometimes drags L leg; endorses 20 lb weight loss in last 3 weeks, loss of appetite; denies pain currently, endorses intermittent chest pain

## 2022-12-14 NOTE — Discharge Instructions (Signed)
Contact a health care provider if:  You have bleeding that lasts for more than one week.  You feel dizzy at times.  You feel nauseous or you vomit.  You feel light-headed or weak.  You notice any other changes that show that your condition is getting worse.  Get help right away if:  You faint.  You have bleeding that soaks through a sanitary pad every hour.  You have pain in the abdomen.  You have a fever or chills.  You become sweaty or weak.  You pass large blood clots from your vagina.  These symptoms may represent a serious problem that is an emergency. Do not wait to see if the symptoms will go away. Get medical help right away. Call your local emergency services (911 in the [place] Do not drive yourself to the hospital.

## 2022-12-15 LAB — GC/CHLAMYDIA PROBE AMP (~~LOC~~) NOT AT ARMC
Chlamydia: NEGATIVE
Comment: NEGATIVE
Comment: NORMAL
Neisseria Gonorrhea: NEGATIVE

## 2023-02-21 IMAGING — CR DG NECK SOFT TISSUE
2 series · 2 of 2 positions shown · non-contrast
Comparison: None.

CLINICAL DATA: Neck pain.

EXAM:
NECK SOFT TISSUES - 1+ VIEW

[w soft tissue neck lat (1 of 2)]
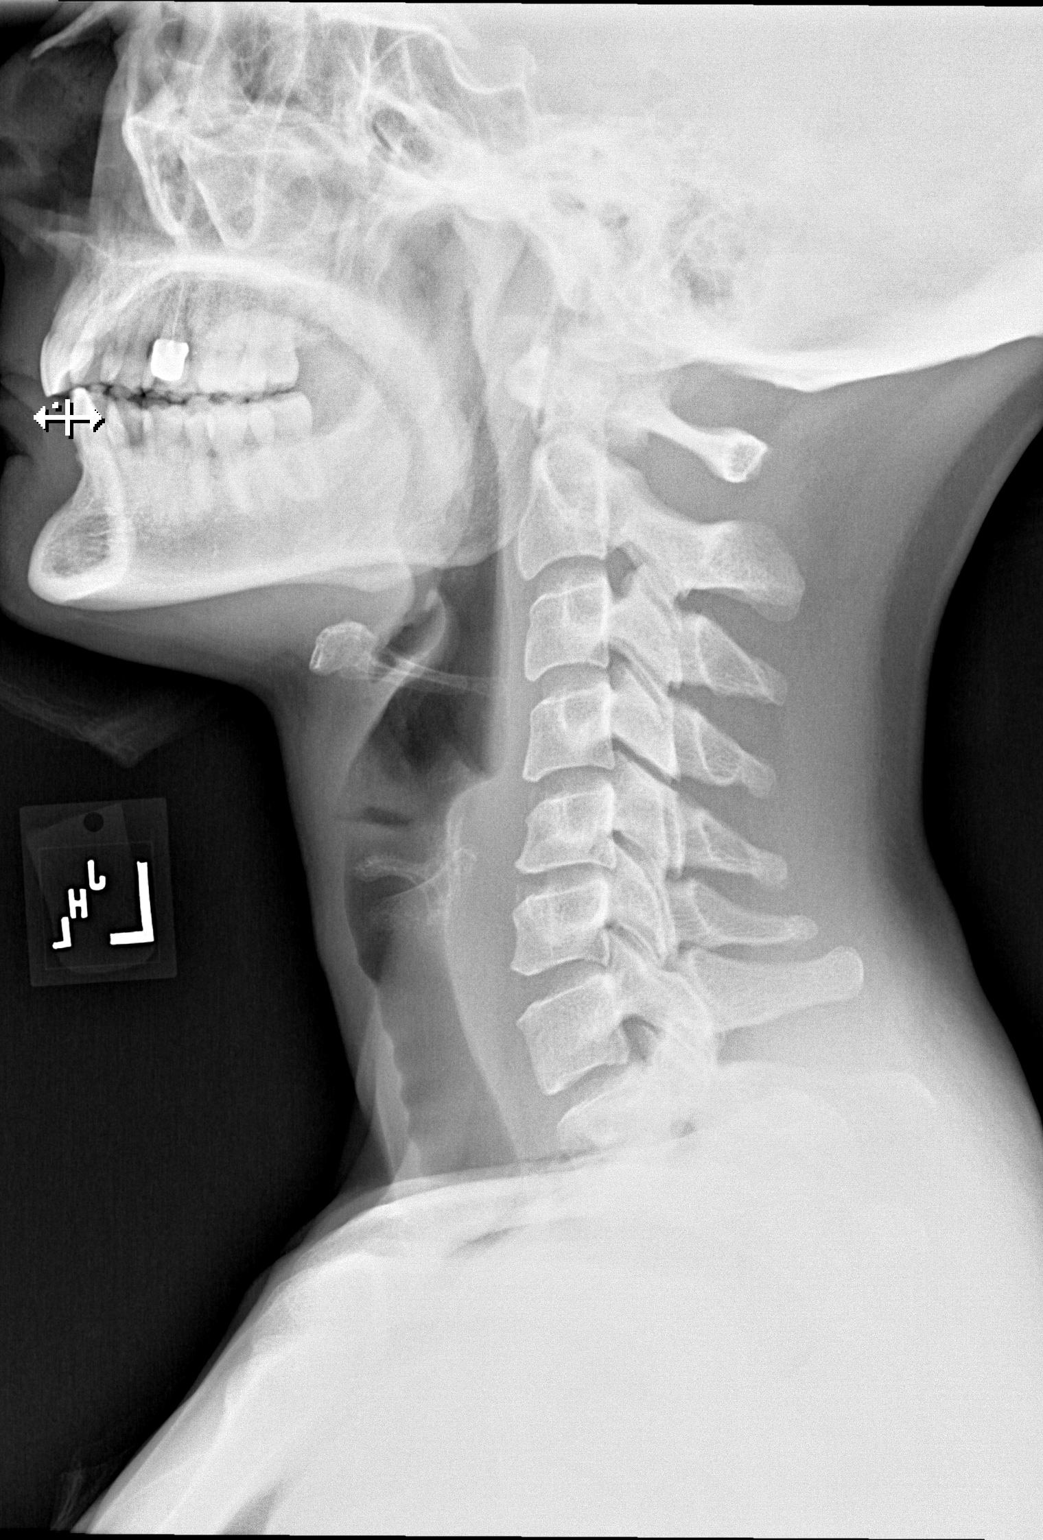

[w soft tissue neck lat (2 of 2)]
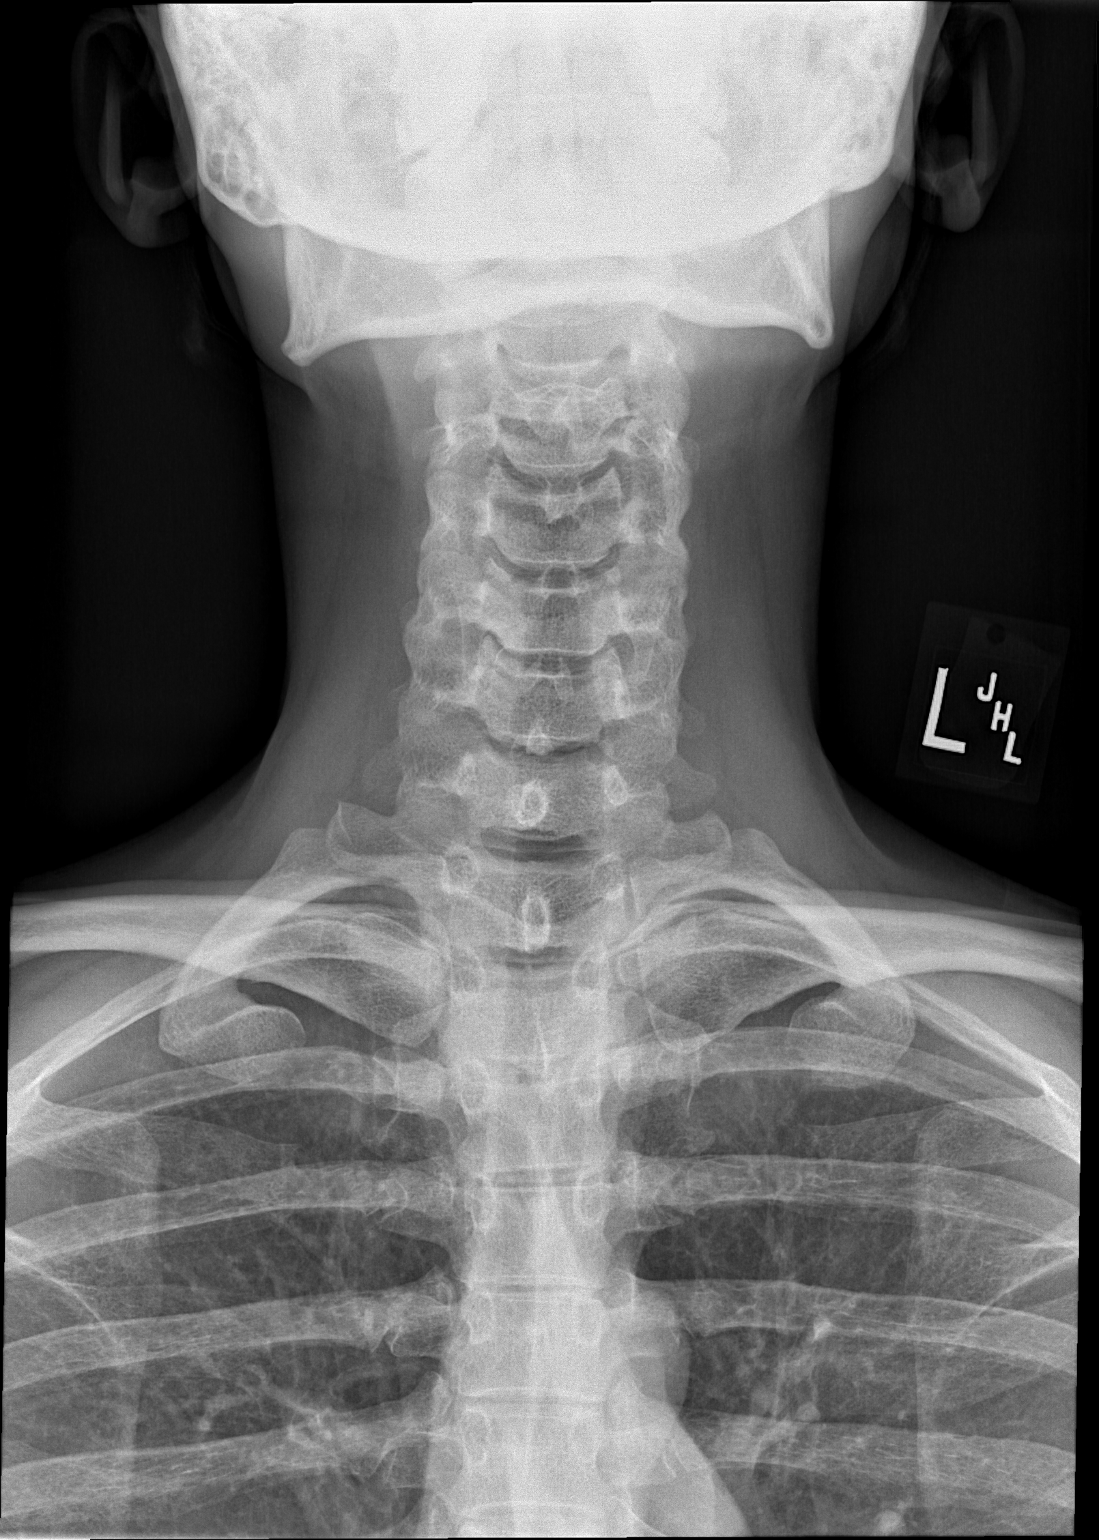

[2 of 2 positions shown; findings below may reference images not displayed]

FINDINGS: There is no evidence of retropharyngeal soft tissue swelling or
epiglottic enlargement. The cervical airway is unremarkable and no
radio-opaque foreign body identified.
IMPRESSION: Negative.

## 2023-05-01 ENCOUNTER — Telehealth: Payer: MEDICAID | Admitting: Physician Assistant

## 2023-05-01 DIAGNOSIS — R55 Syncope and collapse: Secondary | ICD-10-CM

## 2023-05-01 DIAGNOSIS — R42 Dizziness and giddiness: Secondary | ICD-10-CM

## 2023-05-01 NOTE — Progress Notes (Signed)
Virtual Visit Consent   Deborah Park, you are scheduled for a virtual visit with a [facility] provider today. Just as with appointments in the office, your consent must be obtained to participate. Your consent will be active for this visit and any virtual visit you may have with one of our providers in the next 365 days. If you have a MyChart account, a copy of this consent can be sent to you electronically.  As this is a virtual visit, video technology does not allow for your provider to perform a traditional examination. This may limit your provider's ability to fully assess your condition. If your provider identifies any concerns that need to be evaluated in person or the need to arrange testing (such as labs, EKG, etc.), we will make arrangements to do so. Although advances in technology are sophisticated, we cannot ensure that it will always work on either your end or our end. If the connection with a video visit is poor, the visit may have to be switched to a telephone visit. With either a video or telephone visit, we are not always able to ensure that we have a secure connection.  By engaging in this virtual visit, you consent to the provision of healthcare and authorize for your insurance to be billed (if applicable) for the services provided during this visit. Depending on your insurance coverage, you may receive a charge related to this service.  I need to obtain your verbal consent now. Are you willing to proceed with your visit today? Deborah Park has provided verbal consent on 05/01/2023 for a virtual visit (video or telephone). [name], [place]  Date: 05/01/2023 8:56 AM  Virtual Visit via Video Note   I, [name], connected with  Deborah Park  ([id number], [birth year]) on 05/01/23 at  8:30 AM EST by a video-enabled telemedicine application and verified that I am speaking with the correct person using two identifiers.  Location: Patient:  Virtual Visit Location Patient: Home Provider: Virtual Visit Location Provider: Home Office   I discussed the limitations of evaluation and management by telemedicine and the availability of in person appointments. The patient expressed understanding and agreed to proceed.    History of Present Illness: Deborah Park is a 38 y.o. who identifies as a female who was assigned female at birth, and is being seen today for dizziness and feeling off-balance. Deep breathing exercises for anxiety. During episode of holding breathe during her deep breathing exercise she got very faint/lightheaded and flushed feeling. Notes she caught herself to keep from falling. Did drop objects in her hands, but no injury from that. Notes feeling some chest tightness with this and some residual soreness. Has had ongoing episodes of dizziness and feeling off-balance since then. Also some ongoing swallowing difficulty that she is seeing an ENT for tomorrow. No change in this since incident yesterday.  Denies any AMS, vision changes. Some issues focusing but feels this is not acute and has been going on with her chronic depression.   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  Observations/Objective: Patient is well-developed, well-nourished in no acute distress.  Resting comfortably at home.  Head is normocephalic, atraumatic.  No labored breathing. Speech is clear and coherent with logical content.  Patient is alert and oriented at baseline.   Assessment and Plan: 1. Dizziness (Primary)  2. Pre-syncope  Episode yesterday with some residual feeling of mild dizziness/lightheadedness. No AMS or vision changes. Has already scheduled appt with ENT tomorrow but with residual symptoms, want her to be examined today to make sure nothing else is needed more acutely. She agrees to be evaluated at in person UC this morning.   Follow Up Instructions: I discussed the assessment and treatment plan with the patient. The patient was provided an opportunity to ask questions and all were answered. The patient agreed with the plan and demonstrated an understanding of the instructions.  A copy of instructions were sent to the patient via MyChart unless otherwise noted below.   The patient was advised to call back or seek an in-person evaluation if the symptoms worsen or if the condition fails to improve as anticipated.    [name], PA-C

## 2023-05-01 NOTE — Patient Instructions (Signed)
  Deborah Park, thank you for joining [name], PA-C for today's virtual visit.  While this provider is not your primary care provider (PCP), if your PCP is located in our provider database this encounter information will be shared with them immediately following your visit.   A [facility] MyChart account gives you access to today's visit and all your visits, tests, and labs performed at [facility] " click here if you don't have a [facility] MyChart account or go to [email or web address]  Consent: (Patient) Catering manager provided verbal consent for this virtual visit at the beginning of the encounter.  Current Medications: No current outpatient medications on file.   Medications ordered in this encounter:  No orders of the defined types were placed in this encounter.    *If you need refills on other medications prior to your next appointment, please contact your pharmacy*  Follow-Up: Call back or seek an in-person evaluation if the symptoms worsen or if the condition fails to improve as anticipated.  [facility] [phone]  Other Instructions  If you have been instructed to have an in-person evaluation today at a local Urgent Care facility, please use the link below. It will take you to a list of all of our available [facility], including address, phone number and hours of operation. Please do not delay care.  [facility]    Learn more about [facility] in-office and virtual care options:  - Get Care Now

## 2023-05-02 ENCOUNTER — Encounter (HOSPITAL_COMMUNITY): Payer: Self-pay

## 2023-05-02 ENCOUNTER — Ambulatory Visit (HOSPITAL_COMMUNITY)
Admission: RE | Admit: 2023-05-02 | Discharge: 2023-05-02 | Disposition: A | Discharge status: Home / Self Care | Payer: Self-pay | Source: Ambulatory Visit

## 2023-05-02 ENCOUNTER — Institutional Professional Consult (permissible substitution) (INDEPENDENT_AMBULATORY_CARE_PROVIDER_SITE_OTHER): Payer: MEDICAID | Admitting: Otolaryngology

## 2023-05-02 VITALS — BP 110/71 | HR 75 | Temp 98.3°F | Resp 16 | Ht 67.0 in | Wt 110.0 lb

## 2023-05-02 DIAGNOSIS — R42 Dizziness and giddiness: Secondary | ICD-10-CM | POA: Diagnosis not present

## 2023-05-02 DIAGNOSIS — R09A2 Foreign body sensation, throat: Secondary | ICD-10-CM | POA: Diagnosis not present

## 2023-05-02 DIAGNOSIS — R0981 Nasal congestion: Secondary | ICD-10-CM

## 2023-05-02 NOTE — Discharge Instructions (Signed)
Please follow-up with ENT as scheduled.  Return to clinic for any new or urgent symptoms.

## 2023-05-02 NOTE — ED Triage Notes (Signed)
Patient c/o neck discomfort, difficulty swallowing, worse when laying down x 5 months.  Patient has an appt w/her PCP tomorrow.  Patient is able to eat and drink w/o difficulty.  Some dysaphia.

## 2023-05-02 NOTE — ED Provider Notes (Signed)
[facility]    CSN: [id number] Arrival date & time: 05/02/23  4403      History   Chief Complaint Chief Complaint  Patient presents with   Neck Injury    Breathing difficulty; gagging or choking and difficulty swallowing - Entered by patient    HPI Deborah Park is a 38 y.o. female.   Patient presents to clinic with a globulus sensation with swallowing that has been present for at least the past 6 months.  She has also been congested since December with rhinorrhea.  Has not tried any interventions for this.  Has been having intermittent dizziness as well, this appears to be an ongoing issue since last year.  Reports she did lose consciousness, but did not blackout on Monday.  Dropped some objects from her hand but did not fall.  Has not had anything to eat or drink today.  Is not currently dizzy.  Physic she is having trouble swallowing solid foods.  Reports trouble swallowing and the foreign body sensation make her feel like there is an issue with her nose.  She does have an ENT appointment this afternoon.  Was evaluated by telehealth yesterday and advised to come into the urgent care for further evaluation.  She presents today requesting an x-ray.  History of hypertension, anxiety, dry malformation, memory loss, mental disorder, sleep disturbance, and polysubstance abuse.  The history is provided by the patient and medical records.  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[facility])    Supervision of high risk  pregnancy, antepartum 05/08/2019   History of preterm delivery, currently pregnant 05/08/2019   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   MDD (major depressive disorder), recurrent severe, without psychosis (HCC) 11/04/2018   Psychoactive substance-induced psychosis (HCC)    MDD (major depressive disorder), single episode, severe with psychosis (HCC) 08/29/2018   Polysubstance abuse (HCC) 02/14/2018   Memory loss 09/17/2013   Sleep disturbance 09/17/2013    Past Surgical History:  Procedure Laterality Date   KIDNEY SURGERY     MOUTH SURGERY      OB History     Gravida  4   Para  3   Term  1   Preterm  2   AB  0   Living  2      SAB  0   IAB  0   Ectopic  0   Multiple      Live Births  2            Home Medications    Prior to Admission medications   Not on File    Family History Family History  Problem Relation Age of Onset   Healthy Mother    Healthy Father    Bipolar disorder Maternal Grandmother    Seizures Neg Hx    Parkinsonism Neg Hx    Multiple sclerosis Neg Hx    Dementia Neg Hx     Social History Social History   Tobacco Use  Smoking status: Never   Smokeless tobacco: Never  Vaping Use   Vaping status: Never Used  Substance Use Topics   Alcohol use: Never   Drug use: Never    Frequency: 1.0 times per week    Types: Cocaine     Allergies   Penicillins   Review of Systems Review of Systems  Per HPI   Physical Exam Triage Vital Signs ED Triage Vitals  Encounter Vitals Group     BP 05/02/23 0938 110/71     Systolic BP Percentile --      Diastolic BP Percentile --      Pulse Rate 05/02/23 0938 75     Resp 05/02/23 0938 16     Temp 05/02/23 0938 98.3 F (36.8 C)     Temp Source 05/02/23 0938 Oral     SpO2 05/02/23 0938 97 %     Weight 05/02/23 0940 110 lb (49.9 kg)     Height 05/02/23 0940 5\' 7"  (1.702 m)     Head Circumference --      Peak Flow --      Pain Score 05/02/23 0940 0     Pain Loc --      Pain  Education --      Exclude from Growth Chart --    No data found.  Updated Vital Signs BP 110/71 (BP Location: Left Arm)   Pulse 75   Temp 98.3 F (36.8 C) (Oral)   Resp 16   Ht 5\' 7"  (1.702 m)   Wt 110 lb (49.9 kg)   LMP 03/26/2023   SpO2 97%   Breastfeeding No   BMI 17.23 kg/m   Visual Acuity Right Eye Distance:   Left Eye Distance:   Bilateral Distance:    Right Eye Near:   Left Eye Near:    Bilateral Near:     Physical Exam Vitals and nursing note reviewed.  Constitutional:      Appearance: Normal appearance.  HENT:     Head: Normocephalic and atraumatic.     Right Ear: External ear normal.     Left Ear: External ear normal.     Nose: Congestion present.     Mouth/Throat:     Mouth: Mucous membranes are moist.  Eyes:     Conjunctiva/sclera: Conjunctivae normal.  Cardiovascular:     Rate and Rhythm: Normal rate and regular rhythm.     Heart sounds: Normal heart sounds. No murmur heard. Pulmonary:     Effort: Pulmonary effort is normal. No respiratory distress.     Breath sounds: Normal breath sounds.  Musculoskeletal:     Cervical back: Normal range of motion and neck supple.  Lymphadenopathy:     Cervical: No cervical adenopathy.  Skin:    General: Skin is warm and dry.  Neurological:     General: No focal deficit present.     Mental Status: She is alert and oriented to person, place, and time.  Psychiatric:        Behavior: Behavior is agitated.      UC Treatments / Results  Labs (all labs ordered are listed, but only abnormal results are displayed) Labs Reviewed - No data to display  EKG   Radiology No results found.  Procedures Procedures (including critical care time)  Medications Ordered in UC Medications - No data to display  Initial Impression / Assessment and Plan / UC Course  I have reviewed the triage vital signs and the nursing notes.  Pertinent labs &  imaging results that were available during my care of the patient were  reviewed by me and considered in my medical decision making (see chart for details).  Vitals and triage reviewed, patient is hemodynamically stable.  Globulus sensation with swallowing for the past 6 months.  Feels like it is recently gotten worse.  Trachea midline, swallowing and speaking without difficulty in clinic, oxygenation 97% on room air in no acute distress.  No cervical LAD. Some congestion present on physical exam.  Lungs are vesicular, heart with regular rate and rhythm.  Discussed ENT would be the best benefit for the patient.  Do not feel as x-ray is indicated as this appears to be soft tissue in nature.  Offered CBG check for dizziness, patient declined.  She is agitated and upset today in clinic.  Discussed that she was advised to follow-up yesterday for further evaluation there is no mention of any x-ray in the telehealth note.  Encouraged ENT follow-up for further evaluation.  Patient verbalized understanding, no questions at this time.     Final Clinical Impressions(s) / UC Diagnoses   Final diagnoses:  Globus sensation  Nasal congestion  Dizziness     Discharge Instructions      Please follow-up with ENT as scheduled.  Return to clinic for any new or urgent symptoms.     ED Prescriptions   None    PDMP not reviewed this encounter.   Mali Eppard, [place], [place] 05/02/23 1004

## 2023-05-16 ENCOUNTER — Telehealth: Payer: MEDICAID | Admitting: Physician Assistant

## 2023-05-16 DIAGNOSIS — H1032 Unspecified acute conjunctivitis, left eye: Secondary | ICD-10-CM | POA: Diagnosis not present

## 2023-05-16 MED ORDER — OFLOXACIN 0.3 % OP SOLN: 1.0000 [drp] | Freq: Four times a day (QID) | OPHTHALMIC | 0 refills | Status: AC

## 2023-05-16 NOTE — Progress Notes (Signed)
Virtual Visit Consent   Deborah Park, you are scheduled for a virtual visit with a [facility] provider today. Just as with appointments in the office, your consent must be obtained to participate. Your consent will be active for this visit and any virtual visit you may have with one of our providers in the next 365 days. If you have a MyChart account, a copy of this consent can be sent to you electronically.  As this is a virtual visit, video technology does not allow for your provider to perform a traditional examination. This may limit your provider's ability to fully assess your condition. If your provider identifies any concerns that need to be evaluated in person or the need to arrange testing (such as labs, EKG, etc.), we will make arrangements to do so. Although advances in technology are sophisticated, we cannot ensure that it will always work on either your end or our end. If the connection with a video visit is poor, the visit may have to be switched to a telephone visit. With either a video or telephone visit, we are not always able to ensure that we have a secure connection.  By engaging in this virtual visit, you consent to the provision of healthcare and authorize for your insurance to be billed (if applicable) for the services provided during this visit. Depending on your insurance coverage, you may receive a charge related to this service.  I need to obtain your verbal consent now. Are you willing to proceed with your visit today? Deborah Park has provided verbal consent on 05/16/2023 for a virtual visit (video or telephone). Roney Jaffe, PA-C  Date: 05/16/2023 9:34 PM   Virtual Visit via Video Note   I, Deborah Park, connected with  Deborah Park  ([id number], 06/23/1985) on 05/16/23 at  9:30 PM EST by a video-enabled telemedicine application and verified that I am speaking with the correct person using two identifiers.  Location: Patient: Virtual Visit  Location Patient: Home Provider: Virtual Visit Location Provider: Home Office   I discussed the limitations of evaluation and management by telemedicine and the availability of in person appointments. The patient expressed understanding and agreed to proceed.    History of Present Illness: Deborah Park is a 38 y.o. who identifies as a female who was assigned female at birth,  presents with a two-day history of left eye redness and inflammation, particularly upon waking. She denies ocular discharge or matting, and does not believe there is a foreign body in the eye. She has tried Visine eye drops without relief of redness or itchiness. Interestingly, she found temporary relief of itchiness with vinegar, despite the associated discomfort. She reports concurrent nasal drainage, but denies recent illness or exposure to others with similar symptoms. Her son has a cough, but no ocular symptoms.    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   Psychoactive substance-induced psychosis (HCC)    MDD (major depressive disorder), single episode, severe with psychosis (HCC) 08/29/2018  Polysubstance abuse (HCC) 02/14/2018   Memory loss 09/17/2013   Sleep disturbance 09/17/2013    Allergies:  Allergies  Allergen Reactions   Penicillins Other (See Comments)    Unknown reaction     Medications:  Current Outpatient Medications:    ofloxacin (OCUFLOX) 0.3 % ophthalmic solution,  Place 1 drop into the left eye 4 (four) times daily for 7 days., Disp: 5 mL, Rfl: 0  Observations/Objective: Patient is well-developed, well-nourished in no acute distress.  Resting comfortably  at home.  Head is normocephalic, atraumatic.  No labored breathing.  Speech is clear and coherent with logical content.  Patient is alert and oriented at baseline.    Assessment and Plan: 1. Acute conjunctivitis of left eye, unspecified acute conjunctivitis type (Primary) - ofloxacin (OCUFLOX) 0.3 % ophthalmic solution; Place 1 drop into the left eye 4 (four) times daily for 7 days.  Dispense: 5 mL; Refill: 0    Left eye  redness and inflammation. No drainage or foreign body sensation. Over-the-counter eye drops and vinegar have not improved symptoms. -Prescribe eye drops for conjunctivitis.  Follow Up Instructions: I discussed the assessment and treatment plan with the patient. The patient was provided an opportunity to ask questions and all were answered. The patient agreed with the plan and demonstrated an understanding of the instructions.  A copy of instructions were sent to the patient via MyChart unless otherwise noted below.     The patient was advised to call back or seek an in-person evaluation if the symptoms worsen or if the condition fails to improve as anticipated.    Kasandra Knudsen Mayers, PA-C

## 2023-05-16 NOTE — Patient Instructions (Signed)
Deborah Park, thank you for joining [employer], PA-C for today's virtual visit.  While this provider is not your primary care provider (PCP), if your PCP is located in our provider database this encounter information will be shared with them immediately following your visit.   A [facility] MyChart account gives you access to today's visit and all your visits, tests, and labs performed at [facility] " click here if you don't have a [facility] MyChart account or go to [email or web address]  Consent: (Patient) Catering manager provided verbal consent for this virtual visit at the beginning of the encounter.  Current Medications:  Current Outpatient Medications:    ofloxacin (OCUFLOX) 0.3 % ophthalmic solution, Place 1 drop into the left eye 4 (four) times daily for 7 days., Disp: 5 mL, Rfl: 0   Medications ordered in this encounter:  Meds ordered this encounter  Medications   ofloxacin (OCUFLOX) 0.3 % ophthalmic solution    Sig: Place 1 drop into the left eye 4 (four) times daily for 7 days.    Dispense:  5 mL    Refill:  0    Supervising Provider:   Merrilee Jansky [[id number]]     *If you need refills on other medications prior to your next appointment, please contact your pharmacy*  Follow-Up: Call back or seek an in-person evaluation if the symptoms worsen or if the condition fails to improve as anticipated.  [facility] [phone]  Other Instructions Bacterial Conjunctivitis, Adult Bacterial conjunctivitis is an infection of the clear membrane that covers the white part of the eye and the inner surface of the eyelid (conjunctiva). When the blood vessels in the conjunctiva become inflamed, the eye becomes red or pink. The eye often feels irritated or itchy. Bacterial conjunctivitis spreads easily from person to person (is contagious). It also spreads easily from one eye to the other eye. What are the causes? This condition is  caused by bacteria. You may get the infection if you come into close contact with: A person who is infected with the bacteria. Items that are contaminated with the bacteria, such as a face towel, contact lens solution, or eye makeup. What increases the risk? You are more likely to develop this condition if: You are exposed to other people who have the infection. You wear contact lenses. You have a sinus infection. You have had a recent eye injury or surgery. You have a weak body defense system (immune system). You have a medical condition that causes dry eyes. What are the signs or symptoms? Symptoms of this condition include: Thick, yellowish discharge from the eye. This may turn into a crust on the eyelid overnight and cause your eyelids to stick together. Tearing or watery eyes. Itchy eyes. Burning feeling in your eyes. Eye redness. Swollen eyelids. Blurred vision. How is this diagnosed? This condition is diagnosed based on your symptoms and medical history. Your health care provider may also take a sample of discharge from your eye to find the cause of your infection. How is this treated? This condition may be treated with: Antibiotic eye drops or ointment to clear the infection more quickly and prevent the spread of infection to others. Antibiotic medicines taken by mouth (orally) to treat infections that do not respond to drops or ointments or that last longer than 10 days. Cool, wet cloths (cool compresses) placed on the eyes. Artificial tears applied 2-6 times a day. Follow these instructions at home: Medicines Take or  apply your antibiotic medicine as told by your health care provider. Do not stop using the antibiotic, even if your condition improves, unless directed by your health care provider. Take or apply over-the-counter and prescription medicines only as told by your health care provider. Be very careful to avoid touching the edge of your eyelid with the eye-drop  bottle or the ointment tube when you apply medicines to the affected eye. This will keep you from spreading the infection to your other eye or to other people. Managing discomfort Gently wipe away any drainage from your eye with a warm, wet washcloth or a cotton ball. Apply a clean, cool compress to your eye for 10-20 minutes, 3-4 times a day. General instructions Do not wear contact lenses until the inflammation is gone and your health care provider says it is safe to wear them again. Ask your health care provider how to sterilize or replace your contact lenses before you use them again. Wear glasses until you can resume wearing contact lenses. Avoid wearing eye makeup until the inflammation is gone. Throw away any old eye cosmetics that may be contaminated. Change or wash your pillowcase every day. Do not share towels or washcloths. This may spread the infection. Wash your hands often with soap and water for at least 20 seconds and especially before touching your face or eyes. Use paper towels to dry your hands. Avoid touching or rubbing your eyes. Do not drive or use heavy machinery if your vision is blurred. Contact a health care provider if: You have a fever. Your symptoms do not get better after 10 days. Get help right away if: You have a fever and your symptoms suddenly get worse. You have severe pain when you move your eye. You have facial pain, redness, or swelling. You have a sudden loss of vision. Summary Bacterial conjunctivitis is an infection of the clear membrane that covers the white part of the eye and the inner surface of the eyelid (conjunctiva). Bacterial conjunctivitis spreads easily from eye to eye and from person to person (is contagious). Wash your hands often with soap and water for at least 20 seconds and especially before touching your face or eyes. Use paper towels to dry your hands. Take or apply your antibiotic medicine as told by your health care provider. Do  not stop using the antibiotic even if your condition improves. Contact a health care provider if you have a fever or if your symptoms do not get better after 10 days. Get help right away if you have a sudden loss of vision. This information is not intended to replace advice given to you by your health care provider. Make sure you discuss any questions you have with your health care provider. Document Revised: 06/30/2020 Document Reviewed: 06/30/2020 Elsevier Patient Education  2024 Elsevier Inc.   If you have been instructed to have an in-person evaluation today at a local Urgent Care facility, please use the link below. It will take you to a list of all of our available [facility], including address, phone number and hours of operation. Please do not delay care.  [facility]  If you or a family member do not have a primary care provider, use the link below to schedule a visit and establish care. When you choose a [facility] primary care physician or advanced practice provider, you gain a long-term partner in health. Find a Primary Care Provider  Learn more about [facility]'s in-office and virtual  care options: [facility] Get Care Now

## 2023-05-30 ENCOUNTER — Other Ambulatory Visit: Payer: Self-pay

## 2023-05-30 ENCOUNTER — Emergency Department (HOSPITAL_COMMUNITY)
Admission: EM | Admit: 2023-05-30 | Discharge: 2023-05-30 | Disposition: A | Discharge status: Home / Self Care | Payer: MEDICAID | Attending: Emergency Medicine | Admitting: Emergency Medicine

## 2023-05-30 ENCOUNTER — Encounter (HOSPITAL_COMMUNITY): Payer: Self-pay | Admitting: Emergency Medicine

## 2023-05-30 DIAGNOSIS — I10 Essential (primary) hypertension: Secondary | ICD-10-CM | POA: Insufficient documentation

## 2023-05-30 DIAGNOSIS — R451 Restlessness and agitation: Secondary | ICD-10-CM | POA: Insufficient documentation

## 2023-05-30 DIAGNOSIS — R4689 Other symptoms and signs involving appearance and behavior: Secondary | ICD-10-CM | POA: Diagnosis present

## 2023-05-30 DIAGNOSIS — D72829 Elevated white blood cell count, unspecified: Secondary | ICD-10-CM | POA: Insufficient documentation

## 2023-05-30 LAB — CBC WITH DIFFERENTIAL/PLATELET
Abs Immature Granulocytes: 0.09 10*3/uL — ABNORMAL HIGH (ref 0.00–0.07)
Basophils Absolute: 0.1 10*3/uL (ref 0.0–0.1)
Basophils Relative: 0 %
Eosinophils Absolute: 0 10*3/uL (ref 0.0–0.5)
Eosinophils Relative: 0 %
HCT: 39.1 % (ref 36.0–46.0)
Hemoglobin: 13.2 g/dL (ref 12.0–15.0)
Immature Granulocytes: 1 %
Lymphocytes Relative: 7 %
Lymphs Abs: 1.2 10*3/uL (ref 0.7–4.0)
MCH: 33.7 pg (ref 26.0–34.0)
MCHC: 33.8 g/dL (ref 30.0–36.0)
MCV: 99.7 fL (ref 80.0–100.0)
Monocytes Absolute: 1.1 10*3/uL — ABNORMAL HIGH (ref 0.1–1.0)
Monocytes Relative: 6 %
Neutro Abs: 15.5 10*3/uL — ABNORMAL HIGH (ref 1.7–7.7)
Neutrophils Relative %: 86 %
Platelets: 294 10*3/uL (ref 150–400)
RBC: 3.92 MIL/uL (ref 3.87–5.11)
RDW: 12.1 % (ref 11.5–15.5)
WBC: 17.9 10*3/uL — ABNORMAL HIGH (ref 4.0–10.5)
nRBC: 0 % (ref 0.0–0.2)

## 2023-05-30 LAB — COMPREHENSIVE METABOLIC PANEL
ALT: 23 U/L (ref 0–44)
AST: 32 U/L (ref 15–41)
Albumin: 3.7 g/dL (ref 3.5–5.0)
Alkaline Phosphatase: 45 U/L (ref 38–126)
Anion gap: 13 (ref 5–15)
BUN: 13 mg/dL (ref 6–20)
CO2: 20 mmol/L — ABNORMAL LOW (ref 22–32)
Calcium: 8.5 mg/dL — ABNORMAL LOW (ref 8.9–10.3)
Chloride: 98 mmol/L (ref 98–111)
Creatinine, Ser: 0.8 mg/dL (ref 0.44–1.00)
GFR, Estimated: >60 mL/min (ref >60)
Glucose, Bld: 107 mg/dL — ABNORMAL HIGH (ref 70–99)
Potassium: 3.7 mmol/L (ref 3.5–5.1)
Sodium: 131 mmol/L — ABNORMAL LOW (ref 135–145)
Total Bilirubin: 0.6 mg/dL (ref 0.0–1.2)
Total Protein: 5.8 g/dL — ABNORMAL LOW (ref 6.5–8.1)

## 2023-05-30 LAB — HCG, SERUM, QUALITATIVE: Preg, Serum: NEGATIVE

## 2023-05-30 LAB — ETHANOL: Alcohol, Ethyl (B): <10 mg/dL (ref <10)

## 2023-05-30 NOTE — ED Notes (Signed)
 Attempted to obtain VS and her EKG pt would not respond to this writer and just covered her head with her blanket.

## 2023-05-30 NOTE — ED Provider Notes (Signed)
 [facility] EMERGENCY DEPARTMENT AT [facility] Provider Note  CSN: [id number] Arrival date & time: 05/30/23 0139  Chief Complaint(s) Aggressive Behavior  BIBA  Per EMS: pt coming from home w/ c/o behavioral issues. Pt acting aggressive due to argument w/ boyfriend.  5mg  versed given en route  5mg  haldol given en route  In restraints upon arrival. Pt taken out of restraints upon moving to ED bed.  Pt asleep & calm in bed at this time.   HPI Deborah Park is a 38 y.o. female brought in by EMS for agitation requiring chemical restraints. Patient not answering questions.   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[facility])    Supervision of high risk pregnancy, antepartum 05/08/2019   History of preterm delivery, currently pregnant 05/08/2019   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   MDD (major depressive disorder), recurrent severe, 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    As noted in HPI  Physical Exam Vital Signs  I have reviewed the triage vital signs BP 100/70 (BP Location: Right Arm)   Pulse (!) 101   Resp 16   LMP 03/26/2023   SpO2 98%   Physical Exam Vitals reviewed.  Constitutional:      General: She is not in acute distress.    Appearance: She is well-developed. She is not diaphoretic.  HENT:     Head: Normocephalic and atraumatic.     Right Ear: External ear normal.     Left Ear: External ear normal.     Nose: Nose normal.  Eyes:     General: No scleral icterus.    Conjunctiva/sclera: Conjunctivae normal.  Neck:     Trachea: Phonation normal.  Cardiovascular:     Rate and Rhythm: Normal rate and regular rhythm.  Pulmonary:     Effort: Pulmonary effort  is normal. No respiratory distress.     Breath sounds: No stridor.  Abdominal:     General: There is no distension.  Musculoskeletal:        General: Normal range of motion.     Cervical back: Normal range of motion.  Neurological:     Mental Status: She is alert and oriented to person, place, and time.     ED Results and Treatments Labs (all labs ordered are listed, but only abnormal results are displayed) Labs Reviewed  COMPREHENSIVE METABOLIC PANEL - Abnormal; Notable for the following components:      Result Value   Sodium 131 (*)    CO2 20 (*)    Glucose, Bld 107 (*)    Calcium 8.5 (*)    Total Protein 5.8 (*)    All other components within normal limits  CBC WITH DIFFERENTIAL/PLATELET - Abnormal; Notable for the following components:   WBC 17.9 (*)    Neutro Abs 15.5 (*)    Monocytes Absolute 1.1 (*)    Abs Immature Granulocytes 0.09 (*)    All other components within normal limits  ETHANOL  HCG, SERUM, QUALITATIVE  RAPID URINE DRUG SCREEN, HOSP PERFORMED                                                                                                                         EKG  EKG  Interpretation Date/Time:    Ventricular Rate:    PR Interval:    QRS Duration:    QT Interval:    QTC Calculation:   R Axis:      Text Interpretation:         Radiology No results found.  Medications Ordered in ED Medications - No data to display Procedures Procedures  (including critical care time) Medical Decision Making / ED Course   Medical Decision Making Amount and/or Complexity of Data Reviewed Labs: ordered. Decision-making details documented in ED Course.    Patient brought in for agitation requiring chemical restraints.  Initially not cooperating with history of physical.  No obvious signs of trauma noted on exam.  After several hours of monitoring, patient on did respond and declined any SI, HI, AVH.  She has no physical complaints.  Labs nonspecific.  CBC does show leukocytosis which may be related to demargination from physical stress.  No obvious signs of infection.  Metabolic panel without significant electrolyte derangements or renal sufficiency.  Patient moving all extremities.  Ambulated without complication.    Final Clinical Impression(s) / ED Diagnoses Final diagnoses:  Agitation   The patient appears reasonably screened and/or stabilized for discharge and I doubt any other medical condition or other [facility] requiring further screening, evaluation, or treatment in the ED at this time. I have discussed the findings, Dx and Tx plan with the patient/family who expressed understanding and agree(s) with the plan. Discharge instructions discussed at length. The patient/family was given strict return precautions who verbalized understanding of the instructions. No  further questions at time of discharge.  Disposition: Discharge  Condition: Good  ED Discharge Orders     None         Follow Up: Primary care provider  Call  to schedule an appointment for close follow up     This chart was dictated using voice recognition software.  Despite  best efforts to proofread,  errors can occur which can change the documentation meaning.    Nira Conn, MD 05/30/23 [phone]

## 2023-05-30 NOTE — ED Notes (Signed)
 Pt given d/c papers and educated on d/c instructions. Pt offered a bus pass however she declined. Pt threw papers back in this RNs face and said "that's not my fucking name and you can leave me the fuck alone." Security to bedside escorting pt out of the ED

## 2023-05-30 NOTE — ED Notes (Signed)
 Attempted to obtain urine sample. Pt filled urine cup w/ water.

## 2023-05-30 NOTE — ED Triage Notes (Addendum)
 BIBA  Per EMS: pt coming from home w/ c/o behavioral issues. Pt acting aggressive due to argument w/ boyfriend.  5mg  versed given en route  5mg  haldol given en route  In restraints upon arrival. Pt taken out of restraints upon moving to ED bed.  Pt asleep & calm in bed at this time.

## 2023-06-14 ENCOUNTER — Institutional Professional Consult (permissible substitution) (INDEPENDENT_AMBULATORY_CARE_PROVIDER_SITE_OTHER): Payer: MEDICAID | Admitting: Otolaryngology

## 2023-06-14 NOTE — Progress Notes (Deleted)
 ENT CONSULT:  Reason for Consult: ***   HPI: Discussed the use of AI scribe software for clinical note transcription with the patient, who gave verbal consent to proceed.  History of Present Illness             Records Reviewed:  ***    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HCC)    Hypertension    Memory loss 09/17/2013   Mental disorder    Sleep disturbance 09/17/2013    Past Surgical History:  Procedure Laterality Date   KIDNEY SURGERY     MOUTH SURGERY      Family History  Problem Relation Age of Onset   Healthy Mother    Healthy Father    Bipolar disorder Maternal Grandmother    Seizures Neg Hx    Parkinsonism Neg Hx    Multiple sclerosis Neg Hx    Dementia Neg Hx     Social History:  reports that she has never smoked. She has never used smokeless tobacco. She reports that she does not drink alcohol and does not use drugs.  Allergies:  Allergies  Allergen Reactions   Penicillins Other (See Comments)    Unknown reaction      Medications: I have reviewed the patient's current medications.  The PMH, PSH, Medications, Allergies, and SH were reviewed and updated.  ROS: Constitutional: Negative for fever, weight loss and weight gain. Cardiovascular: Negative for chest pain and dyspnea on exertion. Respiratory: Is not experiencing shortness of breath at rest. Gastrointestinal: Negative for nausea and vomiting. Neurological: Negative for headaches. Psychiatric: The patient is not nervous/anxious  There were no vitals taken for this visit. There is no height or weight on file to calculate BMI.  PHYSICAL EXAM:  Exam: General: Well-developed, well-nourished Communication and Voice: Clear pitch and clarity Respiratory Respiratory effort: Equal inspiration and expiration without stridor Cardiovascular Peripheral Vascular: Warm extremities with equal color/perfusion Eyes: No nystagmus with equal extraocular  motion bilaterally Neuro/Psych/Balance: Patient oriented to person, place, and time; Appropriate mood and affect; Gait is intact with no imbalance; Cranial nerves I-XII are intact Head and Face Inspection: Normocephalic and atraumatic without mass or lesion Palpation: Facial skeleton intact without bony stepoffs Salivary Glands: No mass or tenderness Facial Strength: Facial motility symmetric and full bilaterally ENT Pinna: External ear intact and fully developed External canal: Canal is patent with intact skin Tympanic Membrane: Clear and mobile External Nose: No scar or anatomic deformity Internal Nose: Septum is ***. No polyp, or purulence. Mucosal edema and erythema present.  Bilateral inferior turbinate hypertrophy.  Lips, Teeth, and gums: Mucosa and teeth intact and viable TMJ: No pain to palpation with full mobility Oral cavity/oropharynx: No erythema or exudate, no lesions present Nasopharynx: No mass or lesion with intact mucosa Hypopharynx: Intact mucosa without pooling of secretions Larynx Glottic: Full true vocal cord mobility without lesion or mass Supraglottic: Normal appearing epiglottis and AE folds Interarytenoid Space: Moderate pachydermia&edema Subglottic Space: Patent without lesion or edema Neck Neck and Trachea: Midline trachea without mass or lesion Thyroid: No mass or nodularity Lymphatics: No lymphadenopathy  Procedure: {lsofficescopes (Optional):32311}     Studies Reviewed:***  Assessment/Plan: No diagnosis found.  Assessment and Plan               Thank you for allowing me to participate in the care of this patient. Please do not hesitate to contact me with any questions or concerns.   Ashok Croon,  MD Otolaryngology [facility] Phone: [phone] Fax: [phone]    06/14/2023, 9:36 AM

## 2023-08-02 IMAGING — CR DG CHEST 2V
2 series · 2 of 2 positions shown · non-contrast
Comparison: Radiograph 05/20/2021

CLINICAL DATA: Chest pain

EXAM:
CHEST - 2 VIEW

[chest pa]
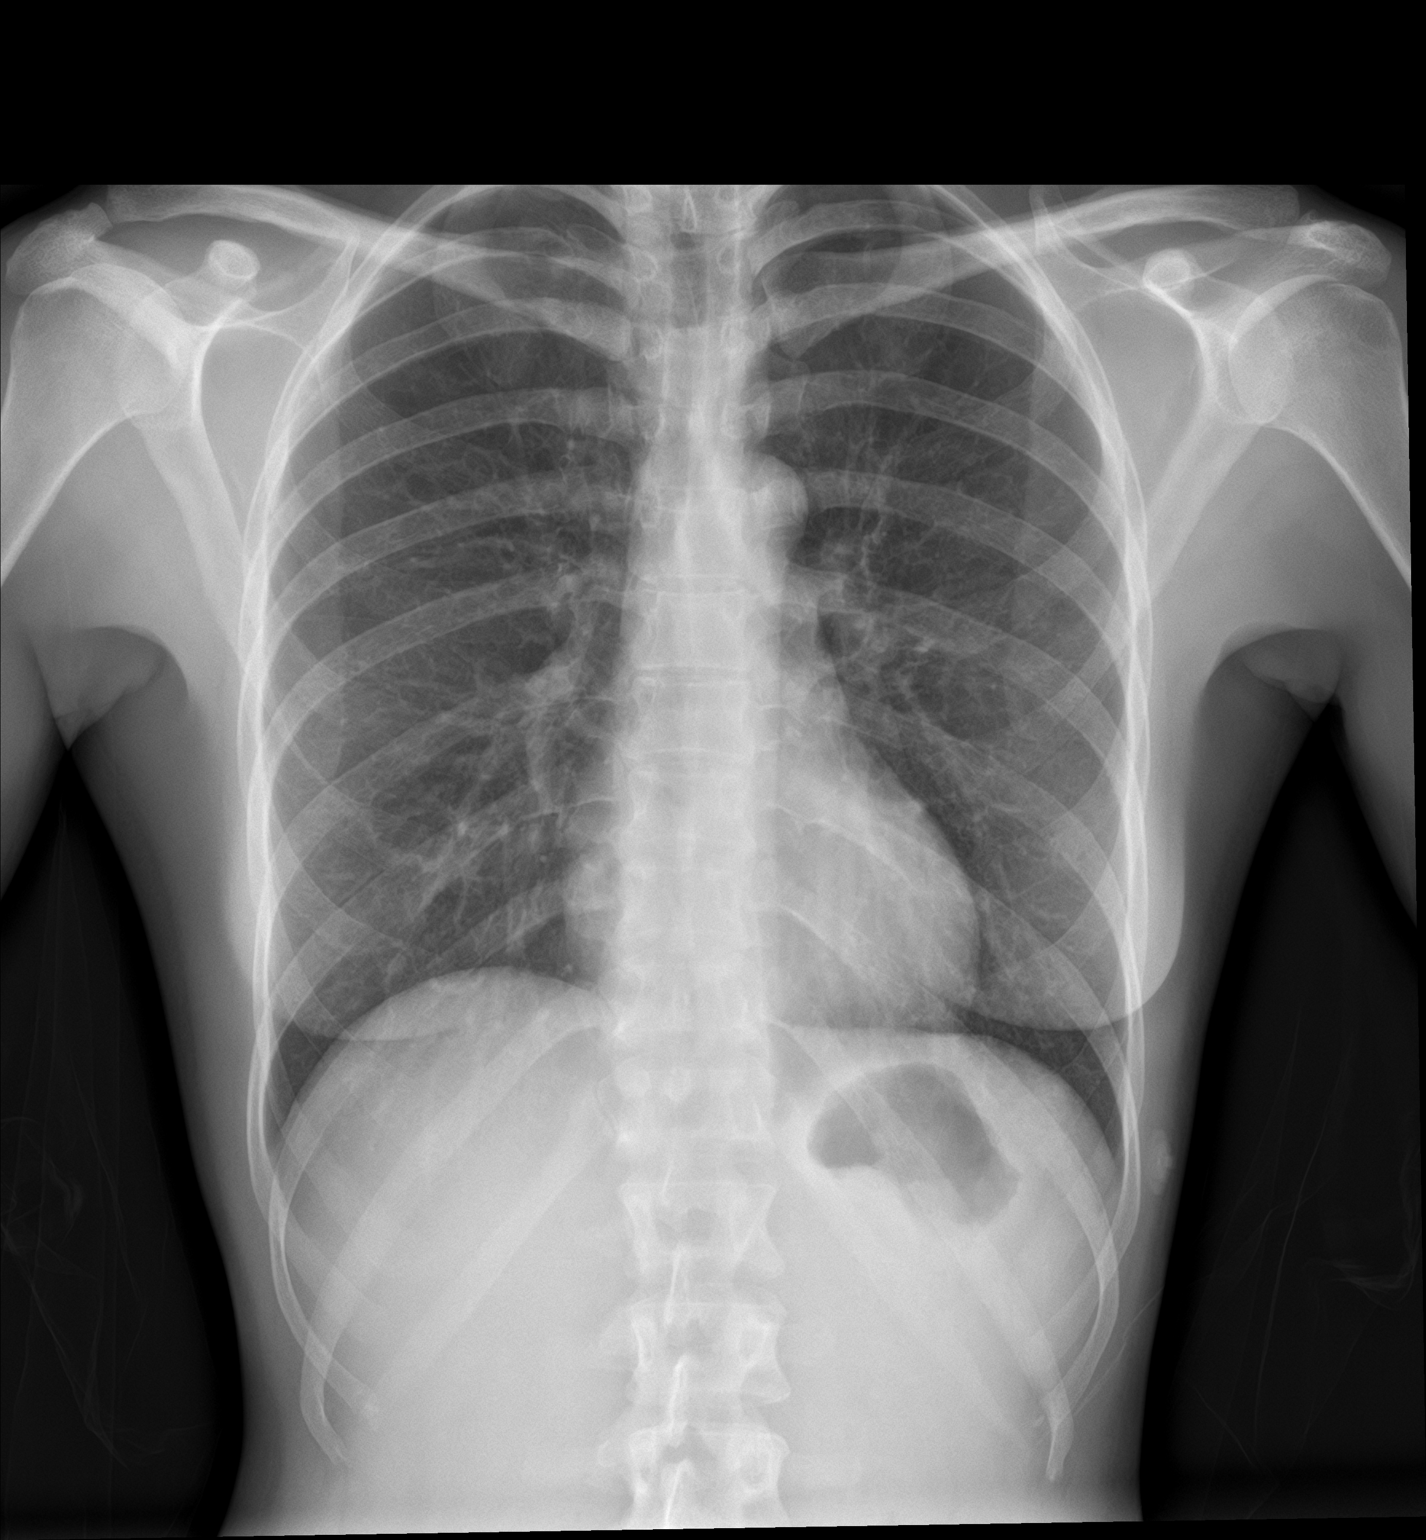

[chest lat]
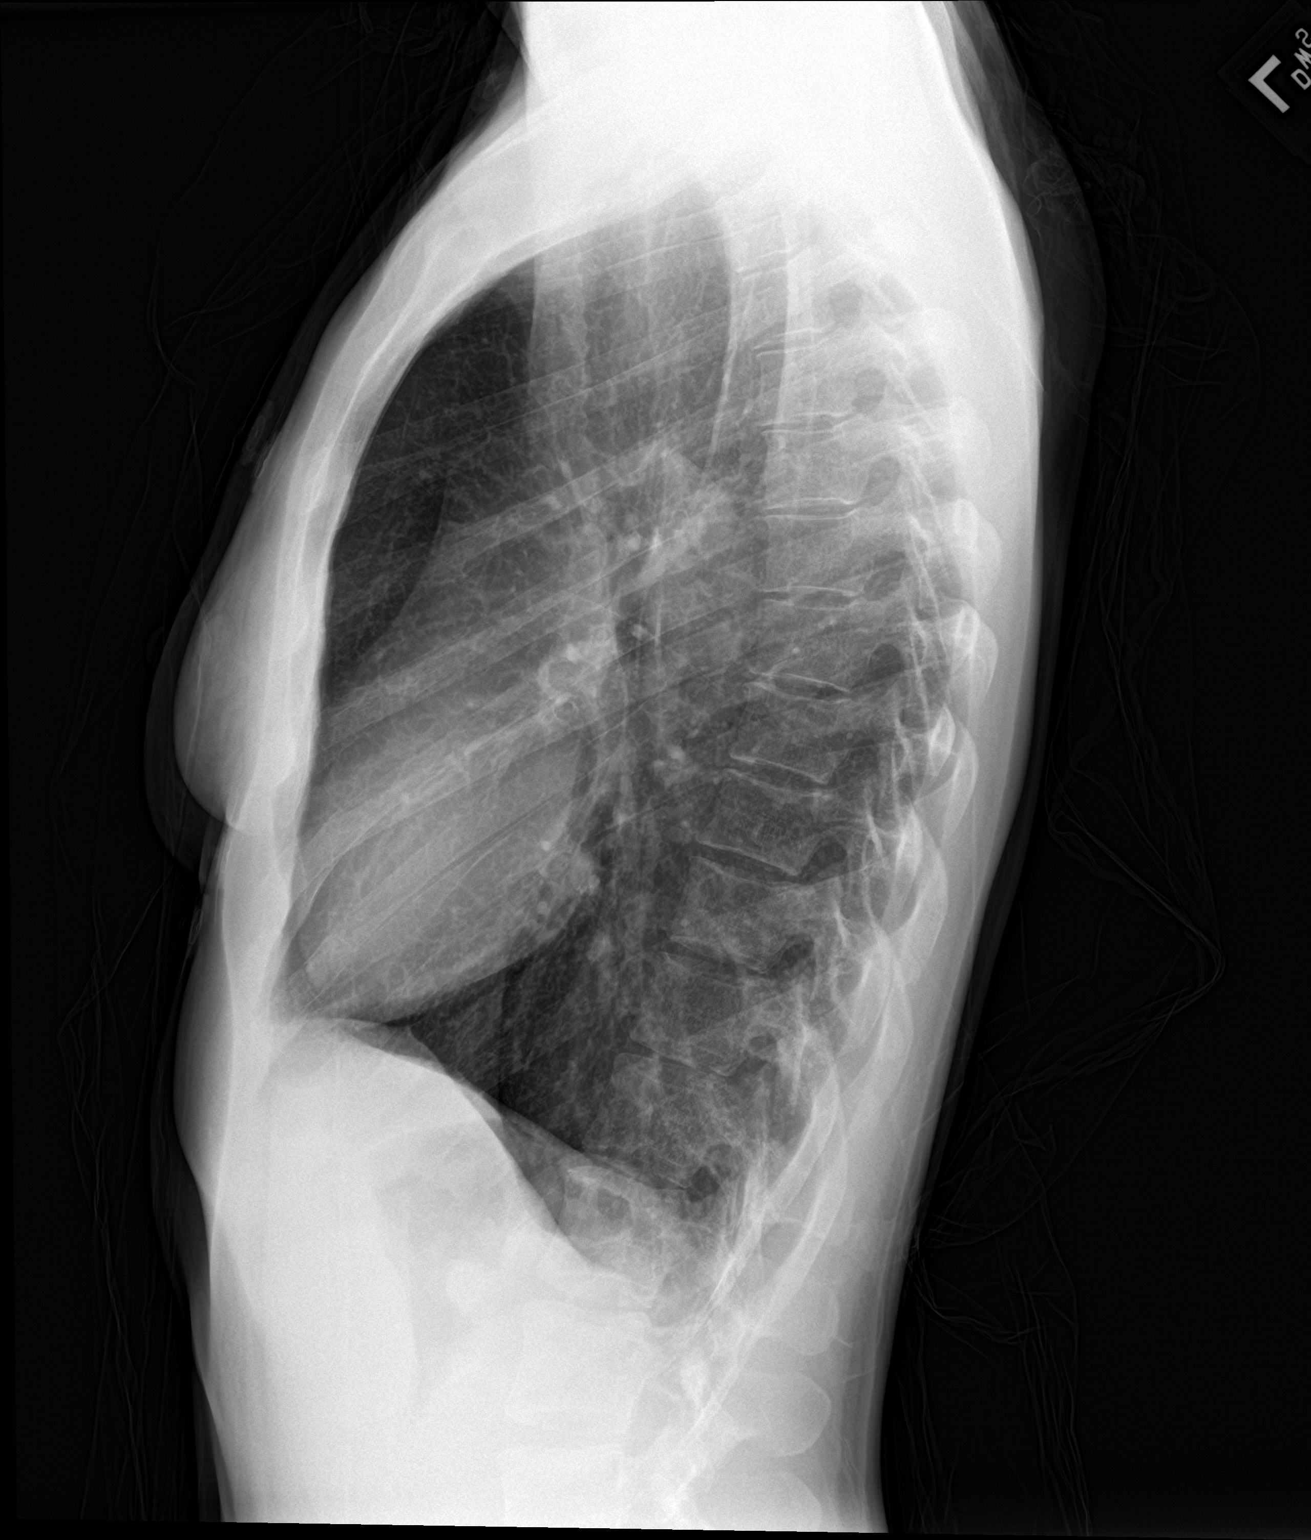

[2 of 2 positions shown; findings below may reference images not displayed]

FINDINGS: The heart size and mediastinal contours are within normal limits.No
focal airspace disease. No pleural effusion or pneumothorax.No acute
osseous abnormality.
IMPRESSION: No evidence of acute cardiopulmonary disease.

## 2023-08-02 IMAGING — US US PELVIS COMPLETE
1 series · 14 of 25 positions shown · non-contrast
Comparison: None.

CLINICAL DATA: Pelvic pain, vaginal bleeding

EXAM:
TRANSABDOMINAL ULTRASOUND OF PELVIS
DOPPLER ULTRASOUND OF OVARIES
TECHNIQUE: Transabdominal ultrasound examination of the pelvis was performed
including evaluation of the uterus, ovaries, adnexal regions, and
pelvic cul-de-sac.
Color and duplex Doppler ultrasound was utilized to evaluate blood
flow to the ovaries.

[Series 1: us pelvic complete w transvaginal and torsion righ · 14 of 71 slices shown]
[im 1/71]
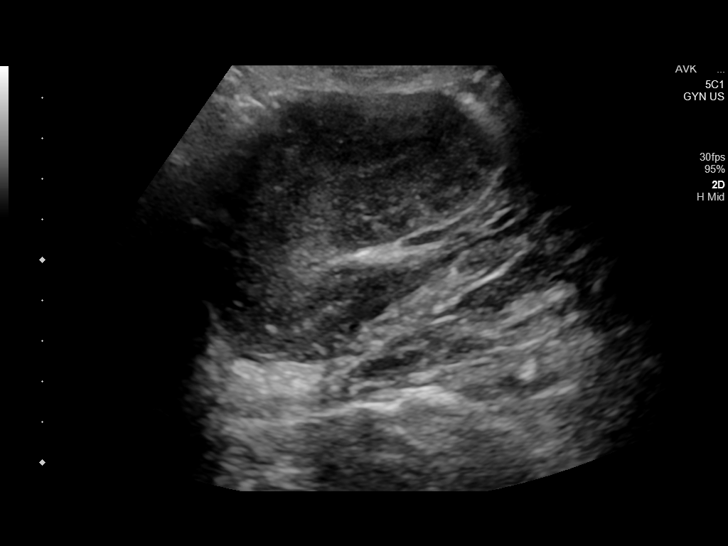
[im 6/71]
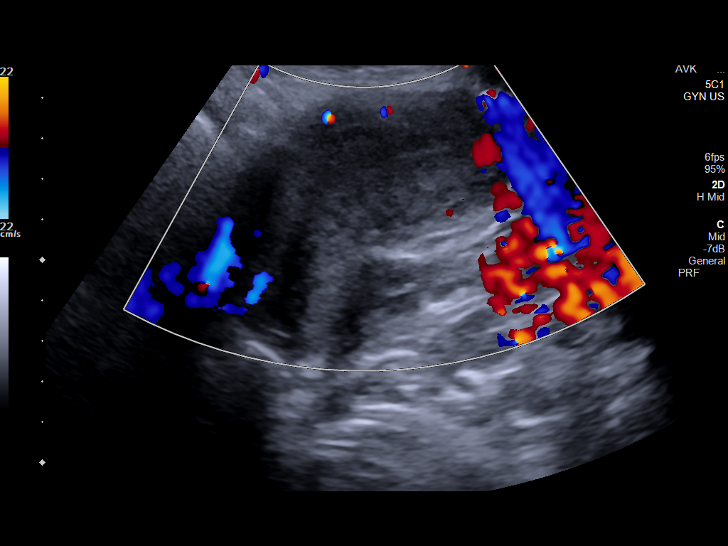
[im 12/71]
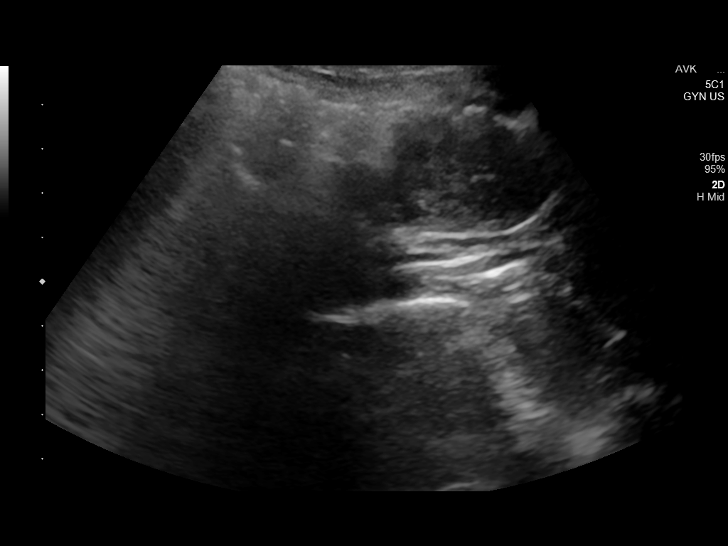
[im 18/71]
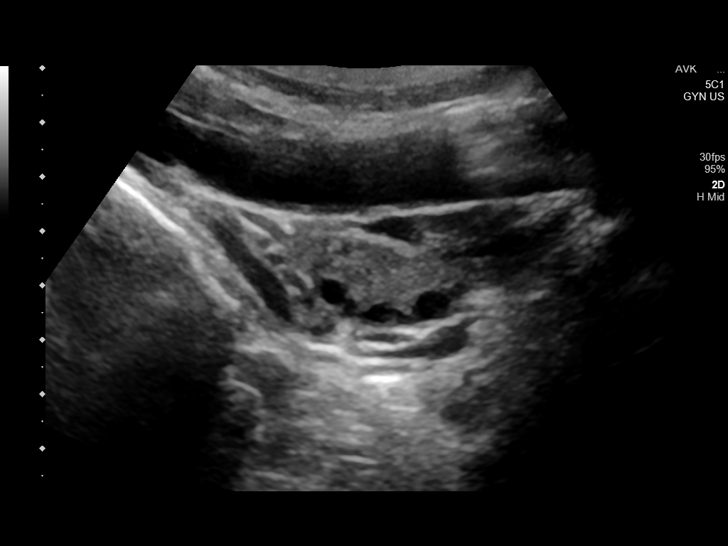
[im 24/71]
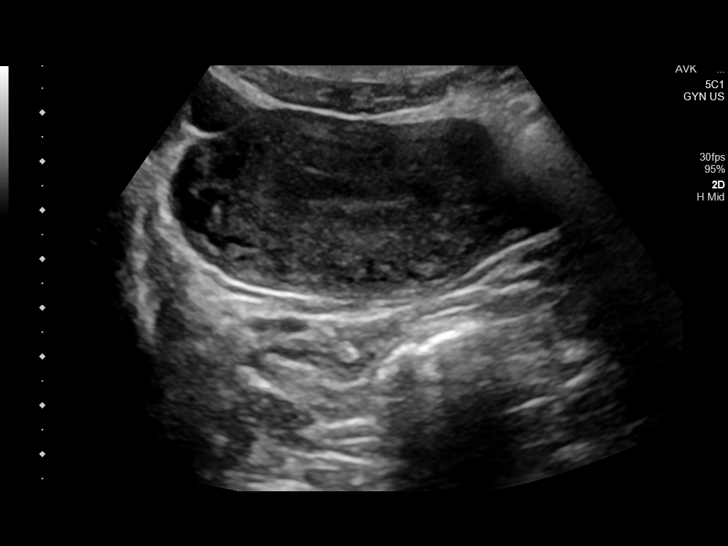
[im 27/71]
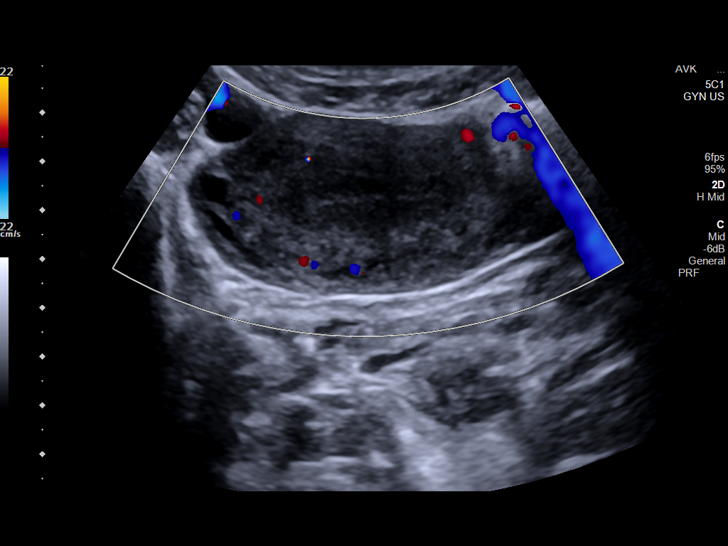
[im 33/71]
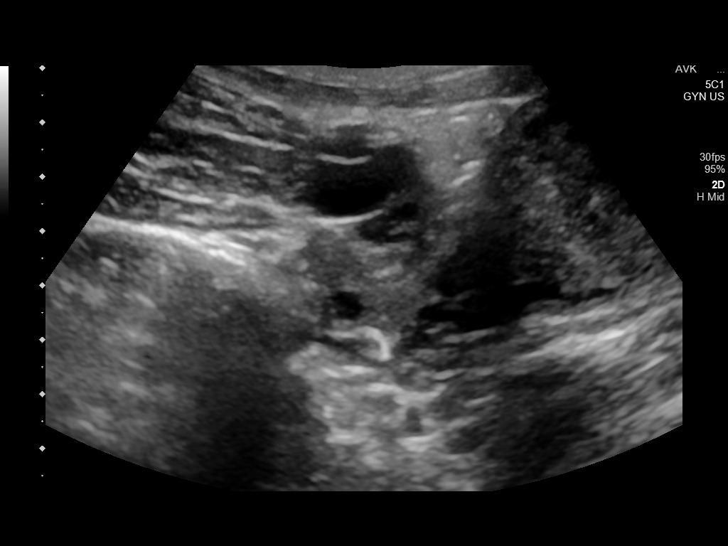
[im 38/71]
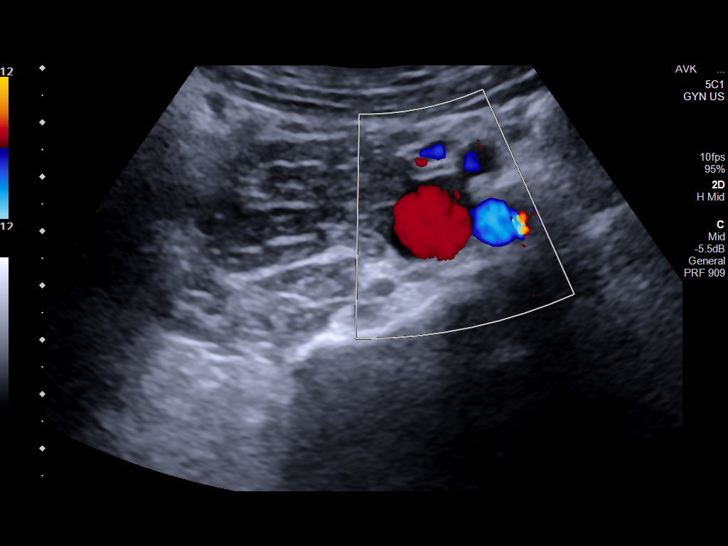
[im 44/71]
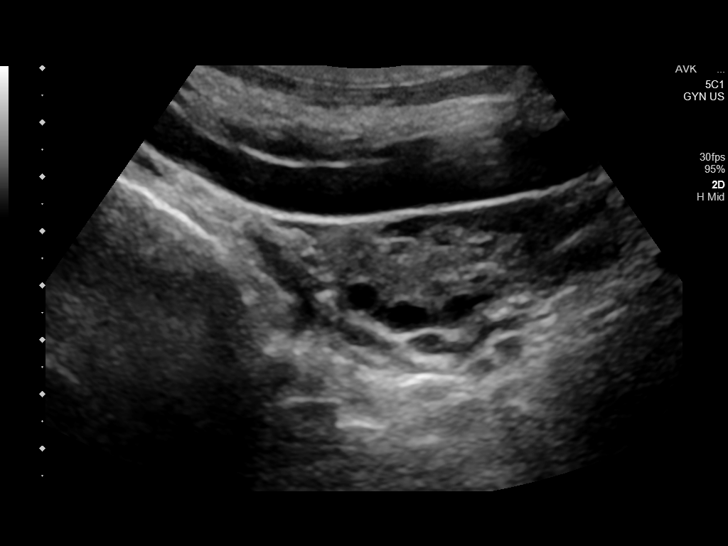
[im 47/71]
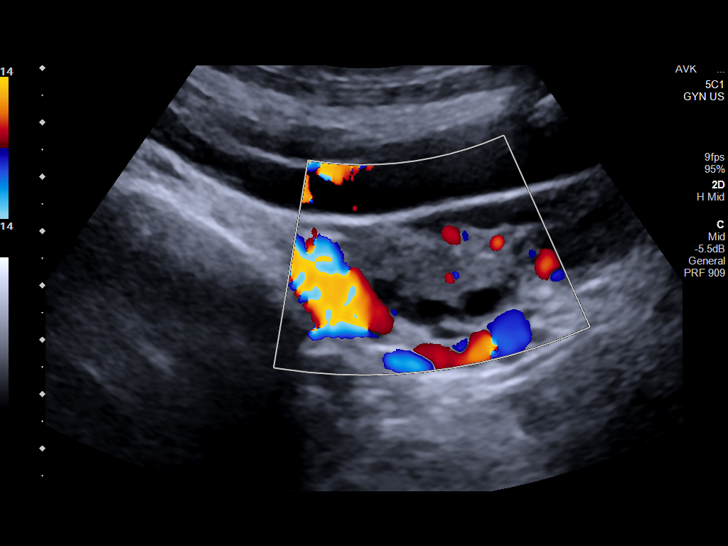
[im 53/71]
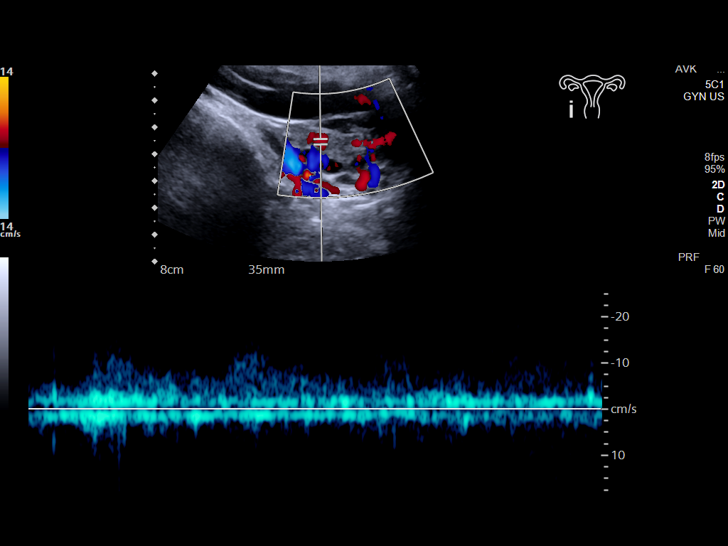
[im 59/71]
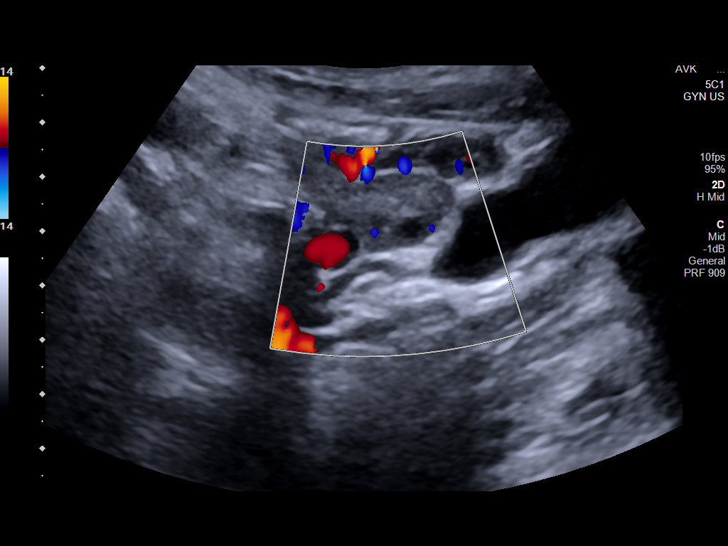
[im 65/71]
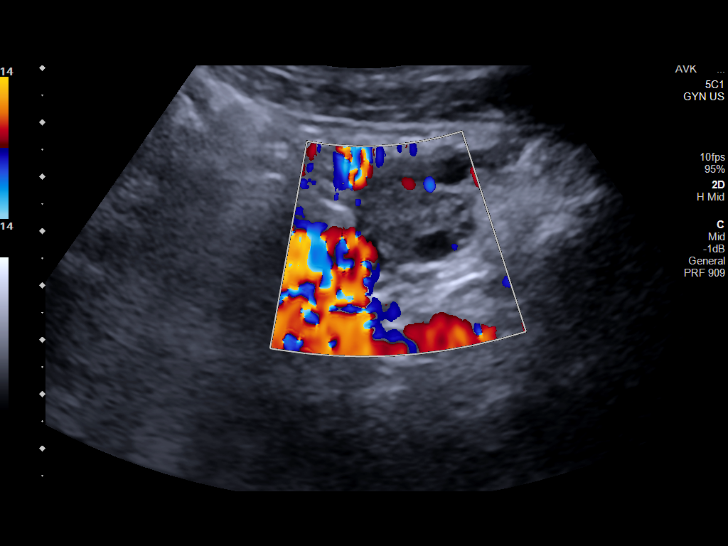
[im 71/71]
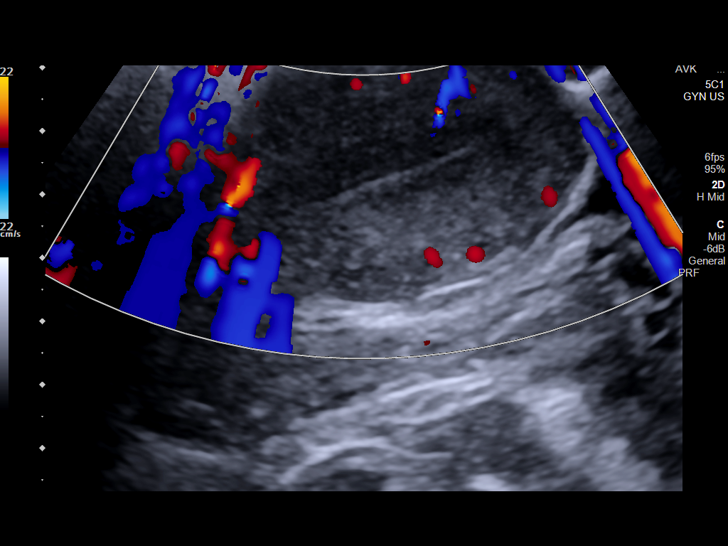

[14 of 25 positions shown; findings below may reference images not displayed]

FINDINGS: Uterus

Measurements: 8.3 x 4.4 x 6.9 cm = volume: 129.4 mL. No fibroids or
other mass visualized.

Endometrium

Thickness: 9 mm. This is normal for age. No focal abnormality
visualized.

Right ovary

Measurements: 3.8 x 1.8 x 1.8 cm = volume: 6.6 mL. Normal
appearance/no adnexal mass.

Left ovary

Measurements: 2.5 x 1.9 x 2.0 cm = volume: 3.5 mL. Normal
appearance/no adnexal mass.

Pulsed Doppler evaluation demonstrates normal low-resistance
arterial and venous waveforms in both ovaries.

Other: None
IMPRESSION: Normal pelvic ultrasound.

No evidence of ovarian torsion.

## 2024-01-10 ENCOUNTER — Other Ambulatory Visit: Payer: Self-pay | Admitting: Medical Genetics
